# Patient Record
Sex: Female | Born: 1935 | Race: White | Hispanic: No | Smoking: Never smoker
Health system: Southern US, Community
[De-identification: ages and names within clinical notes are randomized; demographics above are authoritative.]

## PROBLEM LIST (undated history)

## (undated) DIAGNOSIS — I4891 Unspecified atrial fibrillation: Secondary | ICD-10-CM

## (undated) DIAGNOSIS — M1990 Unspecified osteoarthritis, unspecified site: Secondary | ICD-10-CM

## (undated) DIAGNOSIS — I050 Rheumatic mitral stenosis: Secondary | ICD-10-CM

## (undated) DIAGNOSIS — F32A Depression, unspecified: Secondary | ICD-10-CM

## (undated) DIAGNOSIS — K8020 Calculus of gallbladder without cholecystitis without obstruction: Secondary | ICD-10-CM

## (undated) DIAGNOSIS — I350 Nonrheumatic aortic (valve) stenosis: Secondary | ICD-10-CM

## (undated) DIAGNOSIS — I959 Hypotension, unspecified: Secondary | ICD-10-CM

## (undated) DIAGNOSIS — D649 Anemia, unspecified: Secondary | ICD-10-CM

## (undated) HISTORY — DX: Calculus of gallbladder without cholecystitis without obstruction: K80.20

## (undated) HISTORY — DX: Anemia, unspecified: D64.9

## (undated) HISTORY — DX: Unspecified atrial fibrillation: I48.91

## (undated) HISTORY — DX: Rheumatic mitral stenosis: I05.0

## (undated) HISTORY — PX: COLONOSCOPY: SHX174

## (undated) HISTORY — DX: Hypotension, unspecified: I95.9

## (undated) HISTORY — DX: Unspecified osteoarthritis, unspecified site: M19.90

## (undated) HISTORY — DX: Nonrheumatic aortic (valve) stenosis: I35.0

## (undated) HISTORY — DX: Depression, unspecified: F32.A

---

## 2003-08-14 HISTORY — PX: VAGINAL HYSTERECTOMY: SUR661

## 2006-08-13 HISTORY — PX: OTHER SURGICAL HISTORY: SHX169

## 2006-08-13 HISTORY — PX: TOTAL HIP ARTHROPLASTY: SHX124

## 2007-08-14 HISTORY — PX: HIP SURGERY: SHX245

## 2011-08-14 HISTORY — PX: MELANOMA EXCISION: SHX5266

## 2014-08-13 HISTORY — PX: ROTATOR CUFF REPAIR: SHX139

## 2014-08-13 HISTORY — PX: FRACTURE SURGERY: SHX138

## 2019-09-14 HISTORY — PX: ERCP: SHX60

## 2020-10-21 ENCOUNTER — Other Ambulatory Visit: Payer: Self-pay

## 2020-10-21 ENCOUNTER — Ambulatory Visit (INDEPENDENT_AMBULATORY_CARE_PROVIDER_SITE_OTHER): Payer: Medicare Other | Admitting: Orthopedic Surgery

## 2020-10-21 VITALS — BP 118/76 | Temp 97.5°F | Resp 20 | Ht 62.0 in | Wt 77.2 lb

## 2020-10-21 DIAGNOSIS — I480 Paroxysmal atrial fibrillation: Secondary | ICD-10-CM

## 2020-10-21 DIAGNOSIS — R2681 Unsteadiness on feet: Secondary | ICD-10-CM | POA: Diagnosis not present

## 2020-10-21 DIAGNOSIS — R636 Underweight: Secondary | ICD-10-CM

## 2020-10-21 DIAGNOSIS — I342 Nonrheumatic mitral (valve) stenosis: Secondary | ICD-10-CM

## 2020-10-21 DIAGNOSIS — R296 Repeated falls: Secondary | ICD-10-CM

## 2020-10-21 DIAGNOSIS — R630 Anorexia: Secondary | ICD-10-CM

## 2020-10-21 DIAGNOSIS — K5901 Slow transit constipation: Secondary | ICD-10-CM

## 2020-10-21 DIAGNOSIS — R5383 Other fatigue: Secondary | ICD-10-CM

## 2020-10-21 DIAGNOSIS — M545 Low back pain, unspecified: Secondary | ICD-10-CM

## 2020-10-21 DIAGNOSIS — I350 Nonrheumatic aortic (valve) stenosis: Secondary | ICD-10-CM | POA: Diagnosis not present

## 2020-10-21 DIAGNOSIS — I5042 Chronic combined systolic (congestive) and diastolic (congestive) heart failure: Secondary | ICD-10-CM

## 2020-10-21 DIAGNOSIS — G8929 Other chronic pain: Secondary | ICD-10-CM

## 2020-10-21 DIAGNOSIS — I959 Hypotension, unspecified: Secondary | ICD-10-CM

## 2020-10-21 DIAGNOSIS — D508 Other iron deficiency anemias: Secondary | ICD-10-CM

## 2020-10-21 DIAGNOSIS — R1084 Generalized abdominal pain: Secondary | ICD-10-CM

## 2020-10-21 MED ORDER — PANTOPRAZOLE SODIUM 40 MG PO TBEC: 40.0000 mg | DELAYED_RELEASE_TABLET | Freq: Two times a day (BID) | ORAL | 3 refills | Status: DC

## 2020-10-21 NOTE — Patient Instructions (Signed)
Increase Protonix to twice daily for reflux Outpatient PT ordered Please take blood pressure twice daily for 1 week I have medpass shakes if you would like Add ice cream to boost shakes  Food Choices for Gastroesophageal Reflux Disease, Adult When you have gastroesophageal reflux disease (GERD), the foods you eat and your eating habits are very important. Choosing the right foods can help ease your discomfort. Think about working with a food expert (dietitian) to help you make good choices. What are tips for following this plan? Reading food labels  Look for foods that are low in saturated fat. Foods that may help with your symptoms include: ? Foods that have less than 5% of daily value (DV) of fat. ? Foods that have 0 grams of trans fat. Cooking  Do not fry your food.  Cook your food by baking, steaming, grilling, or broiling. These are all methods that do not need a lot of fat for cooking.  To add flavor, try to use herbs that are low in spice and acidity. Meal planning  Choose healthy foods that are low in fat, such as: ? Fruits and vegetables. ? Whole grains. ? Low-fat dairy products. ? Lean meats, fish, and poultry.  Eat small meals often instead of eating 3 large meals each day. Eat your meals slowly in a place where you are relaxed. Avoid bending over or lying down until 2-3 hours after eating.  Limit high-fat foods such as fatty meats or fried foods.  Limit your intake of fatty foods, such as oils, butter, and shortening.  Avoid the following as told by your doctor: ? Foods that cause symptoms. These may be different for different people. Keep a food diary to keep track of foods that cause symptoms. ? Alcohol. ? Drinking a lot of liquid with meals. ? Eating meals during the 2-3 hours before bed.   Lifestyle  Stay at a healthy weight. Ask your doctor what weight is healthy for you. If you need to lose weight, work with your doctor to do so safely.  Exercise for at  least 30 minutes on 5 or more days each week, or as told by your doctor.  Wear loose-fitting clothes.  Do not smoke or use any products that contain nicotine or tobacco. If you need help quitting, ask your doctor.  Sleep with the head of your bed higher than your feet. Use a wedge under the mattress or blocks under the bed frame to raise the head of the bed.  Chew sugar-free gum after meals. What foods should eat? Eat a healthy, well-balanced diet of fruits, vegetables, whole grains, low-fat dairy products, lean meats, fish, and poultry. Each person is different. Foods that may cause symptoms in one person may not cause any symptoms in another person. Work with your doctor to find foods that are safe for you. The items listed above may not be a complete list of what you can eat and drink. Contact a food expert for more options.   What foods should I avoid? Limiting some of these foods may help in managing the symptoms of GERD. Everyone is different. Talk with a food expert or your doctor to help you find the exact foods to avoid, if any. Fruits Any fruits prepared with added fat. Any fruits that cause symptoms. For some people, this may include citrus fruits, such as oranges, grapefruit, pineapple, and lemons. Vegetables Deep-fried vegetables. [place] fries. Any vegetables prepared with added fat. Any vegetables that cause symptoms. For some people,  this may include tomatoes and tomato products, chili peppers, onions and garlic, and horseradish. Grains Pastries or quick breads with added fat. Meats and other proteins High-fat meats, such as fatty beef or pork, hot dogs, ribs, ham, sausage, salami, and bacon. Fried meat or protein, including fried fish and fried chicken. Nuts and nut butters, in large amounts. Dairy Whole milk and chocolate milk. Sour cream. Cream. Ice cream. Cream cheese. Milkshakes. Fats and oils Butter. Margarine. Shortening. Ghee. Beverages Coffee and tea, with or  without caffeine. Carbonated beverages. Sodas. Energy drinks. Fruit juice made with acidic fruits, such as orange or grapefruit. Tomato juice. Alcoholic drinks. Sweets and desserts Chocolate and cocoa. Donuts. Seasonings and condiments Pepper. Peppermint and spearmint. Added salt. Any condiments, herbs, or seasonings that cause symptoms. For some people, this may include curry, hot sauce, or vinegar-based salad dressings. The items listed above may not be a complete list of what you should not eat and drink. Contact a food expert for more options. Questions to ask your doctor Diet and lifestyle changes are often the first steps that are taken to manage symptoms of GERD. If diet and lifestyle changes do not help, talk with your doctor about taking medicines. Where to find more information  International Foundation for Gastrointestinal Disorders: aboutgerd.org Summary  When you have GERD, food and lifestyle choices are very important in easing your symptoms.  Eat small meals often instead of 3 large meals a day. Eat your meals slowly and in a place where you are relaxed.  Avoid bending over or lying down until 2-3 hours after eating.  Limit high-fat foods such as fatty meats or fried foods. This information is not intended to replace advice given to you by your health care provider. Make sure you discuss any questions you have with your health care provider. Document Revised: 02/08/2020 Document Reviewed: 02/08/2020 Elsevier Patient Education  2021 [employer].
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
## 2020-10-22 LAB — CBC WITH DIFFERENTIAL/PLATELET
Absolute Monocytes: 429 cells/uL (ref 200–950)
Basophils Absolute: 33 cells/uL (ref 0–200)
Basophils Relative: 0.5 %
Eosinophils Absolute: 33 cells/uL (ref 15–500)
Eosinophils Relative: 0.5 %
HCT: 32.2 % — ABNORMAL LOW (ref 35.0–45.0)
Hemoglobin: 10.9 g/dL — ABNORMAL LOW (ref 11.7–15.5)
Lymphs Abs: 1001 cells/uL (ref 850–3900)
MCH: 31.3 pg (ref 27.0–33.0)
MCHC: 33.9 g/dL (ref 32.0–36.0)
MCV: 92.5 fL (ref 80.0–100.0)
MPV: 11.4 fL (ref 7.5–12.5)
Monocytes Relative: 6.6 %
Neutro Abs: 5005 cells/uL (ref 1500–7800)
Neutrophils Relative %: 77 %
Platelets: 179 10*3/uL (ref 140–400)
RBC: 3.48 10*6/uL — ABNORMAL LOW (ref 3.80–5.10)
RDW: 13.9 % (ref 11.0–15.0)
Total Lymphocyte: 15.4 %
WBC: 6.5 10*3/uL (ref 3.8–10.8)

## 2020-10-22 LAB — BASIC METABOLIC PANEL
BUN/Creatinine Ratio: 25 (calc) — ABNORMAL HIGH (ref 6–22)
BUN: 25 mg/dL (ref 7–25)
CO2: 26 mmol/L (ref 20–32)
Calcium: 8.6 mg/dL (ref 8.6–10.4)
Chloride: 97 mmol/L — ABNORMAL LOW (ref 98–110)
Creat: 1.02 mg/dL — ABNORMAL HIGH (ref 0.60–0.88)
Glucose, Bld: 164 mg/dL — ABNORMAL HIGH (ref 65–139)
Potassium: 4 mmol/L (ref 3.5–5.3)
Sodium: 136 mmol/L (ref 135–146)

## 2020-10-25 ENCOUNTER — Telehealth: Payer: Self-pay | Admitting: *Deleted

## 2020-10-25 ENCOUNTER — Encounter: Payer: Self-pay | Admitting: Orthopedic Surgery

## 2020-10-25 DIAGNOSIS — R630 Anorexia: Secondary | ICD-10-CM | POA: Insufficient documentation

## 2020-10-25 DIAGNOSIS — I342 Nonrheumatic mitral (valve) stenosis: Secondary | ICD-10-CM | POA: Insufficient documentation

## 2020-10-25 DIAGNOSIS — D508 Other iron deficiency anemias: Secondary | ICD-10-CM | POA: Insufficient documentation

## 2020-10-25 DIAGNOSIS — R5383 Other fatigue: Secondary | ICD-10-CM | POA: Insufficient documentation

## 2020-10-25 DIAGNOSIS — R1084 Generalized abdominal pain: Secondary | ICD-10-CM | POA: Insufficient documentation

## 2020-10-25 DIAGNOSIS — I959 Hypotension, unspecified: Secondary | ICD-10-CM | POA: Insufficient documentation

## 2020-10-25 DIAGNOSIS — K5901 Slow transit constipation: Secondary | ICD-10-CM | POA: Insufficient documentation

## 2020-10-25 DIAGNOSIS — I5042 Chronic combined systolic (congestive) and diastolic (congestive) heart failure: Secondary | ICD-10-CM | POA: Insufficient documentation

## 2020-10-25 DIAGNOSIS — R636 Underweight: Secondary | ICD-10-CM | POA: Insufficient documentation

## 2020-10-25 DIAGNOSIS — G8929 Other chronic pain: Secondary | ICD-10-CM | POA: Insufficient documentation

## 2020-10-25 DIAGNOSIS — R296 Repeated falls: Secondary | ICD-10-CM | POA: Insufficient documentation

## 2020-10-25 DIAGNOSIS — I480 Paroxysmal atrial fibrillation: Secondary | ICD-10-CM | POA: Insufficient documentation

## 2020-10-25 DIAGNOSIS — R2681 Unsteadiness on feet: Secondary | ICD-10-CM | POA: Insufficient documentation

## 2020-10-25 DIAGNOSIS — I350 Nonrheumatic aortic (valve) stenosis: Secondary | ICD-10-CM | POA: Insufficient documentation

## 2020-10-25 NOTE — Telephone Encounter (Signed)
Judeth Cornfield, daughter called and stated that she was returning your call regarding some questions you had with the Hospital Bed and 3n1 toilet.  Daughter stated that you can call her at #[phone]

## 2020-10-26 ENCOUNTER — Telehealth: Payer: Self-pay

## 2020-10-26 NOTE — Telephone Encounter (Signed)
Drinda Butts from [employer] [facility] called to ask for the after visit summary which the patient was seen for and the need for a hospital bed was discussed and the order was placed I printed the Visit Summary and faxed it to [facility]  Fax# [phone] which was left on the voice message I listen too

## 2020-10-27 ENCOUNTER — Telehealth: Payer: Self-pay | Admitting: *Deleted

## 2020-10-27 NOTE — Telephone Encounter (Signed)
Dusty with [facility] [phone] called in regards to the Hospital Bed and stated that your OV note needs to include a Narrative that states:  "Patient requires frequent repositioning of the body in ways that Cannot be achieved with a standard bed or bed wedge."   Needs it faxed to [facility] Fax: [phone]

## 2020-10-27 NOTE — Telephone Encounter (Signed)
Updated notes faxed to [facility] Fax: [phone]

## 2020-10-27 NOTE — Telephone Encounter (Signed)
Received fax letter from daughter Aralyn Nowak #[phone] requesting a Health Care Provider Certification to be filled out for her job to help care for patient (form attached).  Needs to be filled out to allow her to take days off work intermittently to help take care of, assist, hospital visits and attend doctor's appointments for a period of one year for patient (mother). Wants filled out and emailed back to her at [email or web address]  Placed paperwork in Amy's Folder to review, fill out and sign.   [facility] Fax: [phone] #:[phone] Case ID: [id number] TC

## 2020-10-27 NOTE — Telephone Encounter (Signed)
Note amended to include phrase provided- you may re fax to them- thank you for your help!

## 2020-10-31 ENCOUNTER — Telehealth: Payer: Self-pay | Admitting: *Deleted

## 2020-10-31 NOTE — Telephone Encounter (Signed)
Talked with daughter Judeth Cornfield about restarting Megace and trying high calorie foods. Treatment options also discussed. Advised to notify PCP if interventions are unsuccessful.

## 2020-10-31 NOTE — Telephone Encounter (Signed)
Patient called and stated that patient has been having trouble eating over that last 4 days. Stated that doubling the Prilosec doesn't seem to be working. Stated that she can't keep anything down except Boost and Applesauce. Daughter stated that patient looks forward to eating and wants to eat but can't.  Daughter is wondering if this is stemming from her Gallbladder.  Wants to know if you have anymore recommendations.  Please Advise.

## 2020-11-02 DIAGNOSIS — Z029 Encounter for administrative examinations, unspecified: Secondary | ICD-10-CM

## 2020-11-03 NOTE — Telephone Encounter (Signed)
Daughter, Selena Batten, called and left message on Clinical intake and stated that she was calling to check the status of her paperwork.  Stated that it was due today and was wondering if you could fax it back to her work today.

## 2020-11-03 NOTE — Telephone Encounter (Signed)
I faxed paperwork this AM to [facility].

## 2020-11-03 NOTE — Telephone Encounter (Signed)
Received paperwork. Called and spoke with daughter Selena Batten and she requested paperwork to be faxed to Fax: [phone]. Faxed.  Call transferred to [facility] to collect the $25 form charge.   Copy sent to scanning.

## 2020-11-04 ENCOUNTER — Ambulatory Visit (INDEPENDENT_AMBULATORY_CARE_PROVIDER_SITE_OTHER): Payer: Medicare Other | Admitting: Internal Medicine

## 2020-11-04 ENCOUNTER — Encounter: Payer: Self-pay | Admitting: Internal Medicine

## 2020-11-04 ENCOUNTER — Other Ambulatory Visit: Payer: Self-pay

## 2020-11-04 VITALS — BP 116/60 | HR 69 | Ht 62.0 in | Wt 75.2 lb

## 2020-11-04 DIAGNOSIS — I509 Heart failure, unspecified: Secondary | ICD-10-CM | POA: Diagnosis not present

## 2020-11-04 NOTE — Progress Notes (Addendum)
Cardiology Office Note:    Date:  11/04/2020   ID:  Jaclyn Berry, DOB 1927-01-29, MRN [id number]  PCP:  [name], NP   [facility]  Cardiologist:  [name], MD  Advanced Practice Provider:  No care team member to display Electrophysiologist:  None       CC: Establishing care Consulted for the evaluation of PAF at the behest of [place], [place] E, NP  History of Present Illness:    Jaclyn Berry is a 87 y.o. 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  Patient notes that she is feeling week.  Has had no chest pain, chest pressure, chest tightness, chest stinging. Doesn't remember prior ischemic work up but only not AF and valve issues. The discomfort patient feels is dysphagia that occurs with even small amounts of food.  Patient exertion is minimal in the setting of significant deconditioning.  No shortness of breath, DOE on her home bumex.  No PND or orthopnea. No syncope or near syncope. Notes  no palpitations or funny heart beats her her atrial fibrillation  Patient reports prior cardiac testing including many prios echos, negative stress test in the past, denies poast heart catheterizations,  cardioversion, or ablations.   Past Medical History:  Diagnosis Date  . A-fib (HCC)   . Anemia   . Aortic valve stenosis   . Gall stones   . Low BP   . Mitral valve stenosis     Past Surgical History:  Procedure Laterality Date  . FRACTURE SURGERY  2016   Broken Arm  . HIP SURGERY  2009   Replacement repaired    . MELANOMA EXCISION  2013   right leg  . mirtral valve repair  2008  .  ROTATOR CUFF REPAIR  2016  . TOTAL HIP ARTHROPLASTY  2008  . VAGINAL HYSTERECTOMY  2005    Current Medications: Current Meds  Medication Sig  . acetaminophen (TYLENOL) 650 MG CR tablet Take 650 mg by mouth every 12 (twelve) hours as needed for pain.  Marland Kitchen amitriptyline (ELAVIL) 50 MG tablet Take 25 mg by mouth at bedtime.  Marland Kitchen aspirin EC 81 MG tablet Take 81 mg by mouth daily. Swallow whole.  . bumetanide (BUMEX) 1 MG tablet Take 1 mg by mouth daily.  . digoxin (LANOXIN) 0.125 MG tablet Take 0.125 mg by mouth daily.  . ferrous sulfate 325 (65 FE) MG tablet Take 325 mg by mouth daily with breakfast.  . HYDROcodone-acetaminophen (NORCO) 7.5-325 MG tablet Take 1 tablet by mouth as needed.  . megestrol (MEGACE) 40 MG/ML suspension Take by mouth daily. Take 10 ml by mouth daily  . mirtazapine (REMERON) 30 MG tablet Take 30 mg by mouth at bedtime.  . Multiple Vitamins-Minerals (PRESERVISION AREDS 2+MULTI VIT PO) Take 1 tablet by mouth at bedtime.  . ondansetron (ZOFRAN) 4 MG tablet Take 4 mg by mouth 3 (three) times daily as needed for nausea or vomiting.  . pantoprazole (PROTONIX) 40 MG tablet Take 1 tablet (40 mg total) by mouth 2 (two) times daily.  . potassium chloride (KLOR-CON) 20 MEQ packet Take 20 mEq by mouth  daily.  . senna-docusate (SENOKOT-S) 8.6-50 MG tablet Take 2 tablets by mouth daily.  . sildenafil (REVATIO) 20 MG tablet Take 20 mg by mouth 2 (two) times daily.  . traMADol (ULTRAM) 50 MG tablet Take 50 mg by mouth daily.  . vitamin B-12 (CYANOCOBALAMIN) 1000 MCG tablet Take 1,000 mcg by mouth daily.     Allergies:   Morphine and related and Sulfa antibiotics   Social History   Socioeconomic History  . Marital status: Widowed    Spouse name: Not on file  . Number of children: Not on file  . Years of education: Not on file  . Highest education level: Not on file  Occupational History  . Not on file  Tobacco Use  . Smoking status: Never Smoker  . Smokeless tobacco: Never  Used  Vaping Use  . Vaping Use: Never used  Substance and Sexual Activity  . Alcohol use: Never  . Drug use: Never  . Sexual activity: Not on file  Other Topics Concern  . Not on file  Social History Narrative   Diet: Regular      Do you drink/ eat things with caffeine? No always drank coffee but not now      Marital status:  Widowed                              What year were you married ? 1954      Do you live in a house, apartment,assistred living, condo, trailer, etc.)? House      Is it one or more stories? 2      How many persons live in your home ? 4      Do you have any pets in your home ?(please list) Dog, Cat      Highest Level of education completed: High School      Current or past profession: Book Publishing rights manager       Do you exercise?  No                            Type & how often ------------      ADVANCED DIRECTIVES (Please bring copies)      Do you have a living will? Yes      Do you have a DNR form? Yes                      If not, do you want to discuss one?       Do you have signed POA?HPOA forms?   Yes              If so, please bring to your appointment      FUNCTIONAL STATUS- To be completed by Spouse / child / Staff       Do you have difficulty bathing or dressing yourself ? Yes      Do you have difficulty preparing food or eating ? Yes      Do you have difficulty managing your mediation ? Yes      Do you have difficulty managing your finances ? Yes      Do you have difficulty affording your medication ? No      Social Determinants of Health   Financial Resource Strain: Not on file  Food Insecurity: Not on file  Transportation Needs: Not on file  Physical Activity: Not  on file  Stress: Not on file  Social Connections: Not on file     Family History: The patient's family history includes COPD in her mother; Emphysema in her mother; Stroke in her father.  ROS:   Please see the history of present illness.     All  other systems reviewed and are negative.  EKGs/Labs/Other Studies Reviewed:    The following studies were reviewed today:  EKG:  EKG is  ordered today.  The ekg ordered today demonstrates  11/04/20"  AF with PVCs rate controlled at ~ 70  Recent Labs: 10/21/2020: BUN 25; Creat 1.02; Hemoglobin 10.9; Platelets 179; Potassium 4.0; Sodium 136  Recent Lipid Panel No results found for: CHOL, TRIG, HDL, CHOLHDL, VLDL, LDLCALC, LDLDIRECT  Addendum OSH: Outside Hospital  or Outside Clinic Studies (OSH):  Date: 10/02/19 Cholesterol 130 HDL 72 LDL 40 Tgs 90 Dig level 1.0 (10/07/19) Confirmed 10/02/19 admission was for rhabdo   Risk Assessment/Calculations:    CHA2DS2-VASc Score = 4  This indicates a 4.8% annual risk of stroke. The patient's score is based upon: CHF History: Yes HTN History: No Diabetes History: No Stroke History: No Vascular Disease History: No Age Score: 2 Gender Score: 1      Physical Exam:    VS:  BP 116/60   Pulse 69   Ht 5\' 2"  (1.575 m)   Wt 75 lb 3.2 oz (34.1 kg)   BMI 13.75 kg/m     Wt Readings from Last 3 Encounters:  11/04/20 75 lb 3.2 oz (34.1 kg)  10/21/20 77 lb 3.2 oz (35 kg)     ZOX:WRUEA, elderly woman in no acute distress HEENT: Normal NECK: No JVD; No carotid bruits LYMPHATICS: No lymphadenopathy CARDIAC: Irregularly Irregular, no murmurs, rubs, gallops RESPIRATORY:  Clear to auscultation without rales, wheezing or rhonchi  ABDOMEN: Soft, non-tender, non-distended MUSCULOSKELETAL:  No edema; No deformity  SKIN: Warm and dry NEUROLOGIC:  Alert and oriented x 3 PSYCHIATRIC:  Normal affect   ASSESSMENT:    1. Heart failure, type unknown (HCC)    PLAN:    In order of problems listed above:  Heart Failure with Reduced Ejection Fraction Mitral Valve Regurgitation s/p Repair Permanent Atrial fibrillation Aortic Stenosis NOS Dysphagia, Frailty, and Falls - will get echocardiogram to establish baseline - will get BMP given  diuretic, K supplementation, and Bumex; no evidence of Dig Toxicity but has a low threshold to get level as well (patient has been on her current dig for 14 years) - continue ASA for now; given CHADSVASC of 4 at a minimum;  Would be appropriate for eliquis 2.5 mg PO BID; discussed this with family; they will consider this and we will re-engage with patient and family at next visit  Will get old records from:  Dr. Erroll [place]  Summer follow up  Medication Adjustments/Labs and Tests Ordered: Current medicines are reviewed at length with the patient today.  Concerns regarding medicines are outlined above.  Orders Placed This Encounter  Procedures  . Basic metabolic panel  . Pro b natriuretic peptide (BNP)  . EKG 12-Lead  . ECHOCARDIOGRAM COMPLETE   No orders of the defined types were placed in this encounter.   Patient Instructions  Medication Instructions:  Your physician recommends that you continue on your current medications as directed. Please refer to the Current Medication list given to you today.  *If you need a refill on your cardiac medications before your next appointment, please  call your pharmacy*   Lab Work: TODAY: BNP and BMP If you have labs (blood work) drawn today and your tests are completely normal, you will receive your results only by: Marland Kitchen MyChart Message (if you have MyChart) OR . A paper copy in the mail If you have any lab test that is abnormal or we need to change your treatment, we will call you to review the results.   Testing/Procedures: Your physician has requested that you have an echocardiogram. Echocardiography is a painless test that uses sound waves to create images of your heart. It provides your doctor with information about the size and shape of your heart and how well your heart's chambers and valves are working. This procedure takes approximately one hour. There are no restrictions for this  procedure.     Follow-Up: At [facility], you and your health needs are our priority.  As part of our continuing mission to provide you with exceptional heart care, we have created designated Provider Care Teams.  These Care Teams include your primary Cardiologist (physician) and Advanced Practice Providers (APPs -  Physician Assistants and Nurse Practitioners) who all work together to provide you with the care you need, when you need it.  We recommend signing up for the patient portal called "MyChart".  Sign up information is provided on this After Visit Summary.  MyChart is used to connect with patients for Virtual Visits (Telemedicine).  Patients are able to view lab/test results, encounter notes, upcoming appointments, etc.  Non-urgent messages can be sent to your provider as well.   To learn more about what you can do with MyChart, go to [email or web address].    Your next appointment:   5-7 month(s)  The format for your next appointment:   In Person  Provider:   You may see Riley Lam, MD or one of the following Advanced Practice Providers on your designated Care Team:    Ronie Spies, PA-C  Jacolyn Reedy, PA-C          Signed, [name], MD  11/04/2020 1:10 PM    [facility]

## 2020-11-04 NOTE — Patient Instructions (Signed)
Medication Instructions:  Your physician recommends that you continue on your current medications as directed. Please refer to the Current Medication list given to you today.  *If you need a refill on your cardiac medications before your next appointment, please call your pharmacy*   Lab Work: TODAY: BNP and BMP If you have labs (blood work) drawn today and your tests are completely normal, you will receive your results only by: Marland Kitchen MyChart Message (if you have MyChart) OR . A paper copy in the mail If you have any lab test that is abnormal or we need to change your treatment, we will call you to review the results.   Testing/Procedures: Your physician has requested that you have an echocardiogram. Echocardiography is a painless test that uses sound waves to create images of your heart. It provides your doctor with information about the size and shape of your heart and how well your heart's chambers and valves are working. This procedure takes approximately one hour. There are no restrictions for this procedure.     Follow-Up: At [facility], you and your health needs are our priority.  As part of our continuing mission to provide you with exceptional heart care, we have created designated Provider Care Teams.  These Care Teams include your primary Cardiologist (physician) and Advanced Practice Providers (APPs -  Physician Assistants and Nurse Practitioners) who all work together to provide you with the care you need, when you need it.  We recommend signing up for the patient portal called "MyChart".  Sign up information is provided on this After Visit Summary.  MyChart is used to connect with patients for Virtual Visits (Telemedicine).  Patients are able to view lab/test results, encounter notes, upcoming appointments, etc.  Non-urgent messages can be sent to your provider as well.   To learn more about what you can do with MyChart, go to [email or web address].    Your next appointment:    5-7 month(s)  The format for your next appointment:   In Person  Provider:   You may see Riley Lam, MD or one of the following Advanced Practice Providers on your designated Care Team:    Ronie Spies, PA-C  Jacolyn Reedy, PA-C

## 2020-11-05 LAB — BASIC METABOLIC PANEL
BUN/Creatinine Ratio: 27 (ref 12–28)
BUN: 29 mg/dL (ref 10–36)
CO2: 24 mmol/L (ref 20–29)
Calcium: 8.9 mg/dL (ref 8.7–10.3)
Chloride: 93 mmol/L — ABNORMAL LOW (ref 96–106)
Creatinine, Ser: 1.07 mg/dL — ABNORMAL HIGH (ref 0.57–1.00)
Glucose: 120 mg/dL — ABNORMAL HIGH (ref 65–99)
Potassium: 4.4 mmol/L (ref 3.5–5.2)
Sodium: 137 mmol/L (ref 134–144)
eGFR: 49 mL/min/{1.73_m2} — ABNORMAL LOW (ref >59)

## 2020-11-05 LAB — PRO B NATRIURETIC PEPTIDE: NT-Pro BNP: 6807 pg/mL — ABNORMAL HIGH (ref 0–738)

## 2020-11-07 ENCOUNTER — Ambulatory Visit: Payer: Medicare Other | Attending: Orthopedic Surgery | Admitting: Physical Therapy

## 2020-11-07 ENCOUNTER — Other Ambulatory Visit: Payer: Self-pay

## 2020-11-07 ENCOUNTER — Encounter: Payer: Self-pay | Admitting: Physical Therapy

## 2020-11-07 DIAGNOSIS — R2689 Other abnormalities of gait and mobility: Secondary | ICD-10-CM | POA: Diagnosis present

## 2020-11-07 DIAGNOSIS — R293 Abnormal posture: Secondary | ICD-10-CM | POA: Insufficient documentation

## 2020-11-07 DIAGNOSIS — M6281 Muscle weakness (generalized): Secondary | ICD-10-CM | POA: Diagnosis present

## 2020-11-07 NOTE — Patient Instructions (Signed)
Access Code: [email or web address] URL: [email or web address] Date: 11/07/2020 Prepared by: Loistine Simas Yehonatan Grandison  Exercises Sit to Stand with Counter Support - 3 x daily - 7 x weekly - 1 sets - 5 reps Side to Side Weight Shift with Counter Support - 3 x daily - 7 x weekly - 1 sets - 10 reps Staggered Stance Forward Backward Weight Shift with Counter Support - 3 x daily - 7 x weekly - 1 sets - 10 reps Seated Long Arc Quad - 3 x daily - 7 x weekly - 1 sets - 10 reps Seated Gluteal Sets - 3 x daily - 7 x weekly - 1 sets - 10 reps - 3 hold Seated Heel Toe Raises - 3 x daily - 7 x weekly - 2 sets - 10 reps Seated Scapular Retraction - 3 x daily - 7 x weekly - 1 sets - 10 reps

## 2020-11-07 NOTE — Therapy (Signed)
[facility] [address] [place], [place], [address] Phone: [phone]   Fax:  [phone]  Physical Therapy Evaluation  Patient Details  Name: Jaclyn Berry MRN: [id number] Date of Birth: 1927/06/11 Referring Provider (PT): [name], NP   Encounter Date: 11/07/2020   PT End of Session - 11/07/20 1238    Visit Number 1    Date for PT Re-Evaluation 01/02/21    Authorization Type Medicare Part A and B, KX at 15 visits    Progress Note Due on Visit 10    PT Start Time 1105    PT Stop Time 1150    PT Time Calculation (min) 45 min    Equipment Utilized During Treatment Gait belt    Activity Tolerance Patient limited by fatigue;Patient limited by lethargy;Patient limited by pain    Behavior During Therapy [facility] for tasks assessed/performed           Past Medical History:  Diagnosis Date  . A-fib (HCC)   . Anemia   . Aortic valve stenosis   . Gall stones   . Low BP   . Mitral valve stenosis     Past Surgical History:  Procedure Laterality Date  . FRACTURE SURGERY  2016   Broken Arm  . HIP SURGERY  2009   Replacement repaired    . MELANOMA EXCISION  2013   right leg  . mirtral valve repair  2008  . ROTATOR CUFF REPAIR  2016  . TOTAL HIP ARTHROPLASTY  2008  . VAGINAL HYSTERECTOMY  2005    There were no vitals filed for this visit.    Subjective Assessment - 11/07/20 1105    Subjective Pt is referred to OPPT with history of falls and unstable gait.  She is accompanied by her daughter with whom she lives.  Pt fell 1 year ago and was on floor x 23 hours, went into hospital x 20+ days.  Has been living with sister but now lives with daughter.  No history of recent falls.  Has been having trouble eating so weakness due to weight loss has set in.    Pertinent History PMH: h/o fall, Rt hip replacement with revision after a fall (2008/2009) with ongoing pain since femur fraction/revision, a-fib, reduced  ejection fraction    Patient Stated Goals be more independent with bed mobility, work on gait with RW, transfers, strength              [facility] PT Assessment - 11/07/20 0001      Assessment   Medical Diagnosis R26.81 (ICD-10-CM) - Unstable gait    Referring Provider (PT) Coletta Memos, Amy E, NP    Onset Date/Surgical Date --   last year since fall   Hand Dominance Right    Next MD Visit 1 month    Prior Therapy no      Precautions   Precautions Fall    Precaution Comments reduced body weight due to low caloric intake      Restrictions   Weight Bearing Restrictions No      Balance Screen   Has the patient fallen in the past 6 months No    Has the patient had a decrease in activity level because of a fear of falling?  Yes    Is the patient reluctant to leave their home because of a fear of falling?  Yes      Home Environment   Living Environment Private residence    Living  Arrangements Children;Other relatives   grandkids   Type of Home House    Home Access Stairs to enter    Entrance Stairs-Number of Steps 3    Entrance Stairs-Rails None    Home Layout Able to live on main level with bedroom/bathroom    Home Equipment Hospital bed;Walker - 4 wheels;Wheelchair - manual;Grab bars - toilet;Toilet riser;Shower seat      Prior Function   Level of Independence Requires assistive device for independence;Needs assistance with ADLs;Needs assistance with gait;Needs assistance with transfers    Vocation Retired      Cognition   Overall Cognitive Status Within Functional Limits for tasks assessed      Observation/Other Assessments   Observations underweight, rounded posture in W/C      Posture/Postural Control   Posture/Postural Control Postural limitations    Postural Limitations Rounded Shoulders;Forward head;Increased thoracic kyphosis;Flexed trunk;Posterior pelvic tilt      ROM / Strength   AROM / PROM / Strength AROM;Strength      AROM   Overall AROM Comments signif trunk  flexion in standing, not formally assessed secondary to global weakness, WFL for sit to stand, bed mobility and gait with RW      Strength   Overall Strength Comments unable to flex Rt hip in sitting secondary to past fracture/THR revision, otherwise LE strength 3+/5 bil      Bed Mobility   Bed Mobility Rolling Right;Rolling Left;Supine to Sit;Sit to Supine    Rolling Right Minimal Assistance - Patient > 75%    Rolling Left Minimal Assistance - Patient > 75%    Supine to Sit Moderate Assistance - Patient 50-74%    Sit to Supine Moderate Assistance - Patient 50-74%      Transfers   Transfers Sit to Stand;Stand to Sit    Sit to Stand 5: Supervision;4: Min guard    Stand to Sit 6: Modified independent (Device/Increase time);5: Supervision      Ambulation/Gait   Ambulation/Gait Yes    Ambulation/Gait Assistance 6: Modified independent (Device/Increase time);5: Supervision    Ambulation Distance (Feet) 10 Feet    Assistive device Rolling walker    Gait Pattern Decreased weight shift to right;Decreased hip/knee flexion - right;Decreased step length - left;Decreased stance time - right;Decreased step length - right;Right flexed knee in stance;Trunk flexed                      Objective measurements completed on examination: See above findings.       [facility] Adult PT Treatment/Exercise - 11/07/20 0001      Self-Care   Self-Care Other Self-Care Comments    Other Self-Care Comments  HEP issued, discussed goal of 3x/day effort to rise from recliner or hospital bed to [facility] pelvic pressure areas and work on seated and standing HEP as able, allow Pt to guide reps/breaks as needed for SOB or fatigue                  PT Education - 11/07/20 1149    Education Details Access Code: [email or web address]    Person(s) Educated Patient;Child(ren)   Pt's daughter   Methods Explanation;Handout;Demonstration    Comprehension Verbalized understanding;Returned demonstration             PT Short Term Goals - 11/07/20 1252      PT SHORT TERM GOAL #1   Title Pt will be able to perform intial HEP with supervision for safety    Time 3  Period Weeks    Status New    Target Date 11/28/20      PT SHORT TERM GOAL #2   Title Pt will be able to demo bed rolling Lt/Rt with supervision using proper form and muscle groups    Baseline min assist    Time 3    Period Weeks    Status New    Target Date 11/28/20      PT SHORT TERM GOAL #3   Title Pt will report at least 30% greater ease and confidence with bed mobility and transfers with supervision/min assist as needed    Time 4    Period Weeks    Status New    Target Date 12/05/20      PT SHORT TERM GOAL #4   Title Pt will be able to ambulate at least 20' using RW with min/mod SOB and recovery within 1' of sitting    Time 4    Period Weeks    Status New    Target Date 12/05/20             PT Long Term Goals - 11/07/20 1255      PT LONG TERM GOAL #1   Title Pt will improve functional strength to allow for ind/supervision only with bed mobility and chair transfers    Time 8    Period Weeks    Status New    Target Date 01/02/21      PT LONG TERM GOAL #2   Title Pt will be able to demo coordination of step ups and downs with caregiver or PT min assist to simulate entry stairs access (4 stairs no railing) for improved safety.    Time 8    Period Weeks    Status New    Target Date 01/02/21      PT LONG TERM GOAL #3   Title Pt will demo improved awareness of more erect posture with less need for cueing during ther ex for spine posture.    Time 8    Period Weeks    Status New    Target Date 01/02/21      PT LONG TERM GOAL #4   Title Pt will be able to ambulate at least 40' with RW and brief standing or sitting break as needed for SOB secondary to heart condition to demo improved gait endurance for household amb (with supervision)    Time 8    Period Weeks    Status New    Target Date 01/02/21                   Plan - 11/07/20 1239    Clinical Impression Statement Pt is a pleasant 87yo female who lives with her adult daughter's family who provide assistance and care for her.  Pt has a history of a fall 1 year ago after which she was in inpatient rehab x 20+ days.  In Dec she lost her appetite and has lost weight and strength since then, currently weighing approx 75lb.  She recently moved in with her daughter's family as she needs assistance with all ADLs, transfers, gait.  She lives on main level in hospital bed or recliner and has rollator for supervised/assisted gait.  They have a manual W/C for community use.  She presents with flexed trunk, forward head and rounded shoulders.  She needs close supervision for sit to stand to walker, and min/mod assist with all bed mobility today.  Strength is 3+/5  in bil LEs.  UE and trunk strength is 2+/5, contributing to difficulty with bed mobility.  She has afib and reduced ejection fraction so became easily fatigued and SOB during assessment.  Functional assessmnet only today vs formal ROM and strength testing secondary to Pt condition.  Pt was able to demo gait with RW x 10 feet with trunk flexed, Rt knee flexed, decreased Rt step length, decreased weight shift into Rt LE (history of Rt THR w/ femur fracture revision history that has left Rt hip painful and weak with changed mechanics).  Pt's daugther participated in session for history assistance.  PT practiced bed mobility and transfers with Pt today using min-mod assist x 1.  PT issued initial HEP to try short bouts of exercise during the day when supervision and assistance allows.  Pt will benefit from skilled PT to improve functional strength and maximize function and safety with daily tasks and self-care.    Personal Factors and Comorbidities Age;Time since onset of injury/illness/exacerbation;Comorbidity 1;Comorbidity 2    Comorbidities a-fib, Rt THR with femur fracture revision     Examination-Activity Limitations Bathing;Transfers;Locomotion Level;Bed Mobility;Bend;Sit;Stairs;Stand;Toileting;Hygiene/Grooming;Dressing    Examination-Participation Restrictions Community Activity;Other   needs assist with all activities in and out of home   Stability/Clinical Decision Making Stable/Uncomplicated    Clinical Decision Making Low    Rehab Potential Good    PT Frequency 2x / week    PT Duration 8 weeks    PT Treatment/Interventions ADLs/Self Care Home Management;Moist Heat;Gait training;Stair training;Functional mobility training;Therapeutic activities;Therapeutic exercise;Neuromuscular re-education;Manual techniques;Patient/family education;Passive range of motion;Balance training    PT Next Visit Plan f/u on HEP tolerance and review, practice bed mobility, chair transfers to walker, short distance ambulation w/ RW, add seated row yellow to HEP    PT Home Exercise Plan Access Code: [email or web address]    Consulted and Agree with Plan of Care Patient;Family member/caregiver    Family Member Consulted Pt's daughter           Patient will benefit from skilled therapeutic intervention in order to improve the following deficits and impairments:  Abnormal gait,Decreased range of motion,Difficulty walking,Cardiopulmonary status limiting activity,Postural dysfunction,Decreased strength,Decreased mobility,Decreased balance,Improper body mechanics,Pain,Decreased activity tolerance  Visit Diagnosis: Muscle weakness (generalized) - Plan: PT plan of care cert/re-cert  Other abnormalities of gait and mobility - Plan: PT plan of care cert/re-cert  Abnormal posture - Plan: PT plan of care cert/re-cert     Problem List Patient Active Problem List   Diagnosis Date Noted  . Unstable gait 10/25/2020  . Paroxysmal atrial fibrillation (HCC) 10/25/2020  . Nonrheumatic mitral valve stenosis 10/25/2020  . Nonrheumatic aortic valve stenosis 10/25/2020  . Fatigue 10/25/2020  . Generalized abdominal  pain 10/25/2020  . Iron deficiency anemia secondary to inadequate dietary iron intake 10/25/2020  . Hypotension 10/25/2020  . Chronic combined systolic and diastolic heart failure (HCC) 10/25/2020  . Chronic bilateral low back pain without sciatica 10/25/2020  . Multiple falls 10/25/2020  . Slow transit constipation 10/25/2020  . Poor appetite 10/25/2020  . Underweight due to inadequate caloric intake 10/25/2020    Novalie Leamy, PT 11/07/20 1:01 PM   [facility] [address] [place], [place], [address] Phone: [phone]   Fax:  [phone]  Name: Jaclyn Berry MRN: [id number] Date of Birth: 1926/12/28

## 2020-11-11 ENCOUNTER — Other Ambulatory Visit: Payer: Self-pay

## 2020-11-11 MED ORDER — SILDENAFIL CITRATE 20 MG PO TABS: 20.0000 mg | ORAL_TABLET | Freq: Two times a day (BID) | ORAL | 1 refills | Status: AC

## 2020-11-11 MED ORDER — TRAMADOL HCL 50 MG PO TABS: 50.0000 mg | ORAL_TABLET | Freq: Every day | ORAL | 0 refills | Status: DC

## 2020-11-11 NOTE — Telephone Encounter (Signed)
New patient needing refills on   1.) Tramadol- [facility], controlled substance sent to [name], NP to review and approve  2.) Sildenafil to Karin Golden at [facility], request complete

## 2020-11-16 ENCOUNTER — Ambulatory Visit: Payer: Medicare Other | Attending: Orthopedic Surgery

## 2020-11-16 ENCOUNTER — Other Ambulatory Visit: Payer: Self-pay

## 2020-11-16 DIAGNOSIS — R2689 Other abnormalities of gait and mobility: Secondary | ICD-10-CM | POA: Insufficient documentation

## 2020-11-16 DIAGNOSIS — R293 Abnormal posture: Secondary | ICD-10-CM | POA: Insufficient documentation

## 2020-11-16 DIAGNOSIS — M6281 Muscle weakness (generalized): Secondary | ICD-10-CM | POA: Diagnosis present

## 2020-11-16 NOTE — Therapy (Signed)
[facility] [address] [place], [place], [address] Phone: [phone]   Fax:  [phone]  Physical Therapy Treatment  Patient Details  Name: Jaclyn Berry MRN: [id number] Date of Birth: [birth year] Referring Provider (PT): [name], NP   Encounter Date: 11/16/2020   PT End of Session - 11/16/20 1313    Visit Number 2    Date for PT Re-Evaluation 01/02/21    Authorization Type Medicare Part A and B, KX at 15 visits    PT Start Time 1235    PT Stop Time 1308    PT Time Calculation (min) 33 min    Activity Tolerance Patient limited by fatigue    Behavior During Therapy [facility] for tasks assessed/performed           Past Medical History:  Diagnosis Date  . A-fib (HCC)   . Anemia   . Aortic valve stenosis   . Gall stones   . Low BP   . Mitral valve stenosis     Past Surgical History:  Procedure Laterality Date  . FRACTURE SURGERY  2016   Broken Arm  . HIP SURGERY  2009   Replacement repaired    . MELANOMA EXCISION  2013   right leg  . mirtral valve repair  2008  . ROTATOR CUFF REPAIR  2016  . TOTAL HIP ARTHROPLASTY  2008  . VAGINAL HYSTERECTOMY  2005    There were no vitals filed for this visit.   Subjective Assessment - 11/16/20 1240    Subjective I've been doing a little bit of exercise.    Currently in Pain? No/denies                             [facility] Adult PT Treatment/Exercise - 11/16/20 0001      Exercises   Exercises Knee/Hip;Ankle;Shoulder      Knee/Hip Exercises: Standing   Other Standing Knee Exercises weight shift at counter x 1 minute- fatigue      Knee/Hip Exercises: Seated   Long Arc Quad Both;2 sets;5 sets    [employer] x20    Clamshell with TheraBand Green   loop 2x10   Marching Strengthening;Both;2 sets;10 reps    Hamstring Curl Strengthening;Both;10 reps    Hamstring Limitations green loop      Shoulder Exercises: Seated   Retraction  Strengthening;10 reps    Flexion Strengthening;20 reps;Both                    PT Short Term Goals - 11/16/20 1243      PT SHORT TERM GOAL #1   Title Pt will be able to perform intial HEP with supervision for safety    Status On-going             PT Long Term Goals - 11/07/20 1255      PT LONG TERM GOAL #1   Title Pt will improve functional strength to allow for ind/supervision only with bed mobility and chair transfers    Time 8    Period Weeks    Status New    Target Date 01/02/21      PT LONG TERM GOAL #2   Title Pt will be able to demo coordination of step ups and downs with caregiver or PT min assist to simulate entry stairs access (4 stairs no railing) for improved safety.    Time 8    Period  Weeks    Status New    Target Date 01/02/21      PT LONG TERM GOAL #3   Title Pt will demo improved awareness of more erect posture with less need for cueing during ther ex for spine posture.    Time 8    Period Weeks    Status New    Target Date 01/02/21      PT LONG TERM GOAL #4   Title Pt will be able to ambulate at least 40' with RW and brief standing or sitting break as needed for SOB secondary to heart condition to demo improved gait endurance for household amb (with supervision)    Time 8    Period Weeks    Status New    Target Date 01/02/21                 Plan - 11/16/20 1313    Clinical Impression Statement First time follow-up after evaluation.  PT reviewed HEP with pt and worked on low level strength exercises per pt tolerance. Pt fatigued easily and required rest breaks between exercises.  PT provided verbal cueing for technique with exercise.  Shortened session due to pt fatigue.    PT Frequency 2x / week    PT Duration 8 weeks    PT Treatment/Interventions ADLs/Self Care Home Management;Moist Heat;Gait training;Stair training;Functional mobility training;Therapeutic activities;Therapeutic exercise;Neuromuscular re-education;Manual  techniques;Patient/family education;Passive range of motion;Balance training    PT Next Visit Plan short distance ambulation, standing, gentle strength as pt tolerates.    PT Home Exercise Plan Access Code: [email or web address]    Consulted and Agree with Plan of Care Patient;Family member/caregiver    Family Member Consulted Pt's daughter           Patient will benefit from skilled therapeutic intervention in order to improve the following deficits and impairments:  Abnormal gait,Decreased range of motion,Difficulty walking,Cardiopulmonary status limiting activity,Postural dysfunction,Decreased strength,Decreased mobility,Decreased balance,Improper body mechanics,Pain,Decreased activity tolerance  Visit Diagnosis: Muscle weakness (generalized)  Other abnormalities of gait and mobility     Problem List Patient Active Problem List   Diagnosis Date Noted  . Unstable gait 10/25/2020  . Paroxysmal atrial fibrillation (HCC) 10/25/2020  . Nonrheumatic mitral valve stenosis 10/25/2020  . Nonrheumatic aortic valve stenosis 10/25/2020  . Fatigue 10/25/2020  . Generalized abdominal pain 10/25/2020  . Iron deficiency anemia secondary to inadequate dietary iron intake 10/25/2020  . Hypotension 10/25/2020  . Chronic combined systolic and diastolic heart failure (HCC) 10/25/2020  . Chronic bilateral low back pain without sciatica 10/25/2020  . Multiple falls 10/25/2020  . Slow transit constipation 10/25/2020  . Poor appetite 10/25/2020  . Underweight due to inadequate caloric intake 10/25/2020    Lorrene Reid, PT 11/16/20 1:14 PM  [facility] [address] [place], [place], [address] Phone: [phone]   Fax:  [phone]  Name: Jaclyn Berry MRN: [id number] Date of Birth: 1926-12-28

## 2020-11-21 ENCOUNTER — Other Ambulatory Visit: Payer: Self-pay

## 2020-11-21 ENCOUNTER — Ambulatory Visit: Payer: Medicare Other

## 2020-11-21 DIAGNOSIS — R2689 Other abnormalities of gait and mobility: Secondary | ICD-10-CM

## 2020-11-21 DIAGNOSIS — R293 Abnormal posture: Secondary | ICD-10-CM

## 2020-11-21 DIAGNOSIS — M6281 Muscle weakness (generalized): Secondary | ICD-10-CM | POA: Diagnosis not present

## 2020-11-21 NOTE — Therapy (Signed)
[facility] [address], [address] [place], [place], [address] Phone: [phone]   Fax:  [phone]  Physical Therapy Treatment  Patient Details  Name: Jaclyn Berry MRN: [id number] Date of Birth: June 16, 1927 Referring Provider (PT): [name], NP   Encounter Date: 11/21/2020   PT End of Session - 11/21/20 1311    Visit Number 3    Date for PT Re-Evaluation 01/02/21    Authorization Type Medicare Part A and B, KX at 15 visits    Progress Note Due on Visit 10    PT Start Time 1233    PT Stop Time 1303    PT Time Calculation (min) 30 min    Activity Tolerance Patient limited by fatigue    Behavior During Therapy [facility] for tasks assessed/performed           Past Medical History:  Diagnosis Date  . A-fib (HCC)   . Anemia   . Aortic valve stenosis   . Gall stones   . Low BP   . Mitral valve stenosis     Past Surgical History:  Procedure Laterality Date  . FRACTURE SURGERY  2016   Broken Arm  . HIP SURGERY  2009   Replacement repaired    . MELANOMA EXCISION  2013   right leg  . mirtral valve repair  2008  . ROTATOR CUFF REPAIR  2016  . TOTAL HIP ARTHROPLASTY  2008  . VAGINAL HYSTERECTOMY  2005    There were no vitals filed for this visit.   Subjective Assessment - 11/21/20 1235    Subjective I was really tired after last session.  Difficulty getting up steps.    Currently in Pain? No/denies                             [facility] Adult PT Treatment/Exercise - 11/21/20 0001      Knee/Hip Exercises: Standing   Other Standing Knee Exercises 2 reps at countertop- 30 seconds to 1 minute      Knee/Hip Exercises: Seated   Ball Squeeze x20    Clamshell with TheraBand Green   loop 2x10   Hamstring Curl Strengthening;Both;10 reps    Hamstring Limitations green loop      Shoulder Exercises: Seated   Row Strengthening;20 reps;Theraband    Theraband Level (Shoulder Row) Level 1 (Yellow)     Flexion Strengthening;20 reps;Both                    PT Short Term Goals - 11/16/20 1243      PT SHORT TERM GOAL #1   Title Pt will be able to perform intial HEP with supervision for safety    Status On-going             PT Long Term Goals - 11/07/20 1255      PT LONG TERM GOAL #1   Title Pt will improve functional strength to allow for ind/supervision only with bed mobility and chair transfers    Time 8    Period Weeks    Status New    Target Date 01/02/21      PT LONG TERM GOAL #2   Title Pt will be able to demo coordination of step ups and downs with caregiver or PT min assist to simulate entry stairs access (4 stairs no railing) for improved safety.    Time 8    Period Weeks  Status New    Target Date 01/02/21      PT LONG TERM GOAL #3   Title Pt will demo improved awareness of more erect posture with less need for cueing during ther ex for spine posture.    Time 8    Period Weeks    Status New    Target Date 01/02/21      PT LONG TERM GOAL #4   Title Pt will be able to ambulate at least 40' with RW and brief standing or sitting break as needed for SOB secondary to heart condition to demo improved gait endurance for household amb (with supervision)    Time 8    Period Weeks    Status New    Target Date 01/02/21                 Plan - 11/21/20 1310    Clinical Impression Statement Pt was fatigued after last session and was challenged with getting into the house due to fatigue.  Pt session alternated between upper and lower body exercise to allow for rest for each muscle group.  Pt is not able to tolerate more than 30 minutes of exercise.  PT encouraged pt to do short bouts of exercise throughout the day as tolerated.  Pt requires close supervision with static standing and min assistance for sit to stand transition.  Pt will continue to benefit from skilled PT to address strength, endurance and functional mobility.    Examination-Activity  Limitations Bathing;Transfers;Locomotion Level;Bed Mobility;Bend;Sit;Stairs;Stand;Toileting;Hygiene/Grooming;Dressing    PT Frequency 2x / week    PT Duration 8 weeks    PT Treatment/Interventions ADLs/Self Care Home Management;Moist Heat;Gait training;Stair training;Functional mobility training;Therapeutic activities;Therapeutic exercise;Neuromuscular re-education;Manual techniques;Patient/family education;Passive range of motion;Balance training    PT Next Visit Plan short distance ambulation, standing, gentle strength as pt tolerates.    PT Home Exercise Plan Access Code: [email or web address]    Consulted and Agree with Plan of Care Patient;Family member/caregiver    Family Member Consulted Pt's daughter           Patient will benefit from skilled therapeutic intervention in order to improve the following deficits and impairments:  Abnormal gait,Decreased range of motion,Difficulty walking,Cardiopulmonary status limiting activity,Postural dysfunction,Decreased strength,Decreased mobility,Decreased balance,Improper body mechanics,Pain,Decreased activity tolerance  Visit Diagnosis: Muscle weakness (generalized)  Other abnormalities of gait and mobility  Abnormal posture     Problem List Patient Active Problem List   Diagnosis Date Noted  . Unstable gait 10/25/2020  . Paroxysmal atrial fibrillation (HCC) 10/25/2020  . Nonrheumatic mitral valve stenosis 10/25/2020  . Nonrheumatic aortic valve stenosis 10/25/2020  . Fatigue 10/25/2020  . Generalized abdominal pain 10/25/2020  . Iron deficiency anemia secondary to inadequate dietary iron intake 10/25/2020  . Hypotension 10/25/2020  . Chronic combined systolic and diastolic heart failure (HCC) 10/25/2020  . Chronic bilateral low back pain without sciatica 10/25/2020  . Multiple falls 10/25/2020  . Slow transit constipation 10/25/2020  . Poor appetite 10/25/2020  . Underweight due to inadequate caloric intake 10/25/2020     Lorrene Reid,  PT 11/21/20 1:12 PM  [facility] [address] [place], [place], [address] Phone: [phone]   Fax:  [phone]  Name: Cinnamon Morency MRN: [id number] Date of Birth: 1927/07/21

## 2020-11-22 ENCOUNTER — Telehealth: Payer: Self-pay

## 2020-11-22 NOTE — Telephone Encounter (Signed)
I am not in the office this week. Can you please fax to [facility] for me to look at.

## 2020-11-22 NOTE — Telephone Encounter (Signed)
Fax resent to requested number. Please let me know if you received it.

## 2020-11-22 NOTE — Telephone Encounter (Signed)
Patient papers faxed to two [facility] number [phone] &  [phone]. Please let me know if you received them.

## 2020-11-22 NOTE — Telephone Encounter (Signed)
Patient daughter "Charo Philipp" states that she had paper work filled out on 10/27/2020. She states that she needs last sheet to be completed again because it only allows for her to assist mother for a couple of hours. She states that she lives further away in [place] and has to come help assist here in [place] which means she'll need weeks at a time off. Patient states that she's fine paying the $25 again for paper to be corrected. Patient states she will fax papers back to office. Message routed to PCP [name], NP .

## 2020-11-23 ENCOUNTER — Other Ambulatory Visit: Payer: Self-pay

## 2020-11-23 ENCOUNTER — Telehealth: Payer: Self-pay

## 2020-11-23 ENCOUNTER — Ambulatory Visit: Payer: Medicare Other

## 2020-11-23 DIAGNOSIS — M6281 Muscle weakness (generalized): Secondary | ICD-10-CM

## 2020-11-23 DIAGNOSIS — R2689 Other abnormalities of gait and mobility: Secondary | ICD-10-CM

## 2020-11-23 DIAGNOSIS — R293 Abnormal posture: Secondary | ICD-10-CM

## 2020-11-23 NOTE — Telephone Encounter (Signed)
Incoming call received from patients daughter Judeth Cornfield requesting to pursue Jaclyn Berry's recommendations at last visit for a GI referral and swallowing test. Patient not able to eat and with falling concerns. Stephanie questions hiatal hernia.  It was recommended at last visit to consider medpass in the future and Judeth Cornfield asked if that will increase her calorie intake.  Please advise

## 2020-11-23 NOTE — Telephone Encounter (Signed)
Tera Partridge front desk administrations faxed papers to patient desired location as noted in last telephone encounter.

## 2020-11-23 NOTE — Telephone Encounter (Signed)
Paperwork complete. I have re-faxed to office

## 2020-11-23 NOTE — Therapy (Signed)
[facility] [address], [address] [place], [place], [address] Phone: [phone]   Fax:  [phone]  Physical Therapy Treatment  Patient Details  Name: Jaclyn Berry MRN: [id number] Date of Birth: 03/24/1927 Referring Provider (PT): [name], NP   Encounter Date: 11/23/2020   PT End of Session - 11/23/20 1311    Visit Number 4    Date for PT Re-Evaluation 01/02/21    Authorization Type Medicare Part A and B, KX at 15 visits    Progress Note Due on Visit 10    PT Start Time 1232    PT Stop Time 1304    PT Time Calculation (min) 32 min    Activity Tolerance Patient limited by fatigue    Behavior During Therapy [facility] for tasks assessed/performed           Past Medical History:  Diagnosis Date  . A-fib (HCC)   . Anemia   . Aortic valve stenosis   . Gall stones   . Low BP   . Mitral valve stenosis     Past Surgical History:  Procedure Laterality Date  . FRACTURE SURGERY  2016   Broken Arm  . HIP SURGERY  2009   Replacement repaired    . MELANOMA EXCISION  2013   right leg  . mirtral valve repair  2008  . ROTATOR CUFF REPAIR  2016  . TOTAL HIP ARTHROPLASTY  2008  . VAGINAL HYSTERECTOMY  2005    There were no vitals filed for this visit.   Subjective Assessment - 11/23/20 1234    Subjective I was tired last time but it felt like the right amount of exercise.    Patient Stated Goals be more independent with bed mobility, work on gait with RW, transfers, strength    Currently in Pain? No/denies                             [facility] Adult PT Treatment/Exercise - 11/23/20 0001      Transfers   Comments sit to stand x 2 with assist by PT      Knee/Hip Exercises: Standing   Other Standing Knee Exercises 2 reps at countertop- 90 seconds each- improved      Knee/Hip Exercises: Seated   Ball Squeeze x20    Clamshell with TheraBand Green   loop 2x10   Hamstring Curl Strengthening;Both;10  reps    Hamstring Limitations green loop      Shoulder Exercises: Seated   Row Strengthening;20 reps;Theraband    Theraband Level (Shoulder Row) Level 1 (Yellow)    Flexion Strengthening;20 reps;Both    Other Seated Exercises biceps curls: 2x10 2#                    PT Short Term Goals - 11/23/20 1248      PT SHORT TERM GOAL #1   Title Pt will be able to perform intial HEP with supervision for safety    Status Achieved      PT SHORT TERM GOAL #2   Title Pt will be able to demo bed rolling Lt/Rt with supervision using proper form and muscle groups    Status On-going      PT SHORT TERM GOAL #3   Title Pt will report at least 30% greater ease and confidence with bed mobility and transfers with supervision/min assist as needed    Status On-going  PT Long Term Goals - 11/07/20 1255      PT LONG TERM GOAL #1   Title Pt will improve functional strength to allow for ind/supervision only with bed mobility and chair transfers    Time 8    Period Weeks    Status New    Target Date 01/02/21      PT LONG TERM GOAL #2   Title Pt will be able to demo coordination of step ups and downs with caregiver or PT min assist to simulate entry stairs access (4 stairs no railing) for improved safety.    Time 8    Period Weeks    Status New    Target Date 01/02/21      PT LONG TERM GOAL #3   Title Pt will demo improved awareness of more erect posture with less need for cueing during ther ex for spine posture.    Time 8    Period Weeks    Status New    Target Date 01/02/21      PT LONG TERM GOAL #4   Title Pt will be able to ambulate at least 40' with RW and brief standing or sitting break as needed for SOB secondary to heart condition to demo improved gait endurance for household amb (with supervision)    Time 8    Period Weeks    Status New    Target Date 01/02/21                 Plan - 11/23/20 1315    Clinical Impression Statement Pt and her daughter  report that the exercise was appropriate last session.  Pt session alternated between upper and lower body exercise to allow for rest for each muscle group.  Pt is not able to tolerate more than 30 minutes of exercise. Pt was able to stand for 2 bouts of 90 seconds today. Pt requires close supervision with static standing and min assistance for sit to stand transition.  Pt was able to perform sit to stand x 2 with CGA to min A.  Pt will continue to benefit from skilled PT to address strength, endurance and functional mobility.    PT Treatment/Interventions ADLs/Self Care Home Management;Moist Heat;Gait training;Stair training;Functional mobility training;Therapeutic activities;Therapeutic exercise;Neuromuscular re-education;Manual techniques;Patient/family education;Passive range of motion;Balance training    PT Next Visit Plan short distance ambulation, standing, gentle strength as pt tolerates.    PT Home Exercise Plan Access Code: [email or web address]    Consulted and Agree with Plan of Care Patient;Family member/caregiver           Patient will benefit from skilled therapeutic intervention in order to improve the following deficits and impairments:  Abnormal gait,Decreased range of motion,Difficulty walking,Cardiopulmonary status limiting activity,Postural dysfunction,Decreased strength,Decreased mobility,Decreased balance,Improper body mechanics,Pain,Decreased activity tolerance  Visit Diagnosis: Muscle weakness (generalized)  Other abnormalities of gait and mobility  Abnormal posture     Problem List Patient Active Problem List   Diagnosis Date Noted  . Unstable gait 10/25/2020  . Paroxysmal atrial fibrillation (HCC) 10/25/2020  . Nonrheumatic mitral valve stenosis 10/25/2020  . Nonrheumatic aortic valve stenosis 10/25/2020  . Fatigue 10/25/2020  . Generalized abdominal pain 10/25/2020  . Iron deficiency anemia secondary to inadequate dietary iron intake 10/25/2020  . Hypotension 10/25/2020   . Chronic combined systolic and diastolic heart failure (HCC) 10/25/2020  . Chronic bilateral low back pain without sciatica 10/25/2020  . Multiple falls 10/25/2020  . Slow transit constipation 10/25/2020  . Poor appetite 10/25/2020  .  Underweight due to inadequate caloric intake 10/25/2020     Lorrene Reid, PT 11/23/20 1:16 PM  [facility] [address] [place], [place], [address] Phone: [phone]   Fax:  [phone]  Name: Jaclyn Berry MRN: [id number] Date of Birth: Jul 28, 1927

## 2020-11-24 ENCOUNTER — Other Ambulatory Visit: Payer: Self-pay | Admitting: Orthopedic Surgery

## 2020-11-24 DIAGNOSIS — K449 Diaphragmatic hernia without obstruction or gangrene: Secondary | ICD-10-CM

## 2020-11-24 DIAGNOSIS — R1310 Dysphagia, unspecified: Secondary | ICD-10-CM

## 2020-11-24 DIAGNOSIS — K8020 Calculus of gallbladder without cholecystitis without obstruction: Secondary | ICD-10-CM

## 2020-11-24 DIAGNOSIS — R12 Heartburn: Secondary | ICD-10-CM

## 2020-11-24 NOTE — Telephone Encounter (Signed)
Below is Amy's reply sent via routing comment versus in documentation:  Referral to GI made. Referral for barium swallow study to evaluate swallowing made. She may purchase Medpass or High calorie BOOST/Ensure over the counter.   Spoke with Judeth Cornfield and relayed Amy's response. Judeth Cornfield verbalized understanding

## 2020-11-25 ENCOUNTER — Telehealth: Payer: Self-pay

## 2020-11-25 NOTE — Telephone Encounter (Signed)
Notes on file.  

## 2020-11-29 ENCOUNTER — Encounter: Payer: Self-pay | Admitting: Physician Assistant

## 2020-11-30 ENCOUNTER — Ambulatory Visit: Payer: Medicare Other | Admitting: Physical Therapy

## 2020-11-30 ENCOUNTER — Other Ambulatory Visit: Payer: Self-pay

## 2020-11-30 ENCOUNTER — Encounter: Payer: Self-pay | Admitting: Physical Therapy

## 2020-11-30 ENCOUNTER — Telehealth: Payer: Self-pay | Admitting: Internal Medicine

## 2020-11-30 DIAGNOSIS — R293 Abnormal posture: Secondary | ICD-10-CM

## 2020-11-30 DIAGNOSIS — M6281 Muscle weakness (generalized): Secondary | ICD-10-CM | POA: Diagnosis not present

## 2020-11-30 DIAGNOSIS — R2689 Other abnormalities of gait and mobility: Secondary | ICD-10-CM

## 2020-11-30 NOTE — Telephone Encounter (Signed)
Echo from 10/20/18 OSH Echo  LVEF 20-25% Severe MR Mild AI Severe AS Severe TR Severe LAE Mod to Severe RA Dilated IVC

## 2020-11-30 NOTE — Telephone Encounter (Signed)
Called Saldierna family and left voicemail:  "Received records from Dr. Carlyle Lipa and want to check in"  Per Dr. Alphia Moh does not want any invasive procedures for her mitral and aortic valve and would like to use medications.  Given this- we are happy to cancel the echocardiogram is family would prefer based on her GOC.  Let's reach out to patient and make sure that is what she would want.  [name], MD

## 2020-11-30 NOTE — Therapy (Signed)
[facility] [address] [place], [place], [address] Phone: [phone]   Fax:  [phone]  Physical Therapy Treatment  Patient Details  Name: Jaclyn Berry MRN: [id number] Date of Birth: 1927-02-16 Referring Provider (PT): [name], NP   Encounter Date: 11/30/2020   PT End of Session - 11/30/20 1728    Visit Number 5    Date for PT Re-Evaluation 01/02/21    Authorization Type Medicare Part A and B, KX at 15 visits    Progress Note Due on Visit 10    PT Start Time 1618    PT Stop Time 1657    PT Time Calculation (min) 39 min    Equipment Utilized During Treatment Gait belt    Activity Tolerance Patient limited by fatigue    Behavior During Therapy W[facility] for tasks assessed/performed           Past Medical History:  Diagnosis Date  . A-fib (HCC)   . Anemia   . Aortic valve stenosis   . Gall stones   . Low BP   . Mitral valve stenosis     Past Surgical History:  Procedure Laterality Date  . FRACTURE SURGERY  2016   Broken Arm  . HIP SURGERY  2009   Replacement repaired    . MELANOMA EXCISION  2013   right leg  . mirtral valve repair  2008  . ROTATOR CUFF REPAIR  2016  . TOTAL HIP ARTHROPLASTY  2008  . VAGINAL HYSTERECTOMY  2005    There were no vitals filed for this visit.   Subjective Assessment - 11/30/20 1715    Subjective Patient reports some increased fatigue possibly due to appointment being later in the day. Was exhaused after last session. Per daughter, family continues to have increased difficulty ascending stairs to get to home.    Patient is accompained by: Family member    Pertinent History PMH: h/o fall, Rt hip replacement with revision after a fall (2008/2009) with ongoing pain since femur fraction/revision, a-fib, reduced ejection fraction    Patient Stated Goals be more independent with bed mobility, work on gait with RW, transfers, strength    Currently in Pain? No/denies                              [facility] Adult PT Treatment/Exercise - 11/30/20 0001      Transfers   Comments x3 sit to stand with CGA from PT      Ambulation/Gait   Stairs Yes    Stairs Assistance 4: Min assist    Stair Management Technique Two rails;Step to pattern   attempting leading with Rt LE and then with Lt LE   Number of Stairs 3    Height of Stairs 6      Knee/Hip Exercises: Standing   Other Standing Knee Exercises 6 inch stair tap using hand hold x2; x6 reps    Other Standing Knee Exercises static standing x 1 reps at RW x 90s      Knee/Hip Exercises: Seated   Clamshell with TheraBand Green   2 x 10 reps green loop; x10 yellow tband loop                 PT Education - 11/30/20 1716    Education Details added seated clam yellow tband    Person(s) Educated Patient;Child(ren)    Methods Explanation;Demonstration;Verbal cues;Handout    Comprehension  Verbalized understanding;Returned demonstration;Verbal cues required            PT Short Term Goals - 11/30/20 1728      PT SHORT TERM GOAL #3   Title Pt will report at least 30% greater ease and confidence with bed mobility and transfers with supervision/min assist as needed    Time 4    Period Weeks    Status On-going      PT SHORT TERM GOAL #4   Title Pt will be able to ambulate at least 20' using RW with min/mod SOB and recovery within 1' of sitting    Time 4    Period Weeks    Status On-going    Target Date 12/05/20             PT Long Term Goals - 11/07/20 1255      PT LONG TERM GOAL #1   Title Pt will improve functional strength to allow for ind/supervision only with bed mobility and chair transfers    Time 8    Period Weeks    Status New    Target Date 01/02/21      PT LONG TERM GOAL #2   Title Pt will be able to demo coordination of step ups and downs with caregiver or PT min assist to simulate entry stairs access (4 stairs no railing) for improved safety.    Time 8     Period Weeks    Status New    Target Date 01/02/21      PT LONG TERM GOAL #3   Title Pt will demo improved awareness of more erect posture with less need for cueing during ther ex for spine posture.    Time 8    Period Weeks    Status New    Target Date 01/02/21      PT LONG TERM GOAL #4   Title Pt will be able to ambulate at least 40' with RW and brief standing or sitting break as needed for SOB secondary to heart condition to demo improved gait endurance for household amb (with supervision)    Time 8    Period Weeks    Status New    Target Date 01/02/21                 Plan - 11/30/20 1717    Clinical Impression Statement Patient completing x3 sit to stands during session. Performing x2 from wheelchair to RW with CGA. Patient then performing x1 from wheel chair to standing without AD with CGA. Requiring steadying assist upon immediately standing. Reporting maximal fatigue following 90s standing trial. Continues to have difficulty navigating stairs at home. Noting Rt knee buckling when attempting to ascend using Lt LE. Patient requiring mod A to ascend using Rt LE with therapist providing manual facilitation of Rt quad. Despite mod A, patient unable to ascend using Rt LE. Daughter requesting suggestion for comfortable sleeping positions. Therapist providing education on positioning for semi-sidelying rather than direct sidelying. Daughter verbalizing understanding. Would benefit from continued skilled intervention for improved functional and bed mobility with increased independence.    Personal Factors and Comorbidities Age;Time since onset of injury/illness/exacerbation;Comorbidity 1;Comorbidity 2    Comorbidities a-fib, Rt THR with femur fracture revision    Examination-Activity Limitations Bathing;Transfers;Locomotion Level;Bed Mobility;Bend;Sit;Stairs;Stand;Toileting;Hygiene/Grooming;Dressing    Examination-Participation Restrictions Community Activity;Other    Rehab Potential  Good    PT Frequency 2x / week    PT Duration 8 weeks    PT Treatment/Interventions ADLs/Self  Care Home Management;Moist Heat;Gait training;Stair training;Functional mobility training;Therapeutic activities;Therapeutic exercise;Neuromuscular re-education;Manual techniques;Patient/family education;Passive range of motion;Balance training    PT Next Visit Plan attempt short distance ambulation, continue standing and gentle LE strengthening    PT Home Exercise Plan Access Code: [email or web address]    Consulted and Agree with Plan of Care Patient;Family member/caregiver    Family Member Consulted Pt's daughter           Patient will benefit from skilled therapeutic intervention in order to improve the following deficits and impairments:  Abnormal gait,Decreased range of motion,Difficulty walking,Cardiopulmonary status limiting activity,Postural dysfunction,Decreased strength,Decreased mobility,Decreased balance,Improper body mechanics,Pain,Decreased activity tolerance  Visit Diagnosis: Muscle weakness (generalized)  Other abnormalities of gait and mobility  Abnormal posture     Problem List Patient Active Problem List   Diagnosis Date Noted  . Unstable gait 10/25/2020  . Paroxysmal atrial fibrillation (HCC) 10/25/2020  . Nonrheumatic mitral valve stenosis 10/25/2020  . Nonrheumatic aortic valve stenosis 10/25/2020  . Fatigue 10/25/2020  . Generalized abdominal pain 10/25/2020  . Iron deficiency anemia secondary to inadequate dietary iron intake 10/25/2020  . Hypotension 10/25/2020  . Chronic combined systolic and diastolic heart failure (HCC) 10/25/2020  . Chronic bilateral low back pain without sciatica 10/25/2020  . Multiple falls 10/25/2020  . Slow transit constipation 10/25/2020  . Poor appetite 10/25/2020  . Underweight due to inadequate caloric intake 10/25/2020   Anabel Halon PT, DPT  11/30/20 5:30 PM    [facility] [address], [address] [place], [place], [address] Phone: [phone]   Fax:  [phone]  Name: Jaclyn Berry MRN: [id number] Date of Birth: [birth year]

## 2020-11-30 NOTE — Telephone Encounter (Signed)
Left a message for daughter to call office in regards to 12/05/20 ECHO appointment. (Ok per [employer])

## 2020-11-30 NOTE — Patient Instructions (Addendum)
Seated Hip Abduction with Resistance - 1 x daily - 7 x weekly - 2 sets - 10 reps

## 2020-12-02 ENCOUNTER — Other Ambulatory Visit: Payer: Self-pay

## 2020-12-02 ENCOUNTER — Other Ambulatory Visit: Payer: Self-pay | Admitting: Orthopedic Surgery

## 2020-12-02 ENCOUNTER — Ambulatory Visit: Payer: Medicare Other | Admitting: Orthopedic Surgery

## 2020-12-02 ENCOUNTER — Ambulatory Visit (HOSPITAL_COMMUNITY)
Admission: RE | Admit: 2020-12-02 | Discharge: 2020-12-02 | Disposition: A | Discharge status: Home / Self Care | Payer: Medicare Other | Source: Ambulatory Visit | Attending: Orthopedic Surgery | Admitting: Orthopedic Surgery

## 2020-12-02 DIAGNOSIS — R1310 Dysphagia, unspecified: Secondary | ICD-10-CM | POA: Diagnosis present

## 2020-12-02 DIAGNOSIS — K224 Dyskinesia of esophagus: Secondary | ICD-10-CM

## 2020-12-02 MED ORDER — METOCLOPRAMIDE HCL 5 MG PO TABS: 5.0000 mg | ORAL_TABLET | Freq: Two times a day (BID) | ORAL | 1 refills | Status: DC

## 2020-12-02 NOTE — Progress Notes (Signed)
Barium swallow results discussed with patient and daughter. Prescription for Reglan sent in. GI consult scheduled for 05/03.

## 2020-12-05 ENCOUNTER — Ambulatory Visit (HOSPITAL_COMMUNITY): Payer: Medicare Other | Attending: Cardiology

## 2020-12-05 ENCOUNTER — Other Ambulatory Visit: Payer: Self-pay

## 2020-12-05 DIAGNOSIS — I509 Heart failure, unspecified: Secondary | ICD-10-CM | POA: Diagnosis present

## 2020-12-05 LAB — ECHOCARDIOGRAM COMPLETE
AR max vel: 0.68 cm2
AV Area VTI: 0.6 cm2
AV Area mean vel: 0.58 cm2
AV Mean grad: 51 mmHg
AV Peak grad: 89.4 mmHg
Ao pk vel: 4.73 m/s
Area-P 1/2: 3.31 cm2
P 1/2 time: 562 msec
S' Lateral: 4 cm

## 2020-12-07 ENCOUNTER — Ambulatory Visit: Payer: Medicare Other

## 2020-12-07 ENCOUNTER — Other Ambulatory Visit: Payer: Self-pay

## 2020-12-07 DIAGNOSIS — M6281 Muscle weakness (generalized): Secondary | ICD-10-CM

## 2020-12-07 DIAGNOSIS — R293 Abnormal posture: Secondary | ICD-10-CM

## 2020-12-07 DIAGNOSIS — R2689 Other abnormalities of gait and mobility: Secondary | ICD-10-CM

## 2020-12-07 NOTE — Therapy (Signed)
[facility] [address], [address] [place], [place], [address] Phone: [phone]   Fax:  [phone]  Physical Therapy Treatment  Patient Details  Name: Jaclyn Berry MRN: [id number] Date of Birth: Aug 10, 1927 Referring Provider (PT): [name], NP   Encounter Date: 12/07/2020   PT End of Session - 12/07/20 1308    Visit Number 6    Date for PT Re-Evaluation 01/02/21    Authorization Type Medicare Part A and B, KX at 15 visits    Progress Note Due on Visit 10    PT Start Time 1235    PT Stop Time 1308    PT Time Calculation (min) 33 min    Activity Tolerance Patient limited by fatigue    Behavior During Therapy [facility] for tasks assessed/performed           Past Medical History:  Diagnosis Date  . A-fib (HCC)   . Anemia   . Aortic valve stenosis   . Gall stones   . Low BP   . Mitral valve stenosis     Past Surgical History:  Procedure Laterality Date  . FRACTURE SURGERY  2016   Broken Arm  . HIP SURGERY  2009   Replacement repaired    . MELANOMA EXCISION  2013   right leg  . mirtral valve repair  2008  . ROTATOR CUFF REPAIR  2016  . TOTAL HIP ARTHROPLASTY  2008  . VAGINAL HYSTERECTOMY  2005    There were no vitals filed for this visit.   Subjective Assessment - 12/07/20 1242    Subjective I am doing exercises with my legs.  Pt's daughter reports that pt was able to lift her leg up to put on her shoes independently today.    Patient Stated Goals be more independent with bed mobility, work on gait with RW, transfers, strength    Currently in Pain? No/denies                             [facility] Adult PT Treatment/Exercise - 12/07/20 0001      Knee/Hip Exercises: Aerobic   Nustep Level 1x 8 minutes (rest breaks taken to perform UE exercise      Knee/Hip Exercises: Seated   Ball Squeeze x20    Clamshell with TheraBand Green   2 x 10 reps green loop; x10 yellow tband loop   Hamstring  Curl Strengthening;Both;10 reps    Hamstring Limitations green loop      Shoulder Exercises: Seated   Row Strengthening;20 reps;Theraband    Theraband Level (Shoulder Row) Level 1 (Yellow)    Flexion Strengthening;20 reps;Both    Abduction Strengthening;15 reps    Other Seated Exercises biceps curls: 2x10 1#                    PT Short Term Goals - 12/07/20 1244      PT SHORT TERM GOAL #1   Title Pt will be able to perform intial HEP with supervision for safety    Status Achieved      PT SHORT TERM GOAL #2   Title Pt will be able to demo bed rolling Lt/Rt with supervision using proper form and muscle groups    Baseline min assist    Status On-going      PT SHORT TERM GOAL #4   Title Pt will be able to ambulate at least 20' using RW with  min/mod SOB and recovery within 1' of sitting    Status On-going             PT Long Term Goals - 11/07/20 1255      PT LONG TERM GOAL #1   Title Pt will improve functional strength to allow for ind/supervision only with bed mobility and chair transfers    Time 8    Period Weeks    Status New    Target Date 01/02/21      PT LONG TERM GOAL #2   Title Pt will be able to demo coordination of step ups and downs with caregiver or PT min assist to simulate entry stairs access (4 stairs no railing) for improved safety.    Time 8    Period Weeks    Status New    Target Date 01/02/21      PT LONG TERM GOAL #3   Title Pt will demo improved awareness of more erect posture with less need for cueing during ther ex for spine posture.    Time 8    Period Weeks    Status New    Target Date 01/02/21      PT LONG TERM GOAL #4   Title Pt will be able to ambulate at least 40' with RW and brief standing or sitting break as needed for SOB secondary to heart condition to demo improved gait endurance for household amb (with supervision)    Time 8    Period Weeks    Status New    Target Date 01/02/21                 Plan -  12/07/20 1252    Clinical Impression Statement Pt continues to work on endurance, strength and mobility.  Pt saw a MD and is now taking medication that is allowing her to eat more and increase her caloric intake for weight gain.  Pt's daughter reports that she was able to independently lift her leg onto a coffee table to put on shoes today.  Pt tolerates 8  minutes on the NuStep arms/legs with intermittent breaks taken to perform UE exercise to rest legs during this bout.  Pt transferred independently from wheelchair to NuStep today.  Pt is making slow progress due to period of immobility and muscle mass loss recently.  Pt will continue to benefit from skilled PT to address strength, endurance and mobility.    PT Frequency 2x / week    PT Duration 8 weeks    PT Treatment/Interventions ADLs/Self Care Home Management;Moist Heat;Gait training;Stair training;Functional mobility training;Therapeutic activities;Therapeutic exercise;Neuromuscular re-education;Manual techniques;Patient/family education;Passive range of motion;Balance training    PT Next Visit Plan Continue NuStep, attempt short distance ambulation, continue standing and gentle LE strengthening    PT Home Exercise Plan Access Code: [email or web address]    Consulted and Agree with Plan of Care Patient;Family member/caregiver    Family Member Consulted Pt's daughter           Patient will benefit from skilled therapeutic intervention in order to improve the following deficits and impairments:  Abnormal gait,Decreased range of motion,Difficulty walking,Cardiopulmonary status limiting activity,Postural dysfunction,Decreased strength,Decreased mobility,Decreased balance,Improper body mechanics,Pain,Decreased activity tolerance  Visit Diagnosis: Muscle weakness (generalized)  Other abnormalities of gait and mobility  Abnormal posture     Problem List Patient Active Problem List   Diagnosis Date Noted  . Unstable gait 10/25/2020  . Paroxysmal  atrial fibrillation (HCC) 10/25/2020  . Nonrheumatic mitral valve stenosis 10/25/2020  .  Nonrheumatic aortic valve stenosis 10/25/2020  . Fatigue 10/25/2020  . Generalized abdominal pain 10/25/2020  . Iron deficiency anemia secondary to inadequate dietary iron intake 10/25/2020  . Hypotension 10/25/2020  . Chronic combined systolic and diastolic heart failure (HCC) 10/25/2020  . Chronic bilateral low back pain without sciatica 10/25/2020  . Multiple falls 10/25/2020  . Slow transit constipation 10/25/2020  . Poor appetite 10/25/2020  . Underweight due to inadequate caloric intake 10/25/2020    Lorrene Reid, PT 12/07/20 1:11 PM  [facility] [address] [place], [place], [address] Phone: [phone]   Fax:  [phone]  Name: Jaclyn Berry MRN: [id number] Date of Birth: [birth year]

## 2020-12-08 ENCOUNTER — Ambulatory Visit (INDEPENDENT_AMBULATORY_CARE_PROVIDER_SITE_OTHER): Payer: Medicare Other | Admitting: Orthopedic Surgery

## 2020-12-08 ENCOUNTER — Encounter: Payer: Self-pay | Admitting: Orthopedic Surgery

## 2020-12-08 VITALS — BP 98/60 | HR 71 | Temp 97.9°F | Resp 20 | Ht 62.0 in | Wt 76.6 lb

## 2020-12-08 DIAGNOSIS — K5901 Slow transit constipation: Secondary | ICD-10-CM

## 2020-12-08 DIAGNOSIS — R636 Underweight: Secondary | ICD-10-CM

## 2020-12-08 DIAGNOSIS — R1310 Dysphagia, unspecified: Secondary | ICD-10-CM | POA: Diagnosis not present

## 2020-12-08 DIAGNOSIS — K224 Dyskinesia of esophagus: Secondary | ICD-10-CM

## 2020-12-08 DIAGNOSIS — I480 Paroxysmal atrial fibrillation: Secondary | ICD-10-CM

## 2020-12-08 DIAGNOSIS — M545 Low back pain, unspecified: Secondary | ICD-10-CM

## 2020-12-08 DIAGNOSIS — R54 Age-related physical debility: Secondary | ICD-10-CM

## 2020-12-08 DIAGNOSIS — K8020 Calculus of gallbladder without cholecystitis without obstruction: Secondary | ICD-10-CM

## 2020-12-08 DIAGNOSIS — D692 Other nonthrombocytopenic purpura: Secondary | ICD-10-CM

## 2020-12-08 DIAGNOSIS — G8929 Other chronic pain: Secondary | ICD-10-CM

## 2020-12-08 MED ORDER — TRAMADOL HCL 50 MG PO TABS: 50.0000 mg | ORAL_TABLET | Freq: Two times a day (BID) | ORAL | 1 refills | Status: AC

## 2020-12-08 MED ORDER — DOCUSATE SODIUM 100 MG PO CAPS: 100.0000 mg | ORAL_CAPSULE | Freq: Two times a day (BID) | ORAL | 3 refills | Status: DC

## 2020-12-08 NOTE — Patient Instructions (Signed)
Continue follow up with GI  Dysphagia Eating Plan, Bite Size Food This eating plan is for people with moderate swallowing problems who have transitioned from pureed and minced foods. Bite size foods are soft and cut into small chunks so that they can be swallowed safely. On this eating plan, you may be instructed to drink liquids that are thickened. Work with your health care provider and your diet and nutrition specialist (dietitian) to make sure that you are following the eating plan safely and getting all the nutrients you need. What are tips for following this plan? General information  You may eat foods that are tender, soft, and moist.  Always test food texture before taking a bite. Poke food with a fork or spoon to make sure it is tender. The test sample should squash, break apart, or change shape, and it should not return to its original shape when the fork or spoon is removed.  Food should be easy to cut and chew. Avoid large pieces of food that require a lot of chewing.  Take small bites. Each bite should be smaller than your thumb nail (about 15 mm by 15 mm).  If you were on a pureed or minced food eating plan, you may eat any of the foods included in those diets.  Avoid foods that are very dry, hard, sticky, chewy, coarse, or crunchy.  If instructed by your health care provider, thicken liquids. Follow your health care provider's instructions for what products to use, how to do this, and to what thickness. ? Your health care provider may recommend using a commercial thickener, rice cereal, or potato flakes. Ask your health care provider to recommend thickeners. ? Thickened liquids are usually a "pudding-like" consistency, or they may be as thick as honey or thick enough to eat with a spoon.   Cooking  To moisten foods, you may add liquids while you are blending, mashing, or grinding your foods to the right consistency. These liquids include gravies, sauces, vegetable or fruit  juice, milk, half and half, or water.  Strain extra liquid from foods before eating.  Reheat foods slowly to prevent a tough crust from forming.  Prepare foods in advance. Meal planning  Eat a variety of foods to get all the nutrients you need.  Some foods may be tolerated better than others. Work with your dietitian to identify which foods are safest for you to eat.  Follow your meal plan as told by your dietitian. What foods can I eat? Grains Moist breads without nuts or seeds. Biscuits, muffins, pancakes, and waffles that are well-moistened with syrup, jelly, margarine, or butter. Cooked cereals. Moist bread stuffing. Moist rice. Well-moistened cold cereal with small chunks. Well-cooked pasta, noodles, rice, and bread dressing in small pieces and thick sauce. Soft dumplings or spaetzle in small pieces and butter or gravy. Fruits Canned or cooked fruits that are soft or moist and do not have skin or seeds. Fresh, soft bananas. Thickened fruit juices. Vegetables Soft, well-cooked vegetables in small pieces. Soft-cooked, mashed potatoes. Thickened vegetable juice. Meats and other proteins Tender, moist meats or poultry in small pieces. Moist meatballs or meatloaf. Fish without bones. Eggs or egg substitutes in small pieces. Tofu. [place] and meat alternatives in small pieces. Well-cooked, tender beans, peas, baked beans, and other legumes. Dairy Thickened milk. Cream cheese. Yogurt. Cottage cheese. Sour cream. Small pieces of soft cheese. Fats and oils Butter. Oils. Margarine. Mayonnaise. Gravy. Spreads. Sweets and desserts Soft, smooth, moist desserts. Pudding. Custard. Moist  cakes. Jam. Jelly. Honey. Preserves. Ask your health care provider whether you can have frozen desserts. Seasonings and other foods All seasonings and sweeteners. All sauces with small chunks. Prepared tuna, egg, or chicken salad without raw fruits or vegetables. Moist casseroles with small, tender pieces of meat.  Soups with tender meat. The items listed above may not be a complete list of foods and beverages you can eat. Contact a dietitian for more information. What foods must I avoid? Grains Coarse or dry cereals. Dry breads. Toast. Crackers. Tough, crusty breads, such as [place] bread and baguettes. Dry pancakes, waffles, and muffins. Sticky rice. Dry bread stuffing. Granola. Popcorn. Chips. Fruits Hard, crunchy, stringy, high-pulp, and juicy raw fruits such as apples, pineapple, papaya, and watermelon. Small, round fruits, such as grapes. Dried fruit and fruit leather. Vegetables All raw vegetables. Cooked corn. Rubbery or stiff cooked vegetables. Stringy vegetables, such as celery. Tough, crisp fried potatoes. Potato skins. Meats and other proteins Large pieces of meat. Dry, tough meats, such as bacon, sausage, and hot dogs. Chicken, [place], or fish with skin and bones. Crunchy peanut butter. Nuts. Seeds. Nut and seed butters. Dairy Yogurt with nuts, seeds, or large chunks. Large chunks of cheese. Sweets and desserts Dry cakes. Chewy or dry cookies. Any desserts with nuts, seeds, dry fruits, coconut, pineapple, or anything dry, sticky, or hard. Chewy caramel. Licorice. Taffy-type candies. Ask your health care provider whether you can have frozen desserts. Seasonings and other foods Soups with tough or large chunks of meats, poultry, or vegetables. Corn or clam chowder. Smoothies with large chunks of fruit. The items listed above may not be a complete list of foods and beverages you should avoid. Contact a dietitian for more information. Summary  Bite size foods can be helpful for people with moderate swallowing problems.  On this dysphagia eating plan, you may eat foods that are soft, moist, and cut into pieces smaller than your thumb nail (about 15 mm by 15 mm).  You may be instructed to thicken liquids. Follow your health care provider's instructions about how to do this and to what  consistency. This information is not intended to replace advice given to you by your health care provider. Make sure you discuss any questions you have with your health care provider. Document Revised: 04/18/2020 Document Reviewed: 04/18/2020 Elsevier Patient Education  2021 [employer].

## 2020-12-08 NOTE — Progress Notes (Signed)
Careteam: Patient Care Team: [name], NP as PCP - General (Adult Health Nurse Practitioner) [name], MD as PCP - Cardiology (Cardiology)  Seen by: Hazle Nordmann, AGNP-C  PLACE OF SERVICE:  [facility]  Advanced Directive information Does Patient Have a Medical Advance Directive?: Yes, Type of Advance Directive: Healthcare Power of Attorney, Does patient want to make changes to medical advance directive?: No - Patient declined  Allergies  Allergen Reactions  . Morphine And Related     Vomit   . Sulfa Antibiotics     Rash     Chief Complaint  Patient presents with  . Medical Management of Chronic Issues    6 Weeks Follow Up  . Quality Metric Gaps    Due Dexa Scan  . Immunizations    Due T-Dap, PCV-13     HPI: Patient is a 87 y.o. female seen today for medical management of chronic conditions.   Barium swallow study completed 04/22. Esophageal motility disorder suggested. Reglan 5 mg twice daily prescribed. Medication started 04/23. Daughter claims she is not gagging on big pills or foods as much. Still plans to see GI next week.   Gained one pound since last visit. Drinking 2 Boosts a day. Trying to promote small meals throughout the day.   She is doing PT, two times a week. Did elliptical for 8 minutes yesterday. Tired after sessions, but describes it as a "good tired." Denies joint pain after sessions. Daughter notices she is transferring better and overall mobility improved. No recent falls or injuries.   Denies abdominal pain. Still having constipation at times. Reports large bowel movement within past week. Wanting to try colace instead of senokot.   Lower back pain ongoing. Having trouble sleeping due to pain.  Wanting to try salonopas back patches. Takes naps during day.   Recently went to eye doctor. Given eye glasses to improve macular degeneration.   Review of Systems:  Review of Systems  Constitutional: Positive for malaise/fatigue and  weight loss. Negative for chills and fever.  HENT: Negative for hearing loss.        Trouble swallowing  Eyes: Negative for blurred vision and double vision.       Glasses  Respiratory: Positive for shortness of breath. Negative for cough and wheezing.        With exertion  Cardiovascular: Negative for chest pain and leg swelling.  Gastrointestinal: Positive for constipation. Negative for abdominal pain, blood in stool, heartburn and nausea.  Genitourinary: Positive for frequency. Negative for dysuria.  Musculoskeletal: Positive for joint pain and myalgias. Negative for falls.  Skin:       Fragile skin  Neurological: Positive for weakness. Negative for dizziness and headaches.  Psychiatric/Behavioral: Positive for depression. The patient has insomnia. The patient is not nervous/anxious.     Past Medical History:  Diagnosis Date  . A-fib (HCC)   . Anemia   . Aortic valve stenosis   . Gall stones   . Low BP   . Mitral valve stenosis    Past Surgical History:  Procedure Laterality Date  . FRACTURE SURGERY  2016   Broken Arm  . HIP SURGERY  2009   Replacement repaired    . MELANOMA EXCISION  2013   right leg  . mirtral valve repair  2008  . ROTATOR CUFF REPAIR  2016  . TOTAL HIP ARTHROPLASTY  2008  . VAGINAL HYSTERECTOMY  2005   Social History:   reports that she  has never smoked. She has never used smokeless tobacco. She reports that she does not drink alcohol and does not use drugs.  Family History  Problem Relation Age of Onset  . COPD Mother   . Emphysema Mother   . Stroke Father        With complications    Medications: Patient's Medications  New Prescriptions   No medications on file  Previous Medications   ACETAMINOPHEN (TYLENOL) 650 MG CR TABLET    Take 650 mg by mouth every 12 (twelve) hours as needed for pain.   AMITRIPTYLINE (ELAVIL) 50 MG TABLET    Take 25 mg by mouth at bedtime.   ASPIRIN EC 81 MG TABLET    Take 81 mg by mouth daily. Swallow whole.    BUMETANIDE (BUMEX) 1 MG TABLET    Take 1 mg by mouth daily.   DIGOXIN (LANOXIN) 0.125 MG TABLET    Take 0.125 mg by mouth daily.   FERROUS SULFATE 325 (65 FE) MG TABLET    Take 325 mg by mouth daily with breakfast.   HYDROCODONE-ACETAMINOPHEN (NORCO) 7.5-325 MG TABLET    Take 1 tablet by mouth as needed.   MEGESTROL (MEGACE) 40 MG/ML SUSPENSION    Take by mouth daily. Take 10 ml by mouth daily   METOCLOPRAMIDE (REGLAN) 5 MG TABLET    Take 1 tablet (5 mg total) by mouth 2 (two) times daily.   MIRTAZAPINE (REMERON) 30 MG TABLET    Take 30 mg by mouth at bedtime.   MULTIPLE VITAMINS-MINERALS (PRESERVISION AREDS 2+MULTI VIT PO)    Take 1 tablet by mouth at bedtime.   ONDANSETRON (ZOFRAN) 4 MG TABLET    Take 4 mg by mouth 3 (three) times daily as needed for nausea or vomiting.   PANTOPRAZOLE (PROTONIX) 40 MG TABLET    Take 1 tablet (40 mg total) by mouth 2 (two) times daily.   POTASSIUM CHLORIDE (KLOR-CON) 20 MEQ PACKET    Take 20 mEq by mouth daily.   SENNA-DOCUSATE (SENOKOT-S) 8.6-50 MG TABLET    Take 2 tablets by mouth daily as needed.   SILDENAFIL (REVATIO) 20 MG TABLET    Take 1 tablet (20 mg total) by mouth 2 (two) times daily.   TRAMADOL (ULTRAM) 50 MG TABLET    Take 1 tablet (50 mg total) by mouth daily.   VITAMIN B-12 (CYANOCOBALAMIN) 1000 MCG TABLET    Take 1,000 mcg by mouth daily.  Modified Medications   No medications on file  Discontinued Medications   No medications on file    Physical Exam:  Vitals:   12/08/20 1344  BP: 98/60  Pulse: 71  Resp: 20  Temp: 97.9 F (36.6 C)  TempSrc: Temporal  SpO2: 93%  Weight: 76 lb 9.6 oz (34.7 kg)  Height: 5\' 2"  (1.575 m)   Body mass index is 14.01 kg/m. Wt Readings from Last 3 Encounters:  12/08/20 76 lb 9.6 oz (34.7 kg)  11/04/20 75 lb 3.2 oz (34.1 kg)  10/21/20 77 lb 3.2 oz (35 kg)    Physical Exam Vitals reviewed.  Constitutional:      General: She is not in acute distress.    Appearance: She is ill-appearing.      Comments: Frail  HENT:     Head: Normocephalic.  Eyes:     General:        Right eye: No discharge.        Left eye: No discharge.  Cardiovascular:     Rate  and Rhythm: Normal rate. Rhythm irregular.     Pulses: Normal pulses.     Heart sounds: Normal heart sounds. No murmur heard.   Pulmonary:     Effort: Pulmonary effort is normal. No respiratory distress.     Breath sounds: Normal breath sounds. No wheezing.  Abdominal:     General: Abdomen is flat. Bowel sounds are normal. There is no distension.     Palpations: Abdomen is soft.     Tenderness: There is no abdominal tenderness.  Musculoskeletal:     Cervical back: Normal range of motion.     Right lower leg: No edema.     Left lower leg: No edema.  Lymphadenopathy:     Cervical: No cervical adenopathy.  Skin:    General: Skin is warm and dry.     Capillary Refill: Capillary refill takes less than 2 seconds.     Comments: Senile purpura scattered throughout body  Neurological:     General: No focal deficit present.     Mental Status: She is alert and oriented to person, place, and time.     Motor: Weakness present.     Gait: Gait abnormal.     Comments: Wheelchair/walker  Psychiatric:        Mood and Affect: Mood normal.        Behavior: Behavior normal.     Labs reviewed: Basic Metabolic Panel: Recent Labs    10/21/20 0000 11/04/20 1247  NA 136 137  K 4.0 4.4  CL 97* 93*  CO2 26 24  GLUCOSE 164* 120*  BUN 25 29  CREATININE 1.02* 1.07*  CALCIUM 8.6 8.9   Liver Function Tests: No results for input(s): AST, ALT, ALKPHOS, BILITOT, PROT, ALBUMIN in the last 8760 hours. No results for input(s): LIPASE, AMYLASE in the last 8760 hours. No results for input(s): AMMONIA in the last 8760 hours. CBC: Recent Labs    10/21/20 0000  WBC 6.5  NEUTROABS 5,005  HGB 10.9*  HCT 32.2*  MCV 92.5  PLT 179   Lipid Panel: No results for input(s): CHOL, HDL, LDLCALC, TRIG, CHOLHDL, LDLDIRECT in the last 8760  hours. TSH: No results for input(s): TSH in the last 8760 hours. A1C: No results found for: HGBA1C   Assessment/Plan: 1. Slow transit constipation - reports some diarrhea, abdomen soft - discontinue senna - docusate sodium (COLACE) 100 MG capsule; Take 1 capsule (100 mg total) by mouth 2 (two) times daily.  Dispense: 60 capsule; Refill: 3  2. Chronic bilateral low back pain without sciatica - increased pain with sitting, she has trouble sleeping due to back pain - traMADol (ULTRAM) 50 MG tablet; Take 1 tablet (50 mg total) by mouth 2 (two) times daily for 5 days.  Dispense: 60 tablet; Refill: 1  3. Dysphagia, unspecified type - barium swallow study 04/22- suggests esophageal motility disorder - reglan 5mg  bid started- daughter reports less gagging - scheduled to follow up with GI  4. Esophageal motility disorder - see above  5. Gallstones - stable no abdominal pain  6. Paroxysmal atrial fibrillation (HCC) - rate controlled with digoxin - cont baby asa daily - not on DOAC due to frequent falls and fraility  7. Underweight due to inadequate caloric intake - weight unchanged - suspect due to swallowing issues  - continues to eat small meals throughout day - cont Boost high calorie shakes  8. Senile purpura (HCC) - stable, no sign of skin breakdown   9. Frail elderly - weight 76  lbs, remains high rick for fall with fracture - cont PT - frustrated with swallowing issues and back pain - palliative care for symptoms management discussed- patient and daughter would like to proceed with GI consult first - cbc/diff- future - cmp- future  Total time: 45 minutes. Greater than 50% of total time spent doing patient counseling and coordination of care regarding chronic back pain, falls safety plan, palliative care.   Next appt: 03/09/2021  Hazle Nordmann, Juel Burrow  [facility] [phone]

## 2020-12-09 ENCOUNTER — Ambulatory Visit: Payer: Medicare Other | Admitting: Orthopedic Surgery

## 2020-12-09 ENCOUNTER — Ambulatory Visit: Payer: Medicare Other | Admitting: Physical Therapy

## 2020-12-09 DIAGNOSIS — D692 Other nonthrombocytopenic purpura: Secondary | ICD-10-CM | POA: Insufficient documentation

## 2020-12-09 DIAGNOSIS — R54 Age-related physical debility: Secondary | ICD-10-CM | POA: Insufficient documentation

## 2020-12-12 ENCOUNTER — Other Ambulatory Visit: Payer: Self-pay

## 2020-12-12 ENCOUNTER — Ambulatory Visit: Payer: Medicare Other | Attending: Orthopedic Surgery

## 2020-12-12 DIAGNOSIS — R2689 Other abnormalities of gait and mobility: Secondary | ICD-10-CM | POA: Insufficient documentation

## 2020-12-12 DIAGNOSIS — M6281 Muscle weakness (generalized): Secondary | ICD-10-CM | POA: Diagnosis not present

## 2020-12-12 DIAGNOSIS — R293 Abnormal posture: Secondary | ICD-10-CM | POA: Insufficient documentation

## 2020-12-12 NOTE — Therapy (Signed)
[facility] [address], [address] [place], [place], [address] Phone: [phone]   Fax:  [phone]  Physical Therapy Treatment  Patient Details  Name: Jaclyn Berry MRN: [id number] Date of Birth: 25-Sep-1926 Referring Provider (PT): [name], NP   Encounter Date: 12/12/2020   PT End of Session - 12/12/20 1222    Visit Number 7    Date for PT Re-Evaluation 01/02/21    Authorization Type Medicare Part A and B, KX at 15 visits    Progress Note Due on Visit 10    PT Start Time 1144    PT Stop Time 1221   rest breaks taken   PT Time Calculation (min) 37 min    Activity Tolerance Patient limited by fatigue    Behavior During Therapy [facility] for tasks assessed/performed           Past Medical History:  Diagnosis Date  . A-fib (HCC)   . Anemia   . Aortic valve stenosis   . Gall stones   . Low BP   . Mitral valve stenosis     Past Surgical History:  Procedure Laterality Date  . FRACTURE SURGERY  2016   Broken Arm  . HIP SURGERY  2009   Replacement repaired    . MELANOMA EXCISION  2013   right leg  . mirtral valve repair  2008  . ROTATOR CUFF REPAIR  2016  . TOTAL HIP ARTHROPLASTY  2008  . VAGINAL HYSTERECTOMY  2005    There were no vitals filed for this visit.   Subjective Assessment - 12/12/20 1145    Subjective I loved the NuStep last time.    Patient Stated Goals be more independent with bed mobility, work on gait with RW, transfers, strength    Currently in Pain? No/denies                             [facility] Adult PT Treatment/Exercise - 12/12/20 0001      Knee/Hip Exercises: Aerobic   Nustep Level 1x 8 minutes.  Rest break and then additional 2 minutes.      Knee/Hip Exercises: Seated   Ball Squeeze x20    Clamshell with TheraBand Green   2 x 10 reps green loop; x10 yellow tband loop   Hamstring Curl Strengthening;Both;10 reps    Hamstring Limitations green loop    Sit to Sand  Other (comment)   x 3 reps with min assistance     Shoulder Exercises: Seated   Row Strengthening;20 reps;Theraband    Theraband Level (Shoulder Row) Level 1 (Yellow)    Flexion Strengthening;20 reps;Both    Abduction Strengthening;15 reps    Other Seated Exercises biceps curls: 2x10 1#                    PT Short Term Goals - 12/07/20 1244      PT SHORT TERM GOAL #1   Title Pt will be able to perform intial HEP with supervision for safety    Status Achieved      PT SHORT TERM GOAL #2   Title Pt will be able to demo bed rolling Lt/Rt with supervision using proper form and muscle groups    Baseline min assist    Status On-going      PT SHORT TERM GOAL #4   Title Pt will be able to ambulate at least 20' using RW with min/mod  SOB and recovery within 1' of sitting    Status On-going             PT Long Term Goals - 11/07/20 1255      PT LONG TERM GOAL #1   Title Pt will improve functional strength to allow for ind/supervision only with bed mobility and chair transfers    Time 8    Period Weeks    Status New    Target Date 01/02/21      PT LONG TERM GOAL #2   Title Pt will be able to demo coordination of step ups and downs with caregiver or PT min assist to simulate entry stairs access (4 stairs no railing) for improved safety.    Time 8    Period Weeks    Status New    Target Date 01/02/21      PT LONG TERM GOAL #3   Title Pt will demo improved awareness of more erect posture with less need for cueing during ther ex for spine posture.    Time 8    Period Weeks    Status New    Target Date 01/02/21      PT LONG TERM GOAL #4   Title Pt will be able to ambulate at least 40' with RW and brief standing or sitting break as needed for SOB secondary to heart condition to demo improved gait endurance for household amb (with supervision)    Time 8    Period Weeks    Status New    Target Date 01/02/21                 Plan - 12/12/20 1223    Clinical  Impression Statement Pt continues to work on endurance, strength and mobility.   Pt's daughter reports that she was able to independently lift her leg onto a coffee table to put on shoes today.  Pt tolerated 8 minutes on the NuStep arms/legs without any breaks.  Pt took a break and then resumed  for 2 minutes.  Pt transferred independently from wheelchair to NuStep today.  Pt is making slow progress due to period of immobility and muscle mass loss recently.  Pt will continue to benefit from skilled PT to address strength, endurance and mobility.    PT Frequency 2x / week    PT Duration 8 weeks    PT Treatment/Interventions ADLs/Self Care Home Management;Moist Heat;Gait training;Stair training;Functional mobility training;Therapeutic activities;Therapeutic exercise;Neuromuscular re-education;Manual techniques;Patient/family education;Passive range of motion;Balance training    PT Next Visit Plan Continue NuStep, attempt short distance ambulation, continue standing and gentle LE strengthening    PT Home Exercise Plan Access Code: [email or web address]    Consulted and Agree with Plan of Care Patient;Family member/caregiver    Family Member Consulted Pt's daughter           Patient will benefit from skilled therapeutic intervention in order to improve the following deficits and impairments:  Abnormal gait,Decreased range of motion,Difficulty walking,Cardiopulmonary status limiting activity,Postural dysfunction,Decreased strength,Decreased mobility,Decreased balance,Improper body mechanics,Pain,Decreased activity tolerance  Visit Diagnosis: Muscle weakness (generalized)  Other abnormalities of gait and mobility  Abnormal posture     Problem List Patient Active Problem List   Diagnosis Date Noted  . Senile purpura (HCC) 12/09/2020  . Frail elderly 12/09/2020  . Unstable gait 10/25/2020  . Paroxysmal atrial fibrillation (HCC) 10/25/2020  . Nonrheumatic mitral valve stenosis 10/25/2020  . Nonrheumatic  aortic valve stenosis 10/25/2020  . Fatigue 10/25/2020  . Generalized abdominal pain  10/25/2020  . Iron deficiency anemia secondary to inadequate dietary iron intake 10/25/2020  . Hypotension 10/25/2020  . Chronic combined systolic and diastolic heart failure (HCC) 10/25/2020  . Chronic bilateral low back pain without sciatica 10/25/2020  . Multiple falls 10/25/2020  . Slow transit constipation 10/25/2020  . Poor appetite 10/25/2020  . Underweight due to inadequate caloric intake 10/25/2020     Lorrene Reid, PT 12/12/20 12:24 PM  [facility] [address], [address] [place], [place], [address] Phone: [phone]   Fax:  [phone]  Name: Jaclyn Berry MRN: [id number] Date of Birth: [birth year]

## 2020-12-14 ENCOUNTER — Other Ambulatory Visit: Payer: Self-pay

## 2020-12-14 ENCOUNTER — Ambulatory Visit: Payer: Medicare Other

## 2020-12-14 DIAGNOSIS — R293 Abnormal posture: Secondary | ICD-10-CM

## 2020-12-14 DIAGNOSIS — M6281 Muscle weakness (generalized): Secondary | ICD-10-CM | POA: Diagnosis not present

## 2020-12-14 DIAGNOSIS — R2689 Other abnormalities of gait and mobility: Secondary | ICD-10-CM

## 2020-12-14 NOTE — Therapy (Signed)
[facility] [address], [address] [place], [place], [address] Phone: [phone]   Fax:  [phone]  Physical Therapy Treatment  Patient Details  Name: Jaclyn Berry MRN: [id number] Date of Birth: 03-15-1927 Referring Provider (PT): [name], NP   Encounter Date: 12/14/2020   PT End of Session - 12/14/20 1140    Visit Number 8    Date for PT Re-Evaluation 01/02/21    Authorization Type Medicare Part A and B, KX at 15 visits    Progress Note Due on Visit 10    PT Start Time 1105    PT Stop Time 1133    PT Time Calculation (min) 28 min    Equipment Utilized During Treatment Gait belt    Activity Tolerance Patient limited by fatigue    Behavior During Therapy WFL for tasks assessed/performed           Past Medical History:  Diagnosis Date  . A-fib (HCC)   . Anemia   . Aortic valve stenosis   . Gall stones   . Low BP   . Mitral valve stenosis     Past Surgical History:  Procedure Laterality Date  . FRACTURE SURGERY  2016   Broken Arm  . HIP SURGERY  2009   Replacement repaired    . MELANOMA EXCISION  2013   right leg  . mirtral valve repair  2008  . ROTATOR CUFF REPAIR  2016  . TOTAL HIP ARTHROPLASTY  2008  . VAGINAL HYSTERECTOMY  2005    There were no vitals filed for this visit.   Subjective Assessment - 12/14/20 1142    Subjective Pt was able to get out of the house with help from 1 person vs 2 today.    Currently in Pain? Yes    Pain Score --   not rated, mentioned it today   Pain Location Buttocks    Pain Orientation Right    Pain Descriptors / Indicators Sharp;Sore    Pain Onset More than a month ago    Pain Frequency Constant    Aggravating Factors  being in bed, sitting,    Pain Relieving Factors change of position                             [facility] Adult PT Treatment/Exercise - 12/14/20 0001      Transfers   Comments 2x3 sit to stand with CGA from PT       Ambulation/Gait   Ambulation/Gait Yes    Ambulation Distance (Feet) 20 Feet    Assistive device Rolling walker    Gait Pattern Decreased step length - right;Decreased step length - left;Right flexed knee in stance;Left flexed knee in stance    Gait Comments fatigue after walking- vitals stable and recovered  quickly      Knee/Hip Exercises: Stretches   Active Hamstring Stretch 2 reps;60 seconds    Active Hamstring Stretch Limitations using foot stool under heel while pt seated.  Pt performed active quad set t oimprove stretch      Knee/Hip Exercises: Seated   Ball Squeeze x20    Marching Limitations rockerboard seated x 3 minutes      Shoulder Exercises: Seated   Flexion Strengthening;20 reps;Both    Other Seated Exercises biceps curls: 2x10 2#                    PT Short Term Goals -  12/07/20 1244      PT SHORT TERM GOAL #1   Title Pt will be able to perform intial HEP with supervision for safety    Status Achieved      PT SHORT TERM GOAL #2   Title Pt will be able to demo bed rolling Lt/Rt with supervision using proper form and muscle groups    Baseline min assist    Status On-going      PT SHORT TERM GOAL #4   Title Pt will be able to ambulate at least 20' using RW with min/mod SOB and recovery within 1' of sitting    Status On-going             PT Long Term Goals - 11/07/20 1255      PT LONG TERM GOAL #1   Title Pt will improve functional strength to allow for ind/supervision only with bed mobility and chair transfers    Time 8    Period Weeks    Status New    Target Date 01/02/21      PT LONG TERM GOAL #2   Title Pt will be able to demo coordination of step ups and downs with caregiver or PT min assist to simulate entry stairs access (4 stairs no railing) for improved safety.    Time 8    Period Weeks    Status New    Target Date 01/02/21      PT LONG TERM GOAL #3   Title Pt will demo improved awareness of more erect posture with less need for  cueing during ther ex for spine posture.    Time 8    Period Weeks    Status New    Target Date 01/02/21      PT LONG TERM GOAL #4   Title Pt will be able to ambulate at least 40' with RW and brief standing or sitting break as needed for SOB secondary to heart condition to demo improved gait endurance for household amb (with supervision)    Time 8    Period Weeks    Status New    Target Date 01/02/21                 Plan - 12/14/20 1123    Clinical Impression Statement Pt continues to work on endurance, strength and mobility.   Pt walked with the walker x 20 feet today and demonstrated significant fatigue with need to rest after.  PT monitored O2 and HR and pt recovered quickly with numbers in normal range throughout.  PT discussed need for knee extension and hamstring stretch to improve posture with standing and walking.   Pt transferred independently from wheelchair to NuStep today and was able to perform 2x 3 reps of sit to stand.  PT present to monitor for fatigue and provide min assist to CGA for safety with standing mobility. Pt is making steady progress although slow due to period of immobility and muscle mass loss recently.  Pt will continue to benefit from skilled PT to address strength, endurance and mobility.    PT Frequency 2x / week    PT Duration 8 weeks    PT Treatment/Interventions ADLs/Self Care Home Management;Moist Heat;Gait training;Stair training;Functional mobility training;Therapeutic activities;Therapeutic exercise;Neuromuscular re-education;Manual techniques;Patient/family education;Passive range of motion;Balance training    PT Next Visit Plan Continue NuStep, attempt short distance ambulation, continue standing and gentle LE strengthening    PT Home Exercise Plan Access Code: [email or web address]    Consulted and  Agree with Plan of Care Patient;Family member/caregiver    Family Member Consulted Pt's daughter           Patient will benefit from skilled therapeutic  intervention in order to improve the following deficits and impairments:  Abnormal gait,Decreased range of motion,Difficulty walking,Cardiopulmonary status limiting activity,Postural dysfunction,Decreased strength,Decreased mobility,Decreased balance,Improper body mechanics,Pain,Decreased activity tolerance  Visit Diagnosis: Other abnormalities of gait and mobility  Muscle weakness (generalized)  Abnormal posture     Problem List Patient Active Problem List   Diagnosis Date Noted  . Senile purpura (HCC) 12/09/2020  . Frail elderly 12/09/2020  . Unstable gait 10/25/2020  . Paroxysmal atrial fibrillation (HCC) 10/25/2020  . Nonrheumatic mitral valve stenosis 10/25/2020  . Nonrheumatic aortic valve stenosis 10/25/2020  . Fatigue 10/25/2020  . Generalized abdominal pain 10/25/2020  . Iron deficiency anemia secondary to inadequate dietary iron intake 10/25/2020  . Hypotension 10/25/2020  . Chronic combined systolic and diastolic heart failure (HCC) 10/25/2020  . Chronic bilateral low back pain without sciatica 10/25/2020  . Multiple falls 10/25/2020  . Slow transit constipation 10/25/2020  . Poor appetite 10/25/2020  . Underweight due to inadequate caloric intake 10/25/2020    Lorrene Reid, PT 12/14/20 11:46 AM  [facility] [address], [address] [place], [place], [address] Phone: [phone]   Fax:  [phone]  Name: Jaclyn Berry MRN: [id number] Date of Birth: 10-13-1926

## 2020-12-15 ENCOUNTER — Ambulatory Visit (INDEPENDENT_AMBULATORY_CARE_PROVIDER_SITE_OTHER): Payer: Medicare Other | Admitting: Physician Assistant

## 2020-12-15 ENCOUNTER — Encounter: Payer: Self-pay | Admitting: Physician Assistant

## 2020-12-15 VITALS — BP 100/60 | Ht 63.0 in | Wt 74.5 lb

## 2020-12-15 DIAGNOSIS — R1310 Dysphagia, unspecified: Secondary | ICD-10-CM | POA: Diagnosis not present

## 2020-12-15 NOTE — Patient Instructions (Addendum)
If you are age 85 or older, your body mass index should be between 23-30. Your Body mass index is 13.2 kg/m. If this is out of the aforementioned range listed, please consider follow up with your Primary Care Provider.  Eat a very soft to pureed diet  Sit upright after meal, and sleep upright at a 45 degree angle.  Drink Boost 3 tmes daily  Continue Pantoprazole 40 mg twice daily.  Continue Reglan 5 mg twice daily.  Follow up as needed.  Thank you for entrusting me with your care and choosing [facility].  Amy Esterwood, PA-C

## 2020-12-16 ENCOUNTER — Other Ambulatory Visit: Payer: Self-pay

## 2020-12-16 MED ORDER — POTASSIUM CHLORIDE CRYS ER 20 MEQ PO TBCR: 20.0000 meq | EXTENDED_RELEASE_TABLET | Freq: Every day | ORAL | 5 refills | Status: AC

## 2020-12-18 ENCOUNTER — Encounter: Payer: Self-pay | Admitting: Physician Assistant

## 2020-12-18 NOTE — Progress Notes (Signed)
Subjective:    Patient ID: Jaclyn Berry, female    DOB: 1926-12-10, 87 y.o.   MRN: [id number]  HPI Jaclyn Berry is a pleasant 87 year old white female, new to GI today referred by Hazle Nordmann, NP/[facility] for further evaluation of dysphagia Patient had recently relocated from [place] to be closer to family.  She has history of congestive heart failure, mitral valve stenosis, aortic valve stenosis, atrial fibrillation, prior history of iron and B12 deficiency, and cholelithiasis.  Notes report that she had ERCP done in February 2021 with stone extraction but still has gallbladder in situ.  She is status post remote hysterectomy 2005, mitral valve repair 2008 right hip replacement 2008.  EF 20 to 25% Patient and daughter both relate that she has had progressive issues with dysphagia since October 2021.  She says she also has completely lost her sense of taste and smell since December 2021.  She has not had COVID to her knowledge and never was symptomatic.  She says that when she tries to eat, she will swallow and food will immediately come back up in her mouth.  This is frequently associated with a lot of phlegm which she has to spit out.  It sounds as if she has some issues manipulating the bolus into the pharynx as well.  Most of the time she is able to handle liquids though takes very small amounts at a time and if she eats very slowly can consume very small amounts of very soft food.  She is extremely thin but her weight has been stable over the past 3 to 4 months.  She is trying to drink to boost supplements per day which she says is an effort and her daughter has been feeding her very soft foods. She did have barium swallow done April 2022 which shows extensive tertiary contractions, no evidence of mass or stricture or ring.  There was no hiatal hernia.  There is intermittent failure to propagate primary peristaltic waves. She has not had prior EGD. She has recently been started on  metoclopramide 5 mg AC, is on Protonix 40 mg twice daily and has been on Megace and Remeron though her daughter says she has not recently been giving her the Megace as this is liquid and she does not like the taste.  Review of Systems Pertinent positive and negative review of systems were noted in the above HPI section.  All other review of systems was otherwise negative.  Outpatient Encounter Medications as of 12/15/2020  Medication Sig  . acetaminophen (TYLENOL) 650 MG CR tablet Take 650 mg by mouth every 12 (twelve) hours as needed for pain.  Marland Kitchen amitriptyline (ELAVIL) 50 MG tablet Take 25 mg by mouth at bedtime.  Marland Kitchen aspirin EC 81 MG tablet Take 81 mg by mouth daily. Swallow whole.  . bumetanide (BUMEX) 1 MG tablet Take 1 mg by mouth daily.  . digoxin (LANOXIN) 0.125 MG tablet Take 0.125 mg by mouth daily.  Marland Kitchen docusate sodium (COLACE) 100 MG capsule Take 1 capsule (100 mg total) by mouth 2 (two) times daily.  . ferrous sulfate 325 (65 FE) MG tablet Take 325 mg by mouth daily with breakfast.  . metoCLOPramide (REGLAN) 5 MG tablet Take 1 tablet (5 mg total) by mouth 2 (two) times daily.  . mirtazapine (REMERON) 30 MG tablet Take 30 mg by mouth at bedtime.  . Multiple Vitamins-Minerals (PRESERVISION AREDS 2+MULTI VIT PO) Take 1 tablet by mouth at bedtime.  . ondansetron (ZOFRAN) 4 MG  tablet Take 4 mg by mouth 3 (three) times daily as needed for nausea or vomiting.  . pantoprazole (PROTONIX) 40 MG tablet Take 1 tablet (40 mg total) by mouth 2 (two) times daily.  . sildenafil (REVATIO) 20 MG tablet Take 1 tablet (20 mg total) by mouth 2 (two) times daily.  . traMADol (ULTRAM) 50 MG tablet Take 50-100 mg by mouth daily.  . vitamin B-12 (CYANOCOBALAMIN) 1000 MCG tablet Take 1,000 mcg by mouth daily.  . [DISCONTINUED] HYDROcodone-acetaminophen (NORCO) 7.5-325 MG tablet Take 1 tablet by mouth as needed.  . [DISCONTINUED] megestrol (MEGACE) 40 MG/ML suspension Take by mouth daily. Take 10 ml by mouth daily   . [DISCONTINUED] potassium chloride (KLOR-CON) 20 MEQ packet Take 20 mEq by mouth daily.   No facility-administered encounter medications on file as of 12/15/2020.   Allergies  Allergen Reactions  . Morphine And Related     Vomit   . Sulfa Antibiotics     Rash    Patient Active Problem List   Diagnosis Date Noted  . Senile purpura (HCC) 12/09/2020  . Frail elderly 12/09/2020  . Unstable gait 10/25/2020  . Paroxysmal atrial fibrillation (HCC) 10/25/2020  . Nonrheumatic mitral valve stenosis 10/25/2020  . Nonrheumatic aortic valve stenosis 10/25/2020  . Fatigue 10/25/2020  . Generalized abdominal pain 10/25/2020  . Iron deficiency anemia secondary to inadequate dietary iron intake 10/25/2020  . Hypotension 10/25/2020  . Chronic combined systolic and diastolic heart failure (HCC) 10/25/2020  . Chronic bilateral low back pain without sciatica 10/25/2020  . Multiple falls 10/25/2020  . Slow transit constipation 10/25/2020  . Poor appetite 10/25/2020  . Underweight due to inadequate caloric intake 10/25/2020   Social History   Socioeconomic History  . Marital status: Widowed    Spouse name: Not on file  . Number of children: Not on file  . Years of education: Not on file  . Highest education level: Not on file  Occupational History  . Not on file  Tobacco Use  . Smoking status: Never Smoker  . Smokeless tobacco: Never Used  Vaping Use  . Vaping Use: Never used  Substance and Sexual Activity  . Alcohol use: Never  . Drug use: Never  . Sexual activity: Not Currently  Other Topics Concern  . Not on file  Social History Narrative   Diet: Regular      Do you drink/ eat things with caffeine? No always drank coffee but not now      Marital status:  Widowed                              What year were you married ? 1954      Do you live in a house, apartment,assistred living, condo, trailer, etc.)? House      Is it one or more stories? 2      How many persons live in  your home ? 4      Do you have any pets in your home ?(please list) Dog, Cat      Highest Level of education completed: High School      Current or past profession: Book Publishing rights manager       Do you exercise?  No                            Type & how often ------------  ADVANCED DIRECTIVES (Please bring copies)      Do you have a living will? Yes      Do you have a DNR form? Yes                      If not, do you want to discuss one?       Do you have signed POA?HPOA forms?   Yes              If so, please bring to your appointment      FUNCTIONAL STATUS- To be completed by Spouse / child / Staff       Do you have difficulty bathing or dressing yourself ? Yes      Do you have difficulty preparing food or eating ? Yes      Do you have difficulty managing your mediation ? Yes      Do you have difficulty managing your finances ? Yes      Do you have difficulty affording your medication ? No      Social Determinants of Corporate investment banker Strain: Not on file  Food Insecurity: Not on file  Transportation Needs: Not on file  Physical Activity: Not on file  Stress: Not on file  Social Connections: Not on file  Intimate Partner Violence: Not on file    Ms. Duford's family history includes COPD in her mother; Emphysema in her mother; Stroke in her father.      Objective:    Vitals:   12/15/20 1132  BP: 100/60    Physical Exam Well-developed well-nourished very elderly white female in no acute distress.  Patient is in a wheelchair, accompanied by her daughter height, Weight, 74 pounds BMI 13.2  HEENT; nontraumatic normocephalic, EOMI, PE R LA, sclera anicteric. Oropharynx; no oral thrush evident Neck; supple, no JVD Cardiovascular; regular rate and rhythm with S1-S2, no murmur rub or gallop Pulmonary; Clear bilaterally Abdomen; soft, scaphoid, nontender, nondistended, no palpable mass or hepatosplenomegaly, bowel sounds are  active Rectal; not done today Skin; benign exam, scattered echymosis Extremities; no clubbing cyanosis or edema skin warm and dry Neuro/Psych; alert and oriented x4, grossly nonfocal mood and affect appropriate       Assessment & Plan:   #15 87 year old white female, chronically ill with congestive heart failure, EF 20 to 25%, history of mitral valve and aortic valve stenosis and atrial fibrillation.  #2  8 to 29-month history of dysphagia with frequent regurgitation of solid foods associated with significant phlegm.  She has had associated weight loss however stable over the past 3 months. Very recent barium swallow shows extensive esophageal dysmotility, no stricture or mass and no hiatal hernia.  #3 malnutrition-multifactoral #4 chronic GERD-continue  daily Protonix #5 history of choledocholithiasis status post ERCP with stone extraction [place] in situ  Plan; long discussion today with patient and daughter regarding esophageal dysmotility. Advise she stay on a full liquid to pured diet, eat very slowly, sip fluids between bites, and avoid most solid foods. Upright positioning for at least 1 hour after meals and keep head of bed elevated 45 degrees Increased boost to 3/day, aim towards calorie dense diet and consider adding protein powder Goal will be for maintenance of weight Continue Protonix 40 mg daily Continue metoclopramide 5 mg 1 hour AC Continue Remeron Advised going back on Megace Happy to see her back, as needed. Patient will be established with Dr. Orvan Falconer  [place] Passage Oswald Hillock PA-C 12/18/2020   Cc: [name], NP

## 2020-12-19 ENCOUNTER — Ambulatory Visit: Payer: Medicare Other | Admitting: Physical Therapy

## 2020-12-19 ENCOUNTER — Other Ambulatory Visit: Payer: Self-pay

## 2020-12-19 ENCOUNTER — Encounter: Payer: Self-pay | Admitting: Physical Therapy

## 2020-12-19 DIAGNOSIS — M6281 Muscle weakness (generalized): Secondary | ICD-10-CM | POA: Diagnosis not present

## 2020-12-19 DIAGNOSIS — R2689 Other abnormalities of gait and mobility: Secondary | ICD-10-CM

## 2020-12-19 DIAGNOSIS — R293 Abnormal posture: Secondary | ICD-10-CM

## 2020-12-19 NOTE — Therapy (Signed)
[facility] [address] [place], [place], [address] Phone: [phone]   Fax:  [phone]  Physical Therapy Treatment  Patient Details  Name: Jaclyn Berry MRN: [id number] Date of Birth: 02/16/1927 Referring Provider (PT): [name], NP   Encounter Date: 12/19/2020   PT End of Session - 12/19/20 1239    Visit Number 9    Date for PT Re-Evaluation 01/02/21    Authorization Type Medicare Part A and B, KX at 15 visits    Progress Note Due on Visit 10    PT Start Time 1152    PT Stop Time 1230    PT Time Calculation (min) 38 min    Equipment Utilized During Treatment Gait belt    Activity Tolerance Patient limited by fatigue    Behavior During Therapy WFL for tasks assessed/performed           Past Medical History:  Diagnosis Date  . A-fib (HCC)   . Anemia   . Aortic valve stenosis   . Arthritis   . Depression   . Gall stones   . Gallstone   . Low BP   . Mitral valve stenosis     Past Surgical History:  Procedure Laterality Date  . [place]ONOSCOPY     [place]. Normal  . [facility]CP  09/[facility]  . FRACTURE SURGERY  2016   Broken Arm  . HIP SURGERY  2009   Replacement repaired    . MELANOMA EXCISION  2013   right leg  . mirtral valve repair  2008  . ROTATOR CUFF REPAIR  2016  . TOTAL HIP ARTHROPLASTY  2008  . VAGINAL HYSTERECTOMY  2005    There were no vitals filed for this visit.   Subjective Assessment - 12/19/20 1153    Subjective Pt's daughter reports Pt is doing better with walking to use the bathroom at home.    Pertinent History PMH: h/o fall, Rt hip replacement with revision after a fall (2008/2009) with ongoing pain since femur fraction/revision, a-fib, reduced ejection fraction    Patient Stated Goals be more independent with bed mobility, work on gait with RW, transfers, strength    Currently in Pain? No/denies                              [facility] Adult PT Treatment/Exercise - 12/19/20 0001      Transfers   Transfers Sit to Stand;Stand to Sit    Sit to Stand 5: Supervision;6: Modified independent (Device/Increase time)    Stand to Sit 5: Supervision;6: Modified independent (Device/Increase time);With upper extremity assist      Ambulation/Gait   Ambulation/Gait Yes    Ambulation Distance (Feet) 20 Feet    Assistive device Rolling walker    Gait Pattern Decreased step length - right;Decreased step length - left;Right flexed knee in stance;Left flexed knee in stance      Self-Care   Self-Care Other Self-Care Comments    Other Self-Care Comments  PT encouraged Pt and her daughter to increase gait distance within home with bathroom trips as energy allows      Exercises   Exercises Shoulder;Elbow;Lumbar;Knee/Hip      Lumbar Exercises: Aerobic   Nustep L1 x 6' PT closely monitored for fatigue, Pt needed brief rest break at 3'      Knee/Hip Exercises: Stretches   Active Hamstring Stretch Left;Right;Both;3 reps;10 seconds;2 reps;60 seconds  Active Hamstring Stretch Limitations using foot stool under heel while pt seated.  Pt performed active quad set t oimprove stretch      Knee/Hip Exercises: Standing   Other Standing Knee Exercises standing at walker with empphasis on quad activation for straight knees 3x10 sec      Knee/Hip Exercises: Seated   Long Arc Quad AROM;Left;Right;10 reps    [employer] x20    Other Seated Knee/Hip Exercises toe raises x 10    Other Seated Knee/Hip Exercises heel raises x 10    Marching Limitations rockerboard seated x 3 minutes    Sit to Sand 3 sets;with UE support                    PT Short Term Goals - 12/19/20 1245      PT SHORT TERM GOAL #1   Title Pt will be able to perform intial HEP with supervision for safety    Status Achieved      PT SHORT TERM GOAL #2   Title Pt will be able to demo bed rolling Lt/Rt with supervision  using proper form and muscle groups    Baseline verbal report of this being done at home    Status Achieved      PT SHORT TERM GOAL #3   Title Pt will report at least 30% greater ease and confidence with bed mobility and transfers with supervision/min assist as needed    Status On-going      PT SHORT TERM GOAL #4   Title Pt will be able to ambulate at least 20' using RW with min/mod SOB and recovery within 1' of sitting    Status Achieved             PT Long Term Goals - 12/19/20 1246      PT LONG TERM GOAL #1   Title Pt will improve functional strength to allow for ind/supervision only with bed mobility and chair transfers    Status On-going      PT LONG TERM GOAL #2   Title Pt will be able to demo coordination of step ups and downs with caregiver or PT min assist to simulate entry stairs access (4 stairs no railing) for improved safety.    Status New      PT LONG TERM GOAL #3   Title Pt will demo improved awareness of more erect posture with less need for cueing during ther ex for spine posture.    Status On-going      PT LONG TERM GOAL #4   Title Pt will be able to ambulate at least 40' with RW and brief standing or sitting break as needed for SOB secondary to heart condition to demo improved gait endurance for household amb (with supervision)    Baseline 20'    Status On-going                 Plan - 12/19/20 1239    Clinical Impression Statement Pt with improving ability to participate in PT for endurance, transfers, strength and functional mobility.  Pt's daughter reported Pt is walking more from hospital bed to bathroom at home using walker vs wheelchair.  PT encouraged Pt and Pt's daughter to add some gait distance at home during bathroom trips as Pt's energy allows.  She was able to ambulate x 20' with rolling walker end of session today after already fatigued from NuStep and seated ther ex.  She needed brief break on NuStep  between 2x3' efforts.  Standing knee  extension ROM is improving with focused seated hamstring stretches.  She returns to flexed knees with gait secondary to LE weakness.  Pt demos more ind with sit to stand and stand to sit with PT providing close supervision and light CGA for safety.  Pt making slow progress secondary to recent period of immobility and loss of muscle mass.  She will continue to benefit from skilled PT to address endurance, functional mobility and strength to improve ind, safety and QOL.    Comorbidities a-fib, Rt THR with femur fracture revision    PT Frequency 2x / week    PT Duration 8 weeks    PT Treatment/Interventions ADLs/Self Care Home Management;Moist Heat;Gait training;Stair training;Functional mobility training;Therapeutic activities;Therapeutic exercise;Neuromuscular re-education;Manual techniques;Patient/family education;Passive range of motion;Balance training    PT Next Visit Plan 10th visit PN next visit, did Pt try more indoor ambulation with bathroom trips, continue gait endurance, gentle UE/LE strength, and increase standing time as able    PT Home Exercise Plan Access Code: [email or web address]    Consulted and Agree with Plan of Care Patient;Family member/caregiver    Family Member Consulted Pt's daughter           Patient will benefit from skilled therapeutic intervention in order to improve the following deficits and impairments:     Visit Diagnosis: Other abnormalities of gait and mobility  Muscle weakness (generalized)  Abnormal posture     Problem List Patient Active Problem List   Diagnosis Date Noted  . Senile purpura (HCC) 12/09/2020  . Frail elderly 12/09/2020  . Unstable gait 10/25/2020  . Paroxysmal atrial fibrillation (HCC) 10/25/2020  . Nonrheumatic mitral valve stenosis 10/25/2020  . Nonrheumatic aortic valve stenosis 10/25/2020  . Fatigue 10/25/2020  . Generalized abdominal pain 10/25/2020  . Iron deficiency anemia secondary to inadequate dietary iron intake 10/25/2020  .  Hypotension 10/25/2020  . Chronic combined systolic and diastolic heart failure (HCC) 10/25/2020  . Chronic bilateral low back pain without sciatica 10/25/2020  . Multiple falls 10/25/2020  . Slow transit constipation 10/25/2020  . Poor appetite 10/25/2020  . Underweight due to inadequate caloric intake 10/25/2020    Sade Mehlhoff E Mckaela Howley 12/19/2020, 12:48 PM  [facility] [address] [place], [place], [address] Phone: [phone]   Fax:  [phone]  Name: Jaclyn Berry MRN: [id number] Date of Birth: 07-Aug-1927

## 2020-12-19 NOTE — Progress Notes (Signed)
Reviewed and agree with management plans. ? ?Domanique Luckett L. Sufian Ravi, MD, MPH  ?

## 2020-12-21 ENCOUNTER — Other Ambulatory Visit: Payer: Self-pay

## 2020-12-21 ENCOUNTER — Ambulatory Visit: Payer: Medicare Other

## 2020-12-21 DIAGNOSIS — R2689 Other abnormalities of gait and mobility: Secondary | ICD-10-CM

## 2020-12-21 DIAGNOSIS — R293 Abnormal posture: Secondary | ICD-10-CM

## 2020-12-21 DIAGNOSIS — M6281 Muscle weakness (generalized): Secondary | ICD-10-CM

## 2020-12-21 NOTE — Therapy (Signed)
[facility] [address] [place], [place], [address] Phone: [phone]   Fax:  [phone]  Physical Therapy Treatment  Patient Details  Name: Jaclyn Berry MRN: [id number] Date of Birth: 04-Oct-1926 Referring Provider (PT): [name], NP   Encounter Date: 12/21/2020 Progress Note Reporting Period 11/07/20 to 12/21/20  See note below for Objective Data and Assessment of Progress/Goals.       PT End of Session - 12/21/20 1307    Visit Number 10    Date for PT Re-Evaluation 01/02/21    Authorization Type Medicare Part A and B, [place] at 15 visits    Progress Note Due on Visit 10    PT Start Time 1233   rest breaks taken   PT Stop Time 1310    PT Time Calculation (min) 37 min    Equipment Utilized During Treatment Gait belt    Activity Tolerance Patient limited by fatigue    Behavior During Therapy WFL for tasks assessed/performed           Past Medical History:  Diagnosis Date  . A-fib (HCC)   . Anemia   . Aortic valve stenosis   . Arthritis   . Depression   . Gall stones   . Gallstone   . Low BP   . Mitral valve stenosis     Past Surgical History:  Procedure Laterality Date  . [place]OSCOPY     [place]. Normal  . ERCP  09/2019   [facility]  . FRACTURE SURGERY  2016   Broken Arm  . HIP SURGERY  2009   Replacement repaired    . MELANOMA EXCISION  2013   right leg  . mirtral valve repair  2008  . ROTATOR CUFF REPAIR  2016  . TOTAL HIP ARTHROPLASTY  2008  . VAGINAL HYSTERECTOMY  2005    There were no vitals filed for this visit.   Subjective Assessment - 12/21/20 1235    Subjective I've been walking off and on at home.  I was tired after last session but not too tired.    Currently in Pain? No/denies                             [facility] Adult PT Treatment/Exercise - 12/21/20 0001      Transfers   Transfers Sit to Stand;Stand to Sit    Sit to Stand  5: Supervision;6: Modified independent (Device/Increase time)    Stand to Sit 5: Supervision;6: Modified independent (Device/Increase time);With upper extremity assist      Ambulation/Gait   Ambulation/Gait Yes    Ambulation Distance (Feet) 50 Feet   2x40 then 10   Assistive device Rolling walker    Gait Pattern Decreased step length - right;Decreased step length - left;Right flexed knee in stance;Left flexed knee in stance    Gait Comments fatigue after walking- vitals stable and recovered  quickly      Lumbar Exercises: Aerobic   Nustep L1 x 6' PT closely monitored for fatigue, Pt needed brief rest break at 3'      Knee/Hip Exercises: Stretches   Active Hamstring Stretch Left;Right;Both;3 reps;10 seconds;2 reps;60 seconds    Active Hamstring Stretch Limitations using foot stool under heel while pt seated.  Pt performed active quad set t oimprove stretch      Knee/Hip Exercises: Standing   Other Standing Knee Exercises standing at edge of treadmill.  Alternating  taps on edge of treadmill to improve ability to lift leg independently with negotiating steps      Knee/Hip Exercises: Seated   Long Arc Quad AROM;Left;Right;10 reps    [employer] Limitations rockerboard seated x 3 minutes    Sit to [employer] 1 set;5 reps;with UE support                    PT Short Term Goals - 12/19/20 1245      PT SHORT TERM GOAL #1   Title Pt will be able to perform intial HEP with supervision for safety    Status Achieved      PT SHORT TERM GOAL #2   Title Pt will be able to demo bed rolling Lt/Rt with supervision using proper form and muscle groups    Baseline verbal report of this being done at home    Status Achieved      PT SHORT TERM GOAL #3   Title Pt will report at least 30% greater ease and confidence with bed mobility and transfers with supervision/min assist as needed    Status On-going      PT SHORT TERM GOAL #4   Title Pt will be able to ambulate at least 20'  using RW with min/mod SOB and recovery within 1' of sitting    Status Achieved             PT Long Term Goals - 12/21/20 1247      PT LONG TERM GOAL #1   Title Pt will improve functional strength to allow for ind/supervision only with bed mobility and chair transfers    Baseline SBA to min A dependeing on the day    Status On-going      PT LONG TERM GOAL #2   Title Pt will be able to demo coordination of step ups and downs with caregiver or PT min assist to simulate entry stairs access (4 stairs no railing) for improved safety.    Baseline is doing at home with assit x 1 person.    Status On-going      PT LONG TERM GOAL #3   Title Pt will demo improved awareness of more erect posture with less need for cueing during ther ex for spine posture.    Baseline shortened muscle length, knees flexed and trunk flexed    Status On-going      PT LONG TERM GOAL #4   Title Pt will be able to ambulate at least 40' with RW and brief standing or sitting break as needed for SOB secondary to heart condition to demo improved gait endurance for household amb (with supervision)    Baseline 20'    Status On-going                 Plan - 12/21/20 1305    Clinical Impression Statement Pt with improving ability to participate in PT for endurance, transfers, strength and functional mobility.  Pt's daughter reported Pt is walking more from hospital bed to bathroom at home using walker vs wheelchair.  Pt was able to walk 2x20 feet with a short rest break between.  Pt transfers with max UE support with CGA by PT on gait belt.  Standing knee extension ROM is improving with focused seated hamstring stretches.  She returns to flexed knees with gait secondary to LE weakness particularly Rt LE weakness.   Pt making slow progress secondary to recent period of immobility and  loss of muscle mass.  She will continue to benefit from skilled PT to address endurance, functional mobility and strength to improve ind,  safety and QOL.    PT Frequency 2x / week    PT Duration 8 weeks    PT Treatment/Interventions ADLs/Self Care Home Management;Moist Heat;Gait training;Stair training;Functional mobility training;Therapeutic activities;Therapeutic exercise;Neuromuscular re-education;Manual techniques;Patient/family education;Passive range of motion;Balance training    PT Next Visit Plan gait, UE/LE strength, improve standing time    PT Home Exercise Plan Access Code: [email or web address]    Consulted and Agree with Plan of Care Patient;Family member/caregiver           Patient will benefit from skilled therapeutic intervention in order to improve the following deficits and impairments:  Abnormal gait,Decreased range of motion,Difficulty walking,Cardiopulmonary status limiting activity,Postural dysfunction,Decreased strength,Decreased mobility,Decreased balance,Improper body mechanics,Pain,Decreased activity tolerance  Visit Diagnosis: Other abnormalities of gait and mobility  Abnormal posture  Muscle weakness (generalized)     Problem List Patient Active Problem List   Diagnosis Date Noted  . Senile purpura (HCC) 12/09/2020  . Frail elderly 12/09/2020  . Unstable gait 10/25/2020  . Paroxysmal atrial fibrillation (HCC) 10/25/2020  . Nonrheumatic mitral valve stenosis 10/25/2020  . Nonrheumatic aortic valve stenosis 10/25/2020  . Fatigue 10/25/2020  . Generalized abdominal pain 10/25/2020  . Iron deficiency anemia secondary to inadequate dietary iron intake 10/25/2020  . Hypotension 10/25/2020  . Chronic combined systolic and diastolic heart failure (HCC) 10/25/2020  . Chronic bilateral low back pain without sciatica 10/25/2020  . Multiple falls 10/25/2020  . Slow transit constipation 10/25/2020  . Poor appetite 10/25/2020  . Underweight due to inadequate caloric intake 10/25/2020     Lorrene Reid, PT 12/21/20 1:16 PM  [facility] [address], [address] [place], [place], [address] Phone: [phone]   Fax:  [phone]  Name: Jaclyn Berry MRN: [id number] Date of Birth: 05-11-1927

## 2020-12-26 ENCOUNTER — Other Ambulatory Visit: Payer: Self-pay

## 2020-12-26 ENCOUNTER — Ambulatory Visit: Payer: Medicare Other

## 2020-12-26 DIAGNOSIS — R293 Abnormal posture: Secondary | ICD-10-CM

## 2020-12-26 DIAGNOSIS — M6281 Muscle weakness (generalized): Secondary | ICD-10-CM

## 2020-12-26 DIAGNOSIS — R2689 Other abnormalities of gait and mobility: Secondary | ICD-10-CM

## 2020-12-26 MED ORDER — DIGOXIN 125 MCG PO TABS: 0.1250 mg | ORAL_TABLET | Freq: Every day | ORAL | 1 refills | Status: AC

## 2020-12-26 MED ORDER — MIRTAZAPINE 30 MG PO TABS: 30.0000 mg | ORAL_TABLET | Freq: Every day | ORAL | 1 refills | Status: AC

## 2020-12-26 NOTE — Therapy (Signed)
[facility] [address] [place], [place], [address] Phone: [phone]   Fax:  [phone]  Physical Therapy Treatment  Patient Details  Name: Jaclyn Berry MRN: [id number] Date of Birth: 07-31-1927 Referring Provider (PT): [name], NP   Encounter Date: 12/26/2020   PT End of Session - 12/26/20 1229    Visit Number 11    Date for PT Re-Evaluation 01/02/21    Authorization Type Medicare Part A and B, [place] at 15 visits    Progress Note Due on Visit 20    PT Start Time 1147    PT Stop Time 1223    PT Time Calculation (min) 36 min    Activity Tolerance Patient limited by fatigue    Behavior During Therapy [facility] for tasks assessed/performed           Past Medical History:  Diagnosis Date  . A-fib (HCC)   . Anemia   . Aortic valve stenosis   . Arthritis   . Depression   . Gall stones   . Gallstone   . Low BP   . Mitral valve stenosis     Past Surgical History:  Procedure Laterality Date  . [place]OSCOPY     [place]. Normal  . ERCP  09/2019   [facility]  . FRACTURE SURGERY  2016   Broken Arm  . HIP SURGERY  2009   Replacement repaired    . MELANOMA EXCISION  2013   right leg  . mirtral valve repair  2008  . ROTATOR CUFF REPAIR  2016  . TOTAL HIP ARTHROPLASTY  2008  . VAGINAL HYSTERECTOMY  2005    There were no vitals filed for this visit.   Subjective Assessment - 12/26/20 1148    Subjective I had a quiet weekend.    Patient Stated Goals be more independent with bed mobility, work on gait with RW, transfers, strength    Currently in Pain? No/denies                             [facility] Adult PT Treatment/Exercise - 12/26/20 0001      Lumbar Exercises: Aerobic   Nustep L1 x 7' PT closely monitored for fatigue      Knee/Hip Exercises: Seated   Long Arc Quad AROM;Left;Right;10 reps    [employer] Limitations rockerboard seated x 3  minutes    Sit to [employer] 1 set;5 reps;with UE support   fatige and needed to rest     Shoulder Exercises: ROM/Strengthening   Other ROM/Strengthening Exercises biceps curls 2#x 15                    PT Short Term Goals - 12/26/20 1151      PT SHORT TERM GOAL #1   Title Pt will be able to perform intial HEP with supervision for safety    Status Achieved      PT SHORT TERM GOAL #2   Title Pt will be able to demo bed rolling Lt/Rt with supervision using proper form and muscle groups    Status Achieved      PT SHORT TERM GOAL #3   Title Pt will report at least 30% greater ease and confidence with bed mobility and transfers with supervision/min assist as needed    Status Achieved      PT SHORT TERM GOAL #4  Title Pt will be able to ambulate at least 20' using RW with min/mod SOB and recovery within 1' of sitting    Status Achieved             PT Long Term Goals - 12/21/20 1247      PT LONG TERM GOAL #1   Title Pt will improve functional strength to allow for ind/supervision only with bed mobility and chair transfers    Baseline SBA to min A dependeing on the day    Status On-going      PT LONG TERM GOAL #2   Title Pt will be able to demo coordination of step ups and downs with caregiver or PT min assist to simulate entry stairs access (4 stairs no railing) for improved safety.    Baseline is doing at home with assit x 1 person.    Status On-going      PT LONG TERM GOAL #3   Title Pt will demo improved awareness of more erect posture with less need for cueing during ther ex for spine posture.    Baseline shortened muscle length, knees flexed and trunk flexed    Status On-going      PT LONG TERM GOAL #4   Title Pt will be able to ambulate at least 40' with RW and brief standing or sitting break as needed for SOB secondary to heart condition to demo improved gait endurance for household amb (with supervision)    Baseline 20'    Status On-going                  Plan - 12/26/20 1229    Clinical Impression Statement Pt with improving ability to participate in PT for endurance, transfers, strength and functional mobility.  Pt was more fatigued at the end last week and was not able to do as much at home.  Pt was not able to do any walking today and was very fatigued with sit to stand.  Session ended early due to fatigue.   Pt making slow progress secondary to recent period of immobility and loss of muscle mass.  She will continue to benefit from skilled PT to address endurance, functional mobility and strength to improve ind, safety and QOL.    PT Frequency 2x / week    PT Duration 8 weeks    PT Treatment/Interventions ADLs/Self Care Home Management;Moist Heat;Gait training;Stair training;Functional mobility training;Therapeutic activities;Therapeutic exercise;Neuromuscular re-education;Manual techniques;Patient/family education;Passive range of motion;Balance training    PT Next Visit Plan gait, UE/LE strength, improve standing time    PT Home Exercise Plan Access Code: [email or web address]    Consulted and Agree with Plan of Care Patient;Family member/caregiver    Family Member Consulted Pt's daughter           Patient will benefit from skilled therapeutic intervention in order to improve the following deficits and impairments:  Abnormal gait,Decreased range of motion,Difficulty walking,Cardiopulmonary status limiting activity,Postural dysfunction,Decreased strength,Decreased mobility,Decreased balance,Improper body mechanics,Pain,Decreased activity tolerance  Visit Diagnosis: Other abnormalities of gait and mobility  Abnormal posture  Muscle weakness (generalized)     Problem List Patient Active Problem List   Diagnosis Date Noted  . Senile purpura (HCC) 12/09/2020  . Frail elderly 12/09/2020  . Unstable gait 10/25/2020  . Paroxysmal atrial fibrillation (HCC) 10/25/2020  . Nonrheumatic mitral valve stenosis 10/25/2020  . Nonrheumatic  aortic valve stenosis 10/25/2020  . Fatigue 10/25/2020  . Generalized abdominal pain 10/25/2020  . Iron deficiency anemia secondary to inadequate dietary iron  intake 10/25/2020  . Hypotension 10/25/2020  . Chronic combined systolic and diastolic heart failure (HCC) 10/25/2020  . Chronic bilateral low back pain without sciatica 10/25/2020  . Multiple falls 10/25/2020  . Slow transit constipation 10/25/2020  . Poor appetite 10/25/2020  . Underweight due to inadequate caloric intake 10/25/2020     Lorrene Reid, PT 12/26/20 12:31 PM  [facility] [address] [place], [place], [address] Phone: [phone]   Fax:  [phone]  Name: Jaclyn Berry MRN: [id number] Date of Birth: 1926-12-08

## 2020-12-26 NOTE — Telephone Encounter (Signed)
Patient daughter 'Casper Harrison" called and states that patient needs refills on medication Digoxin, and Remeron. Patient hasn't had medications filled by PCP [name], NP yet. Medications pend and sent to PCP [name], NP for approval.

## 2020-12-28 ENCOUNTER — Encounter: Payer: Medicare Other | Admitting: Physical Therapy

## 2020-12-29 ENCOUNTER — Other Ambulatory Visit: Payer: Self-pay

## 2020-12-29 ENCOUNTER — Ambulatory Visit: Payer: Medicare Other

## 2020-12-29 DIAGNOSIS — R293 Abnormal posture: Secondary | ICD-10-CM

## 2020-12-29 DIAGNOSIS — R2689 Other abnormalities of gait and mobility: Secondary | ICD-10-CM

## 2020-12-29 DIAGNOSIS — M6281 Muscle weakness (generalized): Secondary | ICD-10-CM

## 2020-12-29 NOTE — Therapy (Signed)
[facility] [address], [address] [place], [place], [address] Phone: [phone]   Fax:  [phone]  Physical Therapy Treatment  Patient Details  Name: Jaclyn Berry MRN: [id number] Date of Birth: 1926/08/25 Referring Provider (PT): [name], NP   Encounter Date: 12/29/2020   PT End of Session - 12/29/20 1140    Visit Number 12    Date for PT Re-Evaluation 01/02/21    Authorization Type Medicare Part A and B, KX at 15 visits    Progress Note Due on Visit 20    PT Start Time 1103    PT Stop Time 1143    PT Time Calculation (min) 40 min    Equipment Utilized During Treatment Gait belt    Activity Tolerance Patient limited by fatigue    Behavior During Therapy WFL for tasks assessed/performed           Past Medical History:  Diagnosis Date  . A-fib (HCC)   . Anemia   . Aortic valve stenosis   . Arthritis   . Depression   . Gall stones   . Gallstone   . Low BP   . Mitral valve stenosis     Past Surgical History:  Procedure Laterality Date  . COLONOSCOPY     [place]. Normal  . ERCP  09/[facility]  . FRACTURE SURGERY  2016   Broken Arm  . HIP SURGERY  2009   Replacement repaired    . MELANOMA EXCISION  2013   right leg  . mirtral valve repair  2008  . ROTATOR CUFF REPAIR  2016  . TOTAL HIP ARTHROPLASTY  2008  . VAGINAL HYSTERECTOMY  2005    There were no vitals filed for this visit.   Subjective Assessment - 12/29/20 1113    Subjective Pt's daughter reports that she is having difficulty clearing her leg on the step with coming in/out of the house.  Pt was fatigued after last session.    Patient Stated Goals be more independent with bed mobility, work on gait with RW, transfers, strength    Currently in Pain? No/denies                             [facility] Adult PT Treatment/Exercise - 12/29/20 0001      Ambulation/Gait   Ambulation/Gait Yes     Ambulation Distance (Feet) 30 Feet   2x15 feet with seated rest break.  CGA on gait belt for safety   Assistive device Rolling walker    Gait Pattern Decreased step length - right;Decreased step length - left;Right flexed knee in stance;Left flexed knee in stance    Pre-Gait Activities alternating taps on 6" steps- 1 UE support and contact guard assistance by PT on gait belt.    Gait Comments fatigue after walking- vitals stable and recovered  quickly      Lumbar Exercises: Aerobic   Nustep L1 x 7' PT closely monitored for fatigue      Knee/Hip Exercises: Seated   Ball Squeeze x20    Marching Limitations rockerboard seated x 3 minutes    Hamstring Curl Strengthening;Both;20 reps    Hamstring Limitations blue loop                    PT Short Term Goals - 12/26/20 1151      PT SHORT TERM GOAL #1   Title Pt will  be able to perform intial HEP with supervision for safety    Status Achieved      PT SHORT TERM GOAL #2   Title Pt will be able to demo bed rolling Lt/Rt with supervision using proper form and muscle groups    Status Achieved      PT SHORT TERM GOAL #3   Title Pt will report at least 30% greater ease and confidence with bed mobility and transfers with supervision/min assist as needed    Status Achieved      PT SHORT TERM GOAL #4   Title Pt will be able to ambulate at least 20' using RW with min/mod SOB and recovery within 1' of sitting    Status Achieved             PT Long Term Goals - 12/21/20 1247      PT LONG TERM GOAL #1   Title Pt will improve functional strength to allow for ind/supervision only with bed mobility and chair transfers    Baseline SBA to min A dependeing on the day    Status On-going      PT LONG TERM GOAL #2   Title Pt will be able to demo coordination of step ups and downs with caregiver or PT min assist to simulate entry stairs access (4 stairs no railing) for improved safety.    Baseline is doing at home with assit x 1 person.     Status On-going      PT LONG TERM GOAL #3   Title Pt will demo improved awareness of more erect posture with less need for cueing during ther ex for spine posture.    Baseline shortened muscle length, knees flexed and trunk flexed    Status On-going      PT LONG TERM GOAL #4   Title Pt will be able to ambulate at least 40' with RW and brief standing or sitting break as needed for SOB secondary to heart condition to demo improved gait endurance for household amb (with supervision)    Baseline 20'    Status On-going                 Plan - 12/29/20 1124    Clinical Impression Statement Pt with improving ability to participate in PT for endurance, transfers, strength and functional mobility.  Pt continues to report fatigue but was able to perform more standing activity today.   Pt is challenged with lifting leg to edge of step to simulate walking up the steps at home.    Pt making slow progress secondary to recent period of immobility and loss of muscle mass.  She will continue to benefit from skilled PT to address endurance, functional mobility and strength to improve ind, safety and QOL.    PT Frequency 2x / week    PT Duration 8 weeks    PT Treatment/Interventions ADLs/Self Care Home Management;Moist Heat;Gait training;Stair training;Functional mobility training;Therapeutic activities;Therapeutic exercise;Neuromuscular re-education;Manual techniques;Patient/family education;Passive range of motion;Balance training    PT Next Visit Plan Reassessment next visit- probable reduction to 1x/wk (already scheduled), gait, UE/LE strength, improve standing time    PT Home Exercise Plan Access Code: [email or web address]    Recommended Other Services initial cert is signed    Consulted and Agree with Plan of Care Patient;Family member/caregiver           Patient will benefit from skilled therapeutic intervention in order to improve the following deficits and impairments:  Abnormal gait,Decreased range  of motion,Difficulty walking,Cardiopulmonary status limiting activity,Postural dysfunction,Decreased strength,Decreased mobility,Decreased balance,Improper body mechanics,Pain,Decreased activity tolerance  Visit Diagnosis: Abnormal posture  Other abnormalities of gait and mobility  Muscle weakness (generalized)     Problem List Patient Active Problem List   Diagnosis Date Noted  . Senile purpura (HCC) 12/09/2020  . Frail elderly 12/09/2020  . Unstable gait 10/25/2020  . Paroxysmal atrial fibrillation (HCC) 10/25/2020  . Nonrheumatic mitral valve stenosis 10/25/2020  . Nonrheumatic aortic valve stenosis 10/25/2020  . Fatigue 10/25/2020  . Generalized abdominal pain 10/25/2020  . Iron deficiency anemia secondary to inadequate dietary iron intake 10/25/2020  . Hypotension 10/25/2020  . Chronic combined systolic and diastolic heart failure (HCC) 10/25/2020  . Chronic bilateral low back pain without sciatica 10/25/2020  . Multiple falls 10/25/2020  . Slow transit constipation 10/25/2020  . Poor appetite 10/25/2020  . Underweight due to inadequate caloric intake 10/25/2020   Lorrene Reid, PT 12/29/20 11:42 AM  [facility] [address] [place], [place], [address] Phone: [phone]   Fax:  [phone]  Name: Jaclyn Berry MRN: [id number] Date of Birth: 08/27/1926

## 2021-01-02 ENCOUNTER — Encounter: Payer: Self-pay | Admitting: Physical Therapy

## 2021-01-02 ENCOUNTER — Ambulatory Visit: Payer: Medicare Other | Admitting: Physical Therapy

## 2021-01-02 ENCOUNTER — Other Ambulatory Visit: Payer: Self-pay

## 2021-01-02 DIAGNOSIS — R293 Abnormal posture: Secondary | ICD-10-CM

## 2021-01-02 DIAGNOSIS — R2689 Other abnormalities of gait and mobility: Secondary | ICD-10-CM

## 2021-01-02 DIAGNOSIS — M6281 Muscle weakness (generalized): Secondary | ICD-10-CM | POA: Diagnosis not present

## 2021-01-02 NOTE — Therapy (Signed)
[facility] [address], [place], [place], [address] Phone: [phone]   Fax:  [phone]  Physical Therapy Treatment  Patient Details  Name: Jaclyn Berry MRN: [id number] Date of Birth: 29-Apr-1927 Referring Provider (PT): Yvonna Alanis, NP   Encounter Date: 01/02/2021   PT End of Session - 01/02/21 1103    Visit Number 13    Date for PT Re-Evaluation 02/27/21    Authorization Type Medicare Part A and B, KX at 15 visits    Progress Note Due on Visit 20    PT Start Time 1105    PT Stop Time 1145    PT Time Calculation (min) 40 min    Equipment Utilized During Treatment Gait belt    Activity Tolerance Patient limited by fatigue    Behavior During Therapy WFL for tasks assessed/performed           Past Medical History:  Diagnosis Date  . A-fib ([place])   . Anemia   . Aortic valve stenosis   . Arthritis   . Depression   . Gall stones   . Gallstone   . Low BP   . Mitral valve stenosis     Past Surgical History:  Procedure Laterality Date  . COLONOSCOPY     [place]. Normal  . ERCP  09/[facility]  . FRACTURE SURGERY  2016   Broken Arm  . HIP SURGERY  2009   Replacement repaired    . MELANOMA EXCISION  2013   right leg  . mirtral valve repair  2008  . ROTATOR CUFF REPAIR  2016  . TOTAL HIP ARTHROPLASTY  2008  . VAGINAL HYSTERECTOMY  2005    There were no vitals filed for this visit.   Subjective Assessment - 01/02/21 1116    Subjective Pt reports she is getting better with PT.  She is sprinkling movement from HEP throghout her day.  She is not walking too much at home but when she does she is only able to do what has to be done and no more.  Pt's daughter reports they have her sit on rollator chair and push her when too tired and Pt is too weak to help clear her feet and feet get caught.  Still using 2 people to go up/down 4 stairs to get in/out of home.    Pertinent  History PMH: h/o fall, Rt hip replacement with revision after a fall (2008/2009) with ongoing pain since femur fraction/revision, a-fib, reduced ejection fraction    Patient Stated Goals be more independent with bed mobility, work on gait with RW, transfers, strength    Currently in Pain? No/denies              [facility] PT Assessment - 01/02/21 0001      Assessment   Medical Diagnosis R26.81 (ICD-10-CM) - Unstable gait    Referring Provider (PT) Cleophas Dunker, Amy E, NP    Onset Date/Surgical Date --   last year since fall   Hand Dominance Right    Next MD Visit 1 month    Prior Therapy no      Precautions   Precautions Fall    Precaution Comments reduced body weight due to low caloric intake      Posture/Postural Control   Posture/Postural Control Postural limitations    Postural Limitations Rounded Shoulders;Forward head;Increased thoracic kyphosis;Flexed trunk;Posterior pelvic tilt      Transfers   Transfers Sit to Stand;Stand to  Sit    Sit to Stand 5: Supervision;6: Modified independent (Device/Increase time)    Stand to Sit 5: Supervision;6: Modified independent (Device/Increase time);With upper extremity assist      Ambulation/Gait   Ambulation/Gait Yes    Assistive device Rolling walker    Gait Pattern Right flexed knee in stance;Left flexed knee in stance;Poor foot clearance - left;Poor foot clearance - right;Decreased weight shift to right;Decreased step length - left;Decreased step length - right                         [facility] Adult PT Treatment/Exercise - 01/02/21 0001      Ambulation/Gait   Ambulation Distance (Feet) 33 Feet   then 20 second round   Pre-Gait Activities stand tall, Pt able to achieve full ext of Lt knee and neutral hips bil    Gait Comments fatigue with walking, fatigue is barrier 1, Rt knee pain secondary limitation      Self-Care   Self-Care Other Self-Care Comments    Other Self-Care Comments  strategies for foot clearance when being  pushed on rollator seat (use strap for UE assist, hook Lt foot under Rt, practice LAQ and static hold LAQ for functional practice)      Exercises   Exercises Lumbar;Knee/Hip;Shoulder      Knee/Hip Exercises: Seated   Long Arc Quad Both;3 sets;5 reps    Long Arc Quad Limitations practiced static hold of LAQ for endurance for clearing feet when sitting on rollator seat and being pushed by caregiver, 3x10 sec on Rt    Other Seated Knee/Hip Exercises seated march with UE assist using strap on Rt, ind on Lt x 10 reps    Marching Limitations rockerboard seated x 3 minutes                    PT Short Term Goals - 01/02/21 1110      PT SHORT TERM GOAL #1   Title Pt will be able to perform intial HEP with supervision for safety    Status Achieved      PT SHORT TERM GOAL #2   Title Pt will be able to demo bed rolling Lt/Rt with supervision using proper form and muscle groups    Status Achieved      PT SHORT TERM GOAL #3   Title Pt will report at least 30% greater ease and confidence with bed mobility and transfers with supervision/min assist as needed    Status Achieved      PT SHORT TERM GOAL #4   Title Pt will be able to ambulate at least 20' using RW with min/mod SOB and recovery within 1' of sitting    Status Achieved             PT Long Term Goals - 01/02/21 1110      PT LONG TERM GOAL #1   Title Pt will improve functional strength to allow for ind/supervision only with bed mobility and chair transfers    Baseline SBA to min A    Status On-going      PT LONG TERM GOAL #2   Title Pt will be able to demo coordination of step ups and downs with caregiver or PT min assist to simulate entry stairs access (4 stairs no railing) for improved safety.    Baseline always use 2 people at home for the 4 steps, can do the single step before landing with 1 min assist, up  is harder    Status On-going      PT LONG TERM GOAL #3   Title Pt will demo improved awareness of more erect  posture with less need for cueing during ther ex for spine posture.    Baseline Pt able to achieve Lt knee full ext and neutral hips when standing with walker 01/02/21, does not place full weight through Rt LE due to knee pain    Status Achieved      PT LONG TERM GOAL #4   Title Pt will be able to ambulate at least 40' with RW and brief standing or sitting break as needed for SOB secondary to heart condition to demo improved gait endurance for household amb (with supervision)    Baseline 33', then 20' - needed signif seated break after 33'    Status On-going      PT LONG TERM GOAL #5   Title Pt and Pt's daughter will learn strategies and demo improved efficiency of bed mobility including turning over in bed and transferring from bed to standing without rail (hospital bed rail interferes with getting up so can't use it).    Time 8    Period Weeks    Status New    Target Date 02/27/21                 Plan - 01/02/21 1202    Clinical Impression Statement Pt is making progress in PT and has met all STGs.  She is making progress toward all LTGs and has demonstrated improved participation in PT for endurance, transfers, gait, strength and functional mobility.  She has increased her time on the NuStep to 8' and ambulated with RW and supervision today x 45' and then 20'.  She is limited by fatigue and Rt leg/knee pain with ambulation but does demo improved standing posture with knee extension and upright trunk.  She transfers from chair with supervision and UE assist.  Her daughter reports they still struggle with bed mobility for Pt ind for rolling side to side in bed and with bed to stand transfers so Pt made new LTG for this.  They are using mod assist x 2 for 4-flight stairs at home to get in/out of home.  Pt needs manual assistance for Rt LE due to hip flexor weakness.  She is unable to perform seated march through [place].  Pt's daughter reports that they sometimes push her rollator with her  seated on rollator seat but with chair being so low Pt has trouble clearing feet due to LE weakness Rt>Lt.  PT gave her instructions to start working on [place] hold throughout the day and also instructed they could use a strap to help with LE clearance if this doesn't bother her UEs.  Pt will continue to benefit from Pt/caregiver education for home care and mobility strategies, endurance and functional strength with a taper to 1x/week for 8 more weeks.    Comorbidities a-fib, Rt THR with femur fracture revision    Examination-Activity Limitations Bathing;Transfers;Locomotion Level;Bed Mobility;Bend;Sit;Stairs;Stand;Toileting;Hygiene/Grooming;Dressing    Examination-Participation Restrictions Community Activity;Other    Stability/Clinical Decision Making Stable/Uncomplicated    Clinical Decision Making Low    Rehab Potential Good    PT Frequency 1x / week    PT Duration 8 weeks    PT Treatment/Interventions ADLs/Self Care Home Management;Moist Heat;Gait training;Stair training;Functional mobility training;Therapeutic activities;Therapeutic exercise;Neuromuscular re-education;Manual techniques;Patient/family education;Passive range of motion;Balance training    PT Next Visit Plan continue PT 1x/week,  work on new goal of bed rolling side to side and caregiver assisted transfer from hospital bed (no rail) to stand, gait endurance, standing endurance, UE/LE strength    PT Home Exercise Plan Access Code: [email or web address]    Consulted and Agree with Plan of Care Patient;Family member/caregiver    Family Member Consulted Pt's daughter           Patient will benefit from skilled therapeutic intervention in order to improve the following deficits and impairments:  Abnormal gait,Decreased range of motion,Difficulty walking,Cardiopulmonary status limiting activity,Postural dysfunction,Decreased strength,Decreased mobility,Decreased balance,Improper body mechanics,Pain,Decreased activity tolerance  Visit  Diagnosis: Abnormal posture - Plan: PT plan of care cert/re-cert  Other abnormalities of gait and mobility - Plan: PT plan of care cert/re-cert  Muscle weakness (generalized) - Plan: PT plan of care cert/re-cert     Problem List Patient Active Problem List   Diagnosis Date Noted  . Senile purpura ([place]) 12/09/2020  . Frail elderly 12/09/2020  . Unstable gait 10/25/2020  . Paroxysmal atrial fibrillation ([place]) 10/25/2020  . Nonrheumatic mitral valve stenosis 10/25/2020  . Nonrheumatic aortic valve stenosis 10/25/2020  . Fatigue 10/25/2020  . Generalized abdominal pain 10/25/2020  . Iron deficiency anemia secondary to inadequate dietary iron intake 10/25/2020  . Hypotension 10/25/2020  . Chronic combined systolic and diastolic heart failure ([place]) 10/25/2020  . Chronic bilateral low back pain without sciatica 10/25/2020  . Multiple falls 10/25/2020  . Slow transit constipation 10/25/2020  . Poor appetite 10/25/2020  . Underweight due to inadequate caloric intake 10/25/2020    Galdino Hinchman, PT 01/02/21 12:14 PM   [facility] [address] [place], [place], [address] Phone: [phone]   Fax:  [phone]  Name: Jaclyn Berry MRN: [id number] Date of Birth: 1927/06/13

## 2021-01-11 ENCOUNTER — Encounter: Payer: Self-pay | Admitting: Physical Therapy

## 2021-01-11 ENCOUNTER — Ambulatory Visit: Payer: Medicare Other | Attending: Orthopedic Surgery | Admitting: Physical Therapy

## 2021-01-11 ENCOUNTER — Other Ambulatory Visit: Payer: Self-pay

## 2021-01-11 DIAGNOSIS — M6281 Muscle weakness (generalized): Secondary | ICD-10-CM | POA: Diagnosis present

## 2021-01-11 DIAGNOSIS — R2689 Other abnormalities of gait and mobility: Secondary | ICD-10-CM | POA: Insufficient documentation

## 2021-01-11 DIAGNOSIS — R293 Abnormal posture: Secondary | ICD-10-CM | POA: Diagnosis not present

## 2021-01-11 NOTE — Patient Instructions (Signed)
Strategies for bed mobility and positioning:  Try using bedding as a roll for body support that you might be able to manipuate in the bed on your own to change positions. Try smaller changes to adjust how you are weight bearing through your hips without a full change from one side to other side. With caregiver, have caregiver give you a "hug" to help you sit up vs pull on your arm. Log your fluids to see what your intake patterns are.  Most of your fluids for hydration should be during day time with a goal to taper your fluids and eliminate fluid intake 1.5-2 hours before bed.  Sip water not carbonated beverages at night to combat dry mouth.

## 2021-01-11 NOTE — Therapy (Signed)
[facility] [address], [address] [place], [place], [address] Phone: [phone]   Fax:  [phone]  Physical Therapy Treatment  Patient Details  Name: Jaclyn Berry MRN: [id number] Date of Birth: November 06, 1926 Referring Provider (PT): [name], NP   Encounter Date: 01/11/2021   PT End of Session - 01/11/21 1239    Visit Number 14    Date for PT Re-Evaluation 02/27/21    Authorization Type Medicare Part A and B, KX at 15 visits    Progress Note Due on Visit 20    PT Start Time 1147    PT Stop Time 1232    PT Time Calculation (min) 45 min    Activity Tolerance No increased pain;Patient limited by fatigue    Behavior During Therapy [facility] for tasks assessed/performed           Past Medical History:  Diagnosis Date  . A-fib (HCC)   . Anemia   . Aortic valve stenosis   . Arthritis   . Depression   . Gall stones   . Gallstone   . Low BP   . Mitral valve stenosis     Past Surgical History:  Procedure Laterality Date  . [place]OSCOPY     [place]. Normal  . ERCP  09/2019   [facility]  . FRACTURE SURGERY  2016   Broken Arm  . HIP SURGERY  2009   Replacement repaired    . MELANOMA EXCISION  2013   right leg  . mirtral valve repair  2008  . ROTATOR CUFF REPAIR  2016  . TOTAL HIP ARTHROPLASTY  2008  . VAGINAL HYSTERECTOMY  2005    There were no vitals filed for this visit.   Subjective Assessment - 01/11/21 1150    Pertinent History PMH: h/o fall, Rt hip replacement with revision after a fall (2008/2009) with ongoing pain since femur fraction/revision, a-fib, reduced ejection fraction                             [facility] Adult PT Treatment/Exercise - 01/11/21 0001      Self-Care   Self-Care Other Self-Care Comments    Other Self-Care Comments  strategies for positioning at night for improved independence with bed mobility, caregiver training for SL to sit transfer, fluid  bladder log, timing and type of fluids in evening to reduce trips to bathroom at night      Therapeutic Activites    Therapeutic Activities Other Therapeutic Activities    Other Therapeutic Activities bed mobility rolling Lt and Rt without UE assist and with UE assist to simulate bed rail use, SL to sit, sit to SL                  PT Education - 01/11/21 1235    Education Details strategies for bed mobility, positioning, reducing frequency of bathroom trips at night via fluid management timing    Person(s) Educated Patient;Child(ren)    Methods Explanation;Demonstration;Handout;Tactile cues;Verbal cues    Comprehension Verbalized understanding;Returned demonstration            PT Short Term Goals - 01/02/21 1110      PT SHORT TERM GOAL #1   Title Pt will be able to perform intial HEP with supervision for safety    Status Achieved      PT SHORT TERM GOAL #2   Title Pt will be able to demo bed rolling  Lt/Rt with supervision using proper form and muscle groups    Status Achieved      PT SHORT TERM GOAL #3   Title Pt will report at least 30% greater ease and confidence with bed mobility and transfers with supervision/min assist as needed    Status Achieved      PT SHORT TERM GOAL #4   Title Pt will be able to ambulate at least 20' using RW with min/mod SOB and recovery within 1' of sitting    Status Achieved             PT Long Term Goals - 01/11/21 1240      PT LONG TERM GOAL #5   Title Pt and Pt's daughter will learn strategies and demo improved efficiency of bed mobility including turning over in bed and transferring from bed to standing without rail (hospital bed rail interferes with getting up so can't use it).    Status On-going                 Plan - 01/11/21 1240    Clinical Impression Statement Pt and Pt's daughter wished to work on new goal of evening routine with bed mobility, transfers and positioning for comfort.  She currently needs max  assist x 1 1-3 times nightly within her hospital bed for repositioning and trips to bathroom.  PT, Pt and Pt's daughter worked together to discuss strategies and practiced rolling side to side and SL to sit/sit to SL transfers.  Pt is able to roll side to side ind but needs assistance at night due to being supported by pillows behind her back that she can't reposition on her own.  PT gave strategies of micro adjustments within sidelying to offweight hip bone, using bed rolls that Pt may be able to manipulate more ind, and regulating her type and timing of fluids to taper/eliminate 1-2 hours before bedtime and avoid carbonation at night to see if this reduces need for bathroom trips with bed repositioning.  Caregiver was pulling on Pt's arm for SL to sit transfer so PT had her practice safe body mechanics while holding Pt's trunk in a hug to help her sit up.  Both Pt and caregive demo'd this and felt more comfortable with this.  Pt will try suggestions and PT will keep strategizing with them depending on how these go.    Comorbidities a-fib, Rt THR with femur fracture revision    PT Next Visit Plan continue PT 1x/week, work on new goal of bed rolling side to side and caregiver assisted transfer from hospital bed (no rail) to stand, gait endurance, standing endurance, UE/LE strength    PT Home Exercise Plan Access Code: [email or web address]    Consulted and Agree with Plan of Care Patient;Family member/caregiver    Family Member Consulted Pt's daughter           Patient will benefit from skilled therapeutic intervention in order to improve the following deficits and impairments:     Visit Diagnosis: Abnormal posture  Other abnormalities of gait and mobility  Muscle weakness (generalized)     Problem List Patient Active Problem List   Diagnosis Date Noted  . Senile purpura (HCC) 12/09/2020  . Frail elderly 12/09/2020  . Unstable gait 10/25/2020  . Paroxysmal atrial fibrillation (HCC) 10/25/2020  .  Nonrheumatic mitral valve stenosis 10/25/2020  . Nonrheumatic aortic valve stenosis 10/25/2020  . Fatigue 10/25/2020  . Generalized abdominal pain 10/25/2020  . Iron deficiency anemia secondary to  inadequate dietary iron intake 10/25/2020  . Hypotension 10/25/2020  . Chronic combined systolic and diastolic heart failure (HCC) 10/25/2020  . Chronic bilateral low back pain without sciatica 10/25/2020  . Multiple falls 10/25/2020  . Slow transit constipation 10/25/2020  . Poor appetite 10/25/2020  . Underweight due to inadequate caloric intake 10/25/2020    Morton Peters, PT 01/11/21 12:46 PM   [facility] [address], [address] [place], [place], [address] Phone: [phone]   Fax:  [phone]  Name: Jaclyn Berry MRN: [id number] Date of Birth: January 19, 1927

## 2021-01-13 ENCOUNTER — Telehealth: Payer: Self-pay | Admitting: *Deleted

## 2021-01-13 NOTE — Telephone Encounter (Signed)
Daughter notified and agreed and scheduled an appointment for Wednesday with Dr. Hyacinth Meeker.

## 2021-01-13 NOTE — Telephone Encounter (Signed)
Jaclyn Berry, daughter called and stated that patient is on blood thinners and here lately she has been bruising easily. Stated that her arms and legs itch and when she scratches she bleeds easily. Daughter is wondering if she should be concerned. Wants to know if she needs to bring her in for an appointment.   Please Advise. (forwarded to Shanda Bumps due to Amy out of office)

## 2021-01-13 NOTE — Telephone Encounter (Signed)
If they are concerned would recommend evaluation in office. This is hard to determine without evaluation.

## 2021-01-14 ENCOUNTER — Encounter (HOSPITAL_COMMUNITY): Payer: Self-pay | Admitting: Emergency Medicine

## 2021-01-14 ENCOUNTER — Emergency Department (HOSPITAL_COMMUNITY)
Admission: EM | Admit: 2021-01-14 | Discharge: 2021-01-14 | Disposition: A | Discharge status: Home / Self Care | Payer: Medicare Other | Attending: Emergency Medicine | Admitting: Emergency Medicine

## 2021-01-14 ENCOUNTER — Other Ambulatory Visit: Payer: Self-pay

## 2021-01-14 ENCOUNTER — Telehealth: Payer: Self-pay | Admitting: Adult Health

## 2021-01-14 DIAGNOSIS — D649 Anemia, unspecified: Secondary | ICD-10-CM | POA: Diagnosis not present

## 2021-01-14 DIAGNOSIS — Z7982 Long term (current) use of aspirin: Secondary | ICD-10-CM | POA: Diagnosis not present

## 2021-01-14 DIAGNOSIS — X58XXXA Exposure to other specified factors, initial encounter: Secondary | ICD-10-CM | POA: Insufficient documentation

## 2021-01-14 DIAGNOSIS — S0083XA Contusion of other part of head, initial encounter: Secondary | ICD-10-CM | POA: Insufficient documentation

## 2021-01-14 DIAGNOSIS — Z96659 Presence of unspecified artificial knee joint: Secondary | ICD-10-CM | POA: Diagnosis not present

## 2021-01-14 DIAGNOSIS — Z79899 Other long term (current) drug therapy: Secondary | ICD-10-CM | POA: Diagnosis not present

## 2021-01-14 DIAGNOSIS — S8992XA Unspecified injury of left lower leg, initial encounter: Secondary | ICD-10-CM | POA: Diagnosis present

## 2021-01-14 DIAGNOSIS — I5042 Chronic combined systolic (congestive) and diastolic (congestive) heart failure: Secondary | ICD-10-CM | POA: Insufficient documentation

## 2021-01-14 DIAGNOSIS — S8012XA Contusion of left lower leg, initial encounter: Secondary | ICD-10-CM | POA: Diagnosis not present

## 2021-01-14 LAB — CBC WITH DIFFERENTIAL/PLATELET
Abs Immature Granulocytes: 0.06 10*3/uL (ref 0.00–0.07)
Basophils Absolute: 0 10*3/uL (ref 0.0–0.1)
Basophils Relative: 0 %
Eosinophils Absolute: 0 10*3/uL (ref 0.0–0.5)
Eosinophils Relative: 0 %
HCT: 26.4 % — ABNORMAL LOW (ref 36.0–46.0)
Hemoglobin: 8.5 g/dL — ABNORMAL LOW (ref 12.0–15.0)
Immature Granulocytes: 1 %
Lymphocytes Relative: 18 %
Lymphs Abs: 1.1 10*3/uL (ref 0.7–4.0)
MCH: 34.7 pg — ABNORMAL HIGH (ref 26.0–34.0)
MCHC: 32.2 g/dL (ref 30.0–36.0)
MCV: 107.8 fL — ABNORMAL HIGH (ref 80.0–100.0)
Monocytes Absolute: 0.4 10*3/uL (ref 0.1–1.0)
Monocytes Relative: 6 %
Neutro Abs: 4.5 10*3/uL (ref 1.7–7.7)
Neutrophils Relative %: 75 %
Platelets: 191 10*3/uL (ref 150–400)
RBC: 2.45 MIL/uL — ABNORMAL LOW (ref 3.87–5.11)
RDW: 14.2 % (ref 11.5–15.5)
WBC: 6 10*3/uL (ref 4.0–10.5)
nRBC: 0 % (ref 0.0–0.2)

## 2021-01-14 LAB — PROTIME-INR
INR: 1 (ref 0.8–1.2)
Prothrombin Time: 13.1 seconds (ref 11.4–15.2)

## 2021-01-14 LAB — HEPATIC FUNCTION PANEL
ALT: 15 U/L (ref 0–44)
AST: 35 U/L (ref 15–41)
Albumin: 3.2 g/dL — ABNORMAL LOW (ref 3.5–5.0)
Alkaline Phosphatase: 55 U/L (ref 38–126)
Bilirubin, Direct: 0.1 mg/dL (ref 0.0–0.2)
Indirect Bilirubin: 0.6 mg/dL (ref 0.3–0.9)
Total Bilirubin: 0.7 mg/dL (ref 0.3–1.2)
Total Protein: 7.2 g/dL (ref 6.5–8.1)

## 2021-01-14 LAB — BASIC METABOLIC PANEL
Anion gap: 8 (ref 5–15)
BUN: 31 mg/dL — ABNORMAL HIGH (ref 8–23)
CO2: 30 mmol/L (ref 22–32)
Calcium: 9.1 mg/dL (ref 8.9–10.3)
Chloride: 96 mmol/L — ABNORMAL LOW (ref 98–111)
Creatinine, Ser: 1.03 mg/dL — ABNORMAL HIGH (ref 0.44–1.00)
GFR, Estimated: 51 mL/min — ABNORMAL LOW (ref >60)
Glucose, Bld: 103 mg/dL — ABNORMAL HIGH (ref 70–99)
Potassium: 3.7 mmol/L (ref 3.5–5.1)
Sodium: 134 mmol/L — ABNORMAL LOW (ref 135–145)

## 2021-01-14 LAB — TYPE AND SCREEN
ABO/RH(D): A POS
Antibody Screen: NEGATIVE

## 2021-01-14 NOTE — Telephone Encounter (Signed)
Daughter called on-call provider and stated that patient has a hematoma above her ankle that is bleeding and has severe burning sensation. Patient has difficulty walking. Advised to bring to ED.

## 2021-01-14 NOTE — ED Notes (Signed)
Patient ambulated well with moderate balance assistance only

## 2021-01-14 NOTE — ED Notes (Signed)
Patient verbalizes understanding of discharge instructions. Opportunity for questioning and answers were provided. Armband removed by staff, pt discharged from ED via wheelchair with family member.  

## 2021-01-14 NOTE — ED Triage Notes (Addendum)
Pt reports L leg burning this afternoon.  Family noticed large hematoma with bleeding to L medial ankle.  No known injury.  Pt denies fall.  L foot swollen.

## 2021-01-14 NOTE — ED Provider Notes (Signed)
Emergency Medicine Provider Triage Evaluation Note  Jaclyn Berry , a 87 y.o. female  was evaluated in triage.  Pt complains of left leg pain x 1 day, unable to stand because of severe sharp pain.  He reports that while she was at work her husband called and noted that patient had complained of leg pain and had a large hematoma on left that started bleeding.  He had difficulty controlling the bleeding and she soaked through 3 washcloths. On daily aspirin and digoxin per daughter. Patient denies any recent fall or leg injury.  Daughter has noticed increased bruising all of the patient's body as well.   Review of Systems  Positive: Generalized weakness, leg pain, left foot swelling Negative: Chest pain  Physical Exam  BP 137/63 (BP Location: Right Arm)   Pulse 63   Temp (!) 97.5 F (36.4 C)   Resp 13   SpO2 99%  Gen:   Awake, no distress   Resp:  Normal effort  MSK:   Moves extremities without difficulty  Other:  Circular wound and hematoma on medial aspect of left lower extremity.  No active bleeding.  Swelling to left foot.  Bilateral feet are cool to the touch.  Medical Decision Making  Medically screening exam initiated at 6:25 PM.  Appropriate orders placed.  [name] was informed that the remainder of the evaluation will be completed by another provider, this initial triage assessment does not replace that evaluation, and the importance of remaining in the ED until their evaluation is complete.  Using pencil Doppler able to get DP pulses.  They were marked on bilateral feet.  Basic labs ordered as well as type and screen and digoxin level.   Portions of this note were generated with Scientist, clinical (histocompatibility and immunogenetics). Dictation errors may occur despite best attempts at proofreading.    [name], PA-C 01/14/21 1837    Wynetta Fines, MD 01/15/21 1946

## 2021-01-14 NOTE — ED Provider Notes (Signed)
Jaclyn Berry   CSN: [id number] Arrival date & time: 01/14/21  1709     History No chief complaint on file.   Jaclyn Berry is a 87 y.o. female.  The history is provided by the patient, a relative and medical records.   Jaclyn Berry is a 87 y.o. female who presents to the Emergency Department complaining of leg bleeding. She presents the emergency department accompanied by her daughter for evaluation of bleeding and pain to the left leg. She was at home with her son when he attempted to get her up to assist with walking and when she complained of sudden onset of severe pain to her left leg. He put her back into bed and noticed that there was a large hematoma to the area that started bleeding spontaneously. There was significant blood loss at home. Her pain did partially improved after the area started bleeding. She does have increased bleeding and bruising throughout her body with minimal trauma. She takes aspirin and digoxin for history of a fib. No reports of injuries.    Past Medical History:  Diagnosis Date  . A-fib (HCC)   . Anemia   . Aortic valve stenosis   . Arthritis   . Depression   . Gall stones   . Gallstone   . Low BP   . Mitral valve stenosis     Patient Active Problem List   Diagnosis Date Noted  . Senile purpura (HCC) 12/09/2020  . Frail elderly 12/09/2020  . Unstable gait 10/25/2020  . Paroxysmal atrial fibrillation (HCC) 10/25/2020  . Nonrheumatic mitral valve stenosis 10/25/2020  . Nonrheumatic aortic valve stenosis 10/25/2020  . Fatigue 10/25/2020  . Generalized abdominal pain 10/25/2020  . Iron deficiency anemia secondary to inadequate dietary iron intake 10/25/2020  . Hypotension 10/25/2020  . Chronic combined systolic and diastolic heart failure (HCC) 10/25/2020  . Chronic bilateral low back pain without sciatica 10/25/2020  . Multiple falls 10/25/2020  . Slow transit constipation 10/25/2020  .  Poor appetite 10/25/2020  . Underweight due to inadequate caloric intake 10/25/2020    Past Surgical History:  Procedure Laterality Date  . [place]OSCOPY     [place]. Normal  . [facility]CP  09/2019   [facility]  . FRACTURE SURGERY  2016   Broken Arm  . HIP SURGERY  2009   Replacement repaired    . MELANOMA EXCISION  2013   right leg  . mirtral valve repair  2008  . ROTATOR CUFF REPAIR  2016  . TOTAL HIP ARTHROPLASTY  2008  . VAGINAL HYSTERECTOMY  2005     OB History   No obstetric history on file.     Family History  Problem Relation Age of Onset  . COPD Mother   . Emphysema Mother   . Stroke Father        With complications  . Colon cancer Neg Hx   . Esophageal cancer Neg Hx   . Colon polyps Neg Hx     Social History   Tobacco Use  . Smoking status: Never Smoker  . Smokeless tobacco: Never Used  Vaping Use  . Vaping Use: Never used  Substance Use Topics  . Alcohol use: Never  . Drug use: Never    Home Medications Prior to Admission medications   Medication Sig Start Date End Date Taking? Authorizing Provider  acetaminophen (TYLENOL) 650 MG CR tablet Take 650 mg by mouth every 12 (twelve) hours as needed for  pain.   Yes [provider]  amitriptyline (ELAVIL) 50 MG tablet Take 25 mg by mouth at bedtime.   Yes [provider]  aspirin EC 81 MG tablet Take 81 mg by mouth daily. Swallow whole.   Yes [provider]  bumetanide (BUMEX) 1 MG tablet Take 1 mg by mouth daily.   Yes [provider]  digoxin (LANOXIN) 0.125 MG tablet Take 1 tablet (0.125 mg total) by mouth daily. 12/26/20  Yes Fargo, Amy E, NP  docusate sodium (COLACE) 100 MG capsule Take 1 capsule (100 mg total) by mouth 2 (two) times daily. Patient taking differently: Take 100 mg by mouth See admin instructions. Take 100 mg by mouth at bedtime every other night 12/08/20  Yes Fargo, Amy E, NP  ferrous sulfate 325 (65 FE) MG tablet Take 325 mg by mouth  2 (two) times daily with a meal.   Yes [provider]  metoCLOPramide (REGLAN) 5 MG tablet Take 1 tablet (5 mg total) by mouth 2 (two) times daily. 12/02/20  Yes Fargo, Amy E, NP  mirtazapine (REMERON) 30 MG tablet Take 1 tablet (30 mg total) by mouth at bedtime. 12/26/20  Yes Fargo, Amy E, NP  Multiple Vitamins-Minerals (PRESERVISION AREDS 2+MULTI VIT PO) Take 1 capsule by mouth at bedtime.   Yes [provider]  ondansetron (ZOFRAN) 4 MG tablet Take 4 mg by mouth 3 (three) times daily as needed for nausea or vomiting.   Yes [provider]  pantoprazole (PROTONIX) 40 MG tablet Take 1 tablet (40 mg total) by mouth 2 (two) times daily. 10/21/20  Yes Fargo, Amy E, NP  potassium chloride SA (KLOR-CON) 20 MEQ tablet Take 1 tablet (20 mEq total) by mouth daily. 12/16/20  Yes Fargo, Amy E, NP  sennosides-docusate sodium (SENOKOT-S) 8.6-50 MG tablet Take 1 tablet by mouth every other day.   Yes [provider]  sildenafil (REVATIO) 20 MG tablet Take 1 tablet (20 mg total) by mouth 2 (two) times daily. 11/11/20  Yes Fargo, Amy E, NP  traMADol (ULTRAM) 50 MG tablet Take 50 mg by mouth every 12 (twelve) hours as needed (for pain).   Yes [provider]  vitamin B-12 (CYANOCOBALAMIN) 1000 MCG tablet Take 1,000 mcg by mouth daily.   Yes [provider]    Allergies    Tape, Morphine and related, and Sulfa antibiotics  Review of Systems   Review of Systems  All other systems reviewed and are negative.   Physical Exam Updated Vital Signs BP 106/65 (BP Location: Right Arm)   Pulse 77   Temp (!) 97.5 F (36.4 C)   Resp 19   SpO2 100%   Physical Exam Vitals and nursing Berry reviewed.  Constitutional:      Appearance: She is well-developed.     Comments: Frail, cachectic  HENT:     Head: Normocephalic.     Comments: Ecchymosis to the right temple Cardiovascular:     Rate and Rhythm: Normal rate and regular rhythm.     Heart sounds: No murmur  heard.   Pulmonary:     Effort: Pulmonary effort is normal. No respiratory distress.     Breath sounds: Normal breath sounds.  Abdominal:     Palpations: Abdomen is soft.     Tenderness: There is no abdominal tenderness. There is no guarding or rebound.  Musculoskeletal:        General: No tenderness.     Comments: 2+ DP pulses bilaterally. The medial  aspect of the left distal a large hematoma. The superior aspect of the hematoma has a small opening with evidence of recent bleeding. There is one plus edema to the left foot and ankle. There are probable posterior tibial pulses bilaterally.  Skin:    General: Skin is warm and dry.     Comments: Scattered areas of ecchymosis throughout the face, extremities.  Neurological:     Mental Status: She is alert and oriented to person, place, and time.  Psychiatric:        Behavior: Behavior normal.     ED Results / Procedures / Treatments   Labs (all labs ordered are listed, but only abnormal results are displayed) Labs Reviewed  CBC WITH DIFFERENTIAL/PLATELET - Abnormal; Notable for the following components:      Result Value   RBC 2.45 (*)    Hemoglobin 8.5 (*)    HCT 26.4 (*)    MCV 107.8 (*)    MCH 34.7 (*)    All other components within normal limits  BASIC METABOLIC PANEL - Abnormal; Notable for the following components:   Sodium 134 (*)    Chloride 96 (*)    Glucose, Bld 103 (*)    BUN 31 (*)    Creatinine, Ser 1.03 (*)    GFR, Estimated 51 (*)    All other components within normal limits  HEPATIC FUNCTION PANEL - Abnormal; Notable for the following components:   Albumin 3.2 (*)    All other components within normal limits  PROTIME-INR  DIGOXIN LEVEL  TYPE AND SCREEN  ABO/RH    EKG None  Radiology No results found.  Procedures Procedures   Medications Ordered in ED Medications - No data to display  ED Course  I have reviewed the triage vital signs and the nursing notes.  Pertinent labs & imaging results  that were available during my care of the patient were reviewed by me and considered in my medical decision making (see chart for details).    MDM Rules/Calculators/A&P                         patient here for evaluation of bleeding to the left leg. On examination she has a hematoma to the left distal leg. There is stigmata of recent bleeding but no active bleeding. She does have some soft tissue swelling distally but no evidence of acute infection. She has strong distal pulses. Presentation is not consistent with DVT, aneurysm. CBC with anemia, hemoglobin down trending when compared to March of this year. No evidence of coagulopathy on labs. Feel that current anemia is likely multifactorial and includes recent blood loss as well as nutritional issue versus mildly dysplasia. Given her down trending hemoglobin and easy bruising feel that she should consider discontinuing her aspirin. This can be discussed with her cardiologist. Feels she is stable for discharge home with local wound care at this time with close PCP follow-up for further evaluation as well as return precautions.  Final Clinical Impression(s) / ED Diagnoses Final diagnoses:  Hematoma of left lower extremity, initial encounter  Anemia, unspecified type    Rx / DC Orders ED Discharge Orders    None       Tilden Fossa, MD 01/14/21 2205

## 2021-01-14 NOTE — Discharge Instructions (Signed)
You are anemic today with a hemoglobin of 8.5. Please follow-up with your family doctor in the next few days for recheck of your labs. You will need more labs performed to get a better idea of why you are anemic. If you have bleeding to your leg again please apply direct pressure for several minutes. If you are unable to control the bleeding get rechecked in the emergency department.

## 2021-01-16 ENCOUNTER — Other Ambulatory Visit: Payer: Self-pay

## 2021-01-16 ENCOUNTER — Ambulatory Visit (INDEPENDENT_AMBULATORY_CARE_PROVIDER_SITE_OTHER): Payer: Medicare Other | Admitting: Adult Health

## 2021-01-16 ENCOUNTER — Encounter: Payer: Self-pay | Admitting: Adult Health

## 2021-01-16 VITALS — BP 90/70 | HR 70 | Temp 97.5°F | Resp 16 | Ht 63.0 in | Wt 75.8 lb

## 2021-01-16 DIAGNOSIS — D508 Other iron deficiency anemias: Secondary | ICD-10-CM

## 2021-01-16 DIAGNOSIS — T148XXA Other injury of unspecified body region, initial encounter: Secondary | ICD-10-CM

## 2021-01-16 MED ORDER — VITAMIN C 500 MG PO TABS
500.0000 mg | ORAL_TABLET | Freq: Two times a day (BID) | ORAL | 0 refills | Status: AC
Start: 2021-01-16 — End: ?

## 2021-01-16 NOTE — Progress Notes (Signed)
[facility]  Provider:   Kenard Gower - DNP  Code Status:  Full Code  Goals of Care:  Advanced Directives 01/16/2021  Does Patient Have a Medical Advance Directive? Yes  Type of Advance Directive Healthcare Power of Attorney  Does patient want to make changes to medical advance directive? No - Patient declined  Copy of Healthcare Power of Attorney in Chart? Yes - validated most recent copy scanned in chart (See row information)     Chief Complaint  Patient presents with   Hospitalization Follow-up    Follow up from recent ER visit. Hematoma on leg is bleeding more.    HPI: Patient is a 87 y.o. female seen today for an acute visit for a follow up post ER visit on 01/14/21 for bleeding and pain on her left leg. She was at home with her son when he attempted to get her up to assist with walking when she complained of sudden onset of severe pain to her left leg. He put her back into her bed and noticed that there was a large hematoma on her left leg which has started bleeding spontaneously. There was a significant blood loss at home. She stated that her pain on her left leg has partially improved after the area started  bleeding. She takes ASA and Digoxin for history of Atrial fibrillation. She has bruising throughout her body with minimal trauma.   She came to [facility] with daughter. Noted hematoma  on her left leg with no active bleeding. She was noted to have bruising on her face arms and legs.     Past Medical History:  Diagnosis Date   A-fib (HCC)    Anemia    Aortic valve stenosis    Arthritis    Depression    Gall stones    Gallstone    Low BP    Mitral valve stenosis     Past Surgical History:  Procedure Laterality Date   COLONOSCOPY     [place]. Normal   [facility]   FRACTURE SURGERY  2016   Broken Arm   HIP SURGERY  2009   Replacement repaired     MELANOMA EXCISION  2013   right leg   mirtral valve repair  2008    ROTATOR CUFF REPAIR  2016   TOTAL HIP ARTHROPLASTY  2008   VAGINAL HYSTERECTOMY  2005    Allergies  Allergen Reactions   Tape Other (See Comments)    SKIN TEARS VERY EASILY!!!!   Morphine And Related Nausea And Vomiting   Sulfa Antibiotics Rash    Outpatient Encounter Medications as of 01/16/2021  Medication Sig   acetaminophen (TYLENOL) 650 MG CR tablet Take 650 mg by mouth every 12 (twelve) hours as needed for pain.   amitriptyline (ELAVIL) 50 MG tablet Take 25 mg by mouth at bedtime.   aspirin EC 81 MG tablet Take 81 mg by mouth daily. Swallow whole.   bumetanide (BUMEX) 1 MG tablet Take 1 mg by mouth daily.   digoxin (LANOXIN) 0.125 MG tablet Take 1 tablet (0.125 mg total) by mouth daily.   docusate sodium (COLACE) 100 MG capsule Take 100 mg by mouth as needed for mild constipation.   ferrous sulfate 325 (65 FE) MG tablet Take 325 mg by mouth 2 (two) times daily with a meal.   metoCLOPramide (REGLAN) 5 MG tablet Take 1 tablet (5 mg total) by mouth 2 (two) times daily.   mirtazapine (  REMERON) 30 MG tablet Take 1 tablet (30 mg total) by mouth at bedtime.   Multiple Vitamins-Minerals (PRESERVISION AREDS 2+MULTI VIT PO) Take 1 capsule by mouth at bedtime.   ondansetron (ZOFRAN) 4 MG tablet Take 4 mg by mouth 3 (three) times daily as needed for nausea or vomiting.   pantoprazole (PROTONIX) 40 MG tablet Take 1 tablet (40 mg total) by mouth 2 (two) times daily.   potassium chloride SA (KLOR-CON) 20 MEQ tablet Take 1 tablet (20 mEq total) by mouth daily.   sennosides-docusate sodium (SENOKOT-S) 8.6-50 MG tablet Take 1 tablet by mouth every other day.   sildenafil (REVATIO) 20 MG tablet Take 1 tablet (20 mg total) by mouth 2 (two) times daily.   traMADol (ULTRAM) 50 MG tablet Take 50 mg by mouth every 12 (twelve) hours as needed (for pain).   vitamin B-12 (CYANOCOBALAMIN) 1000 MCG tablet Take 1,000 mcg by mouth daily.   [DISCONTINUED] docusate sodium (COLACE) 100 MG capsule Take 1 capsule  (100 mg total) by mouth 2 (two) times daily. (Patient taking differently: Take 100 mg by mouth See admin instructions. Take 100 mg by mouth at bedtime every other night)   No facility-administered encounter medications on file as of 01/16/2021.    Review of Systems:  Review of Systems  Constitutional:  Negative for activity change, appetite change and fever.  HENT:  Negative for congestion.   Respiratory:  Negative for cough and shortness of breath.   Cardiovascular:  Negative for leg swelling.  Gastrointestinal:  Negative for abdominal pain, blood in stool and constipation.  Genitourinary:  Negative for difficulty urinating and hematuria.  Musculoskeletal:  Negative for back pain.  Skin:        Easily bruises and bleeds  Neurological:  Negative for dizziness and headaches.  Hematological:  Bruises/bleeds easily.  Psychiatric/Behavioral: Negative.     Health Maintenance  Topic Date Due   TETANUS/TDAP  Never done   Zoster Vaccines- Shingrix (1 of 2) Never done   DEXA SCAN  Never done   PNA vac Low Risk Adult (1 of 2 - PCV13) Never done   INFLUENZA VACCINE  03/13/2021   COVID-19 Vaccine  Completed   Pneumococcal Vaccine 85-34 Years old  Aged Out   HPV VACCINES  Aged Out    Physical Exam: Vitals:   01/16/21 1435  BP: 90/70  Pulse: 70  Resp: 16  Temp: (!) 97.5 F (36.4 C)  SpO2: 93%  Weight: 75 lb 12.8 oz (34.4 kg)  Height: 5\' 3"  (1.6 m)   Body mass index is 13.43 kg/m. Physical Exam Constitutional:      Comments: Cachectic  Eyes:     General: No scleral icterus.    Conjunctiva/sclera: Conjunctivae normal.  Cardiovascular:     Rate and Rhythm: Normal rate and regular rhythm.  Pulmonary:     Effort: Pulmonary effort is normal.     Breath sounds: Normal breath sounds.  Abdominal:     General: Abdomen is flat. Bowel sounds are normal.     Palpations: Abdomen is soft.  Musculoskeletal:        General: Normal range of motion.     Cervical back: Normal range of motion  and neck supple.  Skin:    General: Skin is warm and dry.     Comments: Hematoma on her left lower leg.  Bruises on her face BLE and BUE  Neurological:     General: No focal deficit present.     Mental Status: She  is alert and oriented to person, place, and time.  Psychiatric:        Mood and Affect: Mood normal.        Behavior: Behavior normal.    Labs reviewed: Basic Metabolic Panel: Recent Labs    10/21/20 0000 11/04/20 1247 01/14/21 1832  NA 136 137 134*  K 4.0 4.4 3.7  CL 97* 93* 96*  CO2 26 24 30   GLUCOSE 164* 120* 103*  BUN 25 29 31*  CREATININE 1.02* 1.07* 1.03*  CALCIUM 8.6 8.9 9.1   Liver Function Tests: Recent Labs    01/14/21 1902  AST 35  ALT 15  ALKPHOS 55  BILITOT 0.7  PROT 7.2  ALBUMIN 3.2*    CBC: Recent Labs    10/21/20 0000 01/14/21 1832  WBC 6.5 6.0  NEUTROABS 5,005 4.5  HGB 10.9* 8.5*  HCT 32.2* 26.4*  MCV 92.5 107.8*  PLT 179 191   Lipid Panel: No results for input(s): CHOL, HDL, LDLCALC, TRIG, CHOLHDL, LDLDIRECT in the last 8760 hours. No results found for: HGBA1C  Procedures since last visit: No results found.  Assessment/Plan  1. Hematoma -   keep area clean and dry -   instructed and demonstrated how to clean left leg hematoma with saline, apply Xeroform dressing, cover with Telfa (nonstick dressing) and wrap with Cobain snugly from left foot to lower leg, making sure it is not  tight  2. Iron deficiency anemia secondary to inadequate dietary iron intake Lab Results  Component Value Date   HGB 8.5 (L) 01/14/2021   -  continue FeSO4 325 mg daily and to start taking Vitamin C for adequate absorption of Iron - vitamin C (ASCORBIC ACID) 500 MG tablet; Take 1 tablet (500 mg total) by mouth 2 (two) times daily.  Dispense: 60 tablet; Refill: 0  .  Labs/tests ordered:  None  Next appt:  01/18/2021

## 2021-01-16 NOTE — Patient Instructions (Signed)
Hematoma A hematoma is a collection of blood. A hematoma can happen:  Under the skin.  In an organ.  In a body space.  In a joint space.  In other tissues. The blood can thicken (clot) to form a lump that you can see and feel. The lump is often hard and may become sore and tender. The lump can be very small or very big. Most hematomas get better in a few days to weeks. However, some hematomas may be serious and need medical care. What are the causes? This condition is caused by:  An injury.  Blood that leaks under the skin.  Problems from surgeries.  Medical conditions that cause bleeding or bruising. What increases the risk? You are more likely to develop this condition if:  You are an older adult.  You use medicines that thin your blood. What are the signs or symptoms? Symptoms depend on where the hematoma is in your body.  If the hematoma is under the skin, there is: ? A firm lump on the body. ? Pain and tenderness in the area. ? Bruising. The skin above the lump may be blue, dark blue, purple-red, or yellowish.  If the hematoma is deep in the tissues or body spaces, there may be: ? Blood in the stomach. This may cause pain in the belly (abdomen), weakness, passing out (fainting), and shortness of breath. ? Blood in the head. This may cause a headache, weakness, trouble speaking or understanding speech, or passing out.   How is this diagnosed? This condition is diagnosed based on:  Your medical history.  A physical exam.  Imaging tests, such as ultrasound or CT scan.  Blood tests. How is this treated? Treatment depends on the cause, size, and location of the hematoma. Treatment may include:  Doing nothing. Many hematomas go away on their own without treatment.  Surgery or close monitoring. This may be needed for large hematomas or hematomas that affect the body's organs.  Medicines. These may be given if a medical condition caused the hematoma. Follow  these instructions at home: Managing pain, stiffness, and swelling  If told, put ice on the area. ? Put ice in a plastic bag. ? Place a towel between your skin and the bag. ? Leave the ice on for 20 minutes, 2-3 times a day for the first two days.  If told, put heat on the affected area after putting ice on the area for two days. Use the heat source that your doctor tells you to use. This could be a moist heat pack or a heating pad. To do this: ? Place a towel between your skin and the heat source. ? Leave the heat on for 20-30 minutes. ? Remove the heat if your skin turns bright red. This is very important if you are unable to feel pain, heat, or cold. You may have a greater risk of getting burned.  Raise (elevate) the affected area above the level of your heart while you are sitting or lying down.  Wrap the affected area with an elastic bandage, if told by your doctor. Do not wrap the bandage too tightly.  If your hematoma is on a leg or foot and is painful, your doctor may give you crutches. Use them as told by your doctor.   General instructions  Take over-the-counter and prescription medicines only as told by your doctor.  Keep all follow-up visits as told by your doctor. This is important. Contact a doctor if:  You   have a fever.  The swelling or bruising gets worse.  You start to get more hematomas. Get help right away if:  Your pain gets worse.  Your pain is not getting better with medicine.  Your skin over the hematoma breaks or starts to bleed.  Your hematoma is in your chest or belly and you: ? Pass out. ? Feel weak. ? Become short of breath.  You have a hematoma on your scalp that is caused by a fall or injury, and you: ? Have a headache that gets worse. ? Have trouble speaking or understanding speech. ? Become less alert or you pass out. Summary  A hematoma is a collection of blood in any part of your body.  Most hematomas get better on their own in a  few days to weeks. Some may need medical care.  Follow instructions from your doctor about how to care for your hematoma.  Contact a doctor if the swelling or bruising gets worse, or if you are short of breath. This information is not intended to replace advice given to you by your health care provider. Make sure you discuss any questions you have with your health care provider. Document Revised: 01/02/2018 Document Reviewed: 01/02/2018 Elsevier Patient Education  [place].  [place] medicine (25th ed., pp. [phone]). [place], [place]: Elsevier.">  Anemia  Anemia is a condition in which there is not enough red blood cells or hemoglobin in the blood. Hemoglobin is a substance in red blood cells that carries oxygen. When you do not have enough red blood cells or hemoglobin (are anemic), your body cannot get enough oxygen and your organs may not work properly. As a result, you may feel very tired or have other problems. What are the causes? Common causes of anemia include:  Excessive bleeding. Anemia can be caused by excessive bleeding inside or outside the body, including bleeding from the intestines or from heavy menstrual periods in females.  Poor nutrition.  Long-lasting (chronic) kidney, thyroid, and liver disease.  Bone marrow disorders, spleen problems, and blood disorders.  Cancer and treatments for cancer.  HIV (human immunodeficiency virus) and AIDS (acquired immunodeficiency syndrome).  Infections, medicines, and autoimmune disorders that destroy red blood cells. What are the signs or symptoms? Symptoms of this condition include:  Minor weakness.  Dizziness.  Headache, or difficulties concentrating and sleeping.  Heartbeats that feel irregular or faster than normal (palpitations).  Shortness of breath, especially with exercise.  Pale skin, lips, and nails, or cold hands and feet.  Indigestion and nausea. Symptoms may occur suddenly or develop  slowly. If your anemia is mild, you may not have symptoms. How is this diagnosed? This condition is diagnosed based on blood tests, your medical history, and a physical exam. In some cases, a test may be needed in which cells are removed from the soft tissue inside of a bone and looked at under a microscope (bone marrow biopsy). Your health care provider may also check your stool (feces) for blood and may do additional testing to look for the cause of your bleeding. Other tests may include:  Imaging tests, such as a CT scan or MRI.  A procedure to see inside your esophagus and stomach (endoscopy).  A procedure to see inside your colon and rectum (colonoscopy). How is this treated? Treatment for this condition depends on the cause. If you continue to lose a lot of blood, you may need to be treated at a hospital. Treatment may include:  Taking supplements of  iron, vitamin Q77, or folic acid.  Taking a hormone medicine (erythropoietin) that can help to stimulate red blood cell growth.  Having a blood transfusion. This may be needed if you lose a lot of blood.  Making changes to your diet.  Having surgery to remove your spleen. Follow these instructions at home:  Take over-the-counter and prescription medicines only as told by your health care provider.  Take supplements only as told by your health care provider.  Follow any diet instructions that you were given by your health care provider.  Keep all follow-up visits as told by your health care provider. This is important. Contact a health care provider if:  You develop new bleeding anywhere in the body. Get help right away if:  You are very weak.  You are short of breath.  You have pain in your abdomen or chest.  You are dizzy or feel faint.  You have trouble concentrating.  You have bloody stools, black stools, or tarry stools.  You vomit repeatedly or you vomit up blood. These symptoms may represent a serious problem  that is an emergency. Do not wait to see if the symptoms will go away. Get medical help right away. Call your local emergency services (911 in the [place] Do not drive yourself to the hospital. Summary  Anemia is a condition in which you do not have enough red blood cells or enough of a substance in your red blood cells that carries oxygen (hemoglobin).  Symptoms may occur suddenly or develop slowly.  If your anemia is mild, you may not have symptoms.  This condition is diagnosed with blood tests, a medical history, and a physical exam. Other tests may be needed.  Treatment for this condition depends on the cause of the anemia. This information is not intended to replace advice given to you by your health care provider. Make sure you discuss any questions you have with your health care provider. Document Revised: 07/07/2019 Document Reviewed: 07/07/2019 Elsevier Patient Education  2021 [employer].

## 2021-01-18 ENCOUNTER — Ambulatory Visit: Payer: Medicare Other | Admitting: Family Medicine

## 2021-01-20 ENCOUNTER — Other Ambulatory Visit: Payer: Self-pay | Admitting: Orthopedic Surgery

## 2021-01-21 ENCOUNTER — Other Ambulatory Visit: Payer: Self-pay | Admitting: Orthopedic Surgery

## 2021-01-21 DIAGNOSIS — K224 Dyskinesia of esophagus: Secondary | ICD-10-CM

## 2021-01-26 ENCOUNTER — Ambulatory Visit: Payer: Medicare Other

## 2021-01-26 ENCOUNTER — Other Ambulatory Visit: Payer: Self-pay

## 2021-01-26 DIAGNOSIS — M6281 Muscle weakness (generalized): Secondary | ICD-10-CM

## 2021-01-26 DIAGNOSIS — R2689 Other abnormalities of gait and mobility: Secondary | ICD-10-CM

## 2021-01-26 DIAGNOSIS — R293 Abnormal posture: Secondary | ICD-10-CM | POA: Diagnosis not present

## 2021-01-26 NOTE — Therapy (Signed)
[facility] [address] [place], [place], [address] Phone: [phone]   Fax:  [phone]  Physical Therapy Treatment  Patient Details  Name: Jaclyn Berry MRN: [id number] Date of Birth: April 28, 1927 Referring Provider (PT): [name], NP   Encounter Date: 01/26/2021   PT End of Session - 01/26/21 1227     Visit Number 15    Date for PT Re-Evaluation 02/27/21    Authorization Type Medicare Part A and B, KX at 15 visits    Progress Note Due on Visit 20    PT Start Time 1149   rest breaks   PT Stop Time 1226    PT Time Calculation (min) 37 min    Equipment Utilized During Treatment Gait belt    Activity Tolerance No increased pain;Patient limited by fatigue    Behavior During Therapy [facility] for tasks assessed/performed             Past Medical History:  Diagnosis Date   A-fib (HCC)    Anemia    Aortic valve stenosis    Arthritis    Depression    Gall stones    Gallstone    Low BP    Mitral valve stenosis     Past Surgical History:  Procedure Laterality Date   [place]ONOSCOPY     [place]. Normal   [facility]CP  [facility]   FRACTURE SURGERY  2016   Broken Arm   HIP SURGERY  2009   Replacement repaired     MELANOMA EXCISION  2013   right leg   mirtral valve repair  2008   ROTATOR CUFF REPAIR  2016   TOTAL HIP ARTHROPLASTY  2008   VAGINAL HYSTERECTOMY  2005    There were no vitals filed for this visit.   Subjective Assessment - 01/26/21 1202     Subjective Lapse in treatment.  Pt had hematoma on Lt LE 2 weeks ago and has been dealing with healing.  Pt has tried to exercise and move legs    Currently in Pain? No/denies                               [facility] Adult PT Treatment/Exercise - 01/26/21 0001       Ambulation/Gait   Ambulation/Gait Yes    Assistive device Rolling walker    Gait Pattern Right flexed knee in stance;Left flexed knee in  stance;Poor foot clearance - left;Poor foot clearance - right;Decreased weight shift to right;Decreased step length - left;Decreased step length - right    Ambulation Surface Level    Gait Comments walked from mat table to NuStep      Lumbar Exercises: Aerobic   Nustep L1 x 10' PT closely monitored for fatigue      Knee/Hip Exercises: Seated   Long Arc Quad Both;3 sets;5 reps    [employer] x20    Other Seated Knee/Hip Exercises seated march with blue band around knees x 10 each    Marching Limitations rockerboard seated x 3 minutes    Abduction/Adduction  Strengthening;Both;2 sets;10 reps    Abd/Adduction Limitations blue band clamshells                      PT Short Term Goals - 01/02/21 1110       PT SHORT TERM GOAL #1   Title Pt will be able to perform  intial HEP with supervision for safety    Status Achieved      PT SHORT TERM GOAL #2   Title Pt will be able to demo bed rolling Lt/Rt with supervision using proper form and muscle groups    Status Achieved      PT SHORT TERM GOAL #3   Title Pt will report at least 30% greater ease and confidence with bed mobility and transfers with supervision/min assist as needed    Status Achieved      PT SHORT TERM GOAL #4   Title Pt will be able to ambulate at least 20' using RW with min/mod SOB and recovery within 1' of sitting    Status Achieved               PT Long Term Goals - 01/11/21 1240       PT LONG TERM GOAL #5   Title Pt and Pt's daughter will learn strategies and demo improved efficiency of bed mobility including turning over in bed and transferring from bed to standing without rail (hospital bed rail interferes with getting up so can't use it).    Status On-going                   Plan - 01/26/21 1217     Clinical Impression Statement Pt with lapse in treatment due to hematoma on Lt LE that she is healing from.  Pt was able to perform seated exercise well today and walked from mat table  to NuStep using walker.  Pt required close supervision and performed all transfers independently.  Pt with set-back due to immobility from hematoma and will continue to benefit form skilled PT to address endurance, strength and independence with mobility.    PT Frequency 1x / week    PT Duration 8 weeks    PT Treatment/Interventions ADLs/Self Care Home Management;Moist Heat;Gait training;Stair training;Functional mobility training;Therapeutic activities;Therapeutic exercise;Neuromuscular re-education;Manual techniques;Patient/family education;Passive range of motion;Balance training    PT Next Visit Plan continue to address gait, strength and endurance as tolerated    PT Home Exercise Plan Access Code: [email or web address]    Consulted and Agree with Plan of Care Patient;Family member/caregiver    Family Member Consulted Pt's daughter             Patient will benefit from skilled therapeutic intervention in order to improve the following deficits and impairments:  Abnormal gait, Decreased range of motion, Difficulty walking, Cardiopulmonary status limiting activity, Postural dysfunction, Decreased strength, Decreased mobility, Decreased balance, Improper body mechanics, Pain, Decreased activity tolerance  Visit Diagnosis: Other abnormalities of gait and mobility  Abnormal posture  Muscle weakness (generalized)     Problem List Patient Active Problem List   Diagnosis Date Noted   Senile purpura (HCC) 12/09/2020   Frail elderly 12/09/2020   Unstable gait 10/25/2020   Paroxysmal atrial fibrillation (HCC) 10/25/2020   Nonrheumatic mitral valve stenosis 10/25/2020   Nonrheumatic aortic valve stenosis 10/25/2020   Fatigue 10/25/2020   Generalized abdominal pain 10/25/2020   Iron deficiency anemia secondary to inadequate dietary iron intake 10/25/2020   Hypotension 10/25/2020   Chronic combined systolic and diastolic heart failure (HCC) 10/25/2020   Chronic bilateral low back pain without  sciatica 10/25/2020   Multiple falls 10/25/2020   Slow transit constipation 10/25/2020   Poor appetite 10/25/2020   Underweight due to inadequate caloric intake 10/25/2020   Lorrene Reid, PT 01/26/21 12:29 PM   [facility] [address], [address] [place], [place], [address] Phone: [phone]   Fax:  [phone]  Name: Iyahna Obriant MRN: [id number] Date of Birth: February 20, 1927

## 2021-02-01 ENCOUNTER — Ambulatory Visit (INDEPENDENT_AMBULATORY_CARE_PROVIDER_SITE_OTHER): Payer: Medicare Other | Admitting: Family

## 2021-02-01 ENCOUNTER — Ambulatory Visit: Payer: Medicare Other

## 2021-02-01 ENCOUNTER — Encounter: Payer: Self-pay | Admitting: Family

## 2021-02-01 ENCOUNTER — Other Ambulatory Visit: Payer: Self-pay

## 2021-02-01 ENCOUNTER — Other Ambulatory Visit: Payer: Medicare Other

## 2021-02-01 VITALS — BP 110/80 | HR 81 | Temp 97.8°F | Resp 16 | Ht 63.0 in | Wt 76.4 lb

## 2021-02-01 DIAGNOSIS — F5101 Primary insomnia: Secondary | ICD-10-CM

## 2021-02-01 DIAGNOSIS — T148XXA Other injury of unspecified body region, initial encounter: Secondary | ICD-10-CM

## 2021-02-01 DIAGNOSIS — R2689 Other abnormalities of gait and mobility: Secondary | ICD-10-CM

## 2021-02-01 DIAGNOSIS — R293 Abnormal posture: Secondary | ICD-10-CM | POA: Diagnosis not present

## 2021-02-01 DIAGNOSIS — D508 Other iron deficiency anemias: Secondary | ICD-10-CM

## 2021-02-01 DIAGNOSIS — M6281 Muscle weakness (generalized): Secondary | ICD-10-CM

## 2021-02-01 LAB — CBC WITH DIFFERENTIAL/PLATELET
Absolute Monocytes: 276 cells/uL (ref 200–950)
Basophils Absolute: 18 cells/uL (ref 0–200)
Basophils Relative: 0.4 %
Eosinophils Absolute: 18 cells/uL (ref 15–500)
Eosinophils Relative: 0.4 %
HCT: 25.1 % — ABNORMAL LOW (ref 35.0–45.0)
Hemoglobin: 8.1 g/dL — ABNORMAL LOW (ref 11.7–15.5)
Lymphs Abs: 764 cells/uL — ABNORMAL LOW (ref 850–3900)
MCH: 33.3 pg — ABNORMAL HIGH (ref 27.0–33.0)
MCHC: 32.3 g/dL (ref 32.0–36.0)
MCV: 103.3 fL — ABNORMAL HIGH (ref 80.0–100.0)
MPV: 10.4 fL (ref 7.5–12.5)
Monocytes Relative: 6 %
Neutro Abs: 3524 cells/uL (ref 1500–7800)
Neutrophils Relative %: 76.6 %
Platelets: 162 10*3/uL (ref 140–400)
RBC: 2.43 10*6/uL — ABNORMAL LOW (ref 3.80–5.10)
RDW: 12 % (ref 11.0–15.0)
Total Lymphocyte: 16.6 %
WBC: 4.6 10*3/uL (ref 3.8–10.8)

## 2021-02-01 NOTE — Progress Notes (Signed)
Provider: Deylan Canterbury FNP-C  [name], NP  Patient Care Team: [name], NP as PCP - General (Adult Health Nurse Practitioner) [name], MD as PCP - Cardiology (Cardiology)  Extended Emergency Contact Information Primary Emergency Contact: [facility] Phone: [phone] Relation: Daughter Secondary Emergency Contact: Nordell,Kim Mobile Phone: [phone] Relation: Daughter  Code Status:  Full Code  Goals of care: Advanced Directive information Advanced Directives 02/01/2021  Does Patient Have a Medical Advance Directive? Yes  Type of Advance Directive Healthcare Power of Attorney  Does patient want to make changes to medical advance directive? No - Patient declined  Copy of Healthcare Power of Attorney in Chart? Yes - validated most recent copy scanned in chart (See row information)     Chief Complaint  Patient presents with   Follow-up    Examine Hematoma   Labs     Fasting Labs.    HPI:  Pt is a 87 y.o. female seen today for an acute visit for follow up hematoma.she was seen 01/16/2021 by Yolanda Bonine DNP for ED follow up for left leg hematoma.she was advised to cleanse hematoma with saline,apply Xeroform dressing,cover with telfa and wrap snugly with coban not tight.Her ASA was changed to three times per week  she was seen in ED 01/14/2021 for left leg bleeding and pain post fall at home when son attempted to get her up to asist with walking when she sudden had severe left leg pain he put back on her bed and noticed large hematoma. Daughter states hematoma site has improved but not resolved. Small area on hematoma site was opened up.Has been washing with saline and sometime with wash cloth while in the shower. She denies any fever,chills or drainage from hematoma site. Daughter also reports patient not sleeping well at night wonders whether if she can give Benadryl DM to help sleep.sedative effects of Benadryl and high risk for  falls discussed with daughter    Past Medical History:  Diagnosis Date   A-fib (HCC)    Anemia    Aortic valve stenosis    Arthritis    Depression    Gall stones    Gallstone    Low BP    Mitral valve stenosis    Past Surgical History:  Procedure Laterality Date   COLONOSCOPY     [place]. Normal   [facility]CP  [facility]   FRACTURE SURGERY  2016   Broken Arm   HIP SURGERY  2009   Replacement repaired     MELANOMA EXCISION  2013   right leg   mirtral valve repair  2008   ROTATOR CUFF REPAIR  2016   TOTAL HIP ARTHROPLASTY  2008   VAGINAL HYSTERECTOMY  2005    Allergies  Allergen Reactions   Tape Other (See Comments)    SKIN TEARS VERY EASILY!!!!   Morphine And Related Nausea And Vomiting   Sulfa Antibiotics Rash    Outpatient Encounter Medications as of 02/01/2021  Medication Sig   acetaminophen (TYLENOL) 650 MG CR tablet Take 650 mg by mouth every 12 (twelve) hours as needed for pain.   amitriptyline (ELAVIL) 50 MG tablet Take 25 mg by mouth at bedtime.   aspirin EC 81 MG tablet Take 81 mg by mouth daily. Monday, Wednesday, and Friday   bumetanide (BUMEX) 1 MG tablet Take 1 mg by mouth daily.   digoxin (LANOXIN) 0.125 MG tablet Take 1 tablet (0.125 mg total) by mouth  daily.   docusate sodium (COLACE) 100 MG capsule Take 100 mg by mouth as needed for mild constipation.   ferrous sulfate 325 (65 FE) MG tablet Take 325 mg by mouth 2 (two) times daily with a meal.   metoCLOPramide (REGLAN) 5 MG tablet TAKE 1 TABLET BY MOUTH TWICE A DAY   mirtazapine (REMERON) 30 MG tablet Take 1 tablet (30 mg total) by mouth at bedtime.   Multiple Vitamins-Minerals (PRESERVISION AREDS 2+MULTI VIT PO) Take 1 capsule by mouth at bedtime.   ondansetron (ZOFRAN) 4 MG tablet Take 4 mg by mouth 3 (three) times daily as needed for nausea or vomiting.   pantoprazole (PROTONIX) 40 MG tablet TAKE 1 TABLET BY MOUTH TWICE A DAY   potassium chloride SA (KLOR-CON) 20 MEQ  tablet Take 1 tablet (20 mEq total) by mouth daily.   sennosides-docusate sodium (SENOKOT-S) 8.6-50 MG tablet Take 1 tablet by mouth every other day.   sildenafil (REVATIO) 20 MG tablet Take 1 tablet (20 mg total) by mouth 2 (two) times daily.   traMADol (ULTRAM) 50 MG tablet Take 50 mg by mouth every 12 (twelve) hours as needed (for pain).   vitamin B-12 (CYANOCOBALAMIN) 1000 MCG tablet Take 1,000 mcg by mouth daily.   vitamin C (ASCORBIC ACID) 500 MG tablet Take 1 tablet (500 mg total) by mouth 2 (two) times daily.   No facility-administered encounter medications on file as of 02/01/2021.    Review of Systems  Constitutional:  Negative for appetite change, chills, fatigue, fever and unexpected weight change.  Respiratory:  Negative for cough, chest tightness, shortness of breath and wheezing.   Cardiovascular:  Negative for chest pain, palpitations and leg swelling.  Musculoskeletal:  Positive for gait problem. Negative for arthralgias and back pain.  Skin:  Negative for color change, pallor and rash.       Left leg hematoma   Neurological:  Negative for dizziness, weakness, light-headedness, numbness and headaches.  Hematological:  Bruises/bleeds easily.       Easily bruises   Immunization History  Administered Date(s) Administered   Moderna Sars-Covid-2 Vaccination 08/26/2019, 09/23/2019, 07/11/2020   Pertinent  Health Maintenance Due  Topic Date Due   DEXA SCAN  Never done   PNA vac Low Risk Adult (1 of 2 - PCV13) Never done   INFLUENZA VACCINE  03/13/2021   Fall Risk  02/01/2021 01/16/2021 12/08/2020 10/21/2020  Falls in the past year? 0 0 0 1  Number falls in past yr: 0 0 0 0  Injury with Fall? 0 0 0 0   Functional Status Survey:    Vitals:   02/01/21 0946  BP: 110/80  Pulse: 81  Resp: 16  Temp: 97.8 F (36.6 C)  SpO2: 96%  Weight: 76 lb 6.4 oz (34.7 kg)  Height: 5\' 3"  (1.6 m)   Body mass index is 13.53 kg/m. Physical Exam Vitals reviewed.  Constitutional:       General: She is not in acute distress.    Appearance: Normal appearance. She is cachectic. She is not ill-appearing or diaphoretic.  Cardiovascular:     Rate and Rhythm: Normal rate and regular rhythm.     Pulses: Normal pulses.     Heart sounds: Normal heart sounds. No murmur heard.   No friction rub. No gallop.  Pulmonary:     Effort: Pulmonary effort is normal. No respiratory distress.     Breath sounds: Normal breath sounds. No wheezing, rhonchi or rales.  Chest:     Chest  wall: No tenderness.  Musculoskeletal:        General: No swelling or tenderness.     Right lower leg: No edema.     Comments: Unsteady gait  Skin:    General: Skin is warm and dry.     Coloration: Skin is not pale.     Findings: No erythema or rash.     Comments: Bilateral upper and lower extremities scattered purple bruise. Left lower lateral leg hematoma 10% skin flap off wound bed moist and pink in color without any drainage.surrounding intact and non-tender.wound cleanse with saline,pat dry ,triple antibiotic ointment applied to open wound area covered with Telfa,4 X 4 Gauze and secure with Kerlix wrap from base of toes to knee high.tolerated procedure well.   Neurological:     Mental Status: She is alert and oriented to person, place, and time.     Cranial Nerves: No cranial nerve deficit.     Motor: No weakness.     Gait: Gait abnormal.  Psychiatric:        Mood and Affect: Mood normal.        Speech: Speech normal.        Behavior: Behavior normal.        Thought Content: Thought content normal.        Judgment: Judgment normal.    Labs reviewed: Recent Labs    10/21/20 0000 11/04/20 1247 01/14/21 1832  NA 136 137 134*  K 4.0 4.4 3.7  CL 97* 93* 96*  CO2 26 24 30   GLUCOSE 164* 120* 103*  BUN 25 29 31*  CREATININE 1.02* 1.07* 1.03*  CALCIUM 8.6 8.9 9.1   Recent Labs    01/14/21 1902  AST 35  ALT 15  ALKPHOS 55  BILITOT 0.7  PROT 7.2  ALBUMIN 3.2*   Recent Labs     10/21/20 0000 01/14/21 1832 02/01/21 0000  WBC 6.5 6.0 4.6  NEUTROABS 5,005 4.5 3,524  HGB 10.9* 8.5* 8.1*  HCT 32.2* 26.4* 25.1*  MCV 92.5 107.8* 103.3*  PLT 179 191 162   No results found for: TSH No results found for: HGBA1C No results found for: CHOL, HDL, LDLCALC, LDLDIRECT, TRIG, CHOLHDL  Significant Diagnostic Results in last 30 days:  No results found.  Assessment/Plan  1. Hematoma Afebrile. wound cleanse with saline,pat dry ,triple antibiotic ointment applied to open wound area covered with Telfa,4 X 4 Gauze and secure with Kerlix wrap from base of toes to knee high.  - daughter advised to continue with current wound dressing changes and notify provider's office for any signs of infection. - will follow up in 2 weeks to re-evaluate  - continue with protein supplement  - CBC with Differential/Platelet  2. Iron deficiency anemia secondary to inadequate dietary iron intake Latest Hgb was 8.5 will recheck today. - continue on ferrous sulfate  - CBC with Differential/Platelet   3. Primary insomnia Avoid benadryl and other sedative due to high risk for falls  - Advised to use OTC Melatonin 3-6 mg tablet at bedtime to help with sleep.    Family/ staff Communication: Reviewed plan of care with patient and daughter verbalized understanding   Labs/tests ordered: - CBC with Differential/Platelet  Next Appointment: 2 weeks for left leg hematoma site evaluation   02/03/21, NP

## 2021-02-01 NOTE — Patient Instructions (Signed)
-   continue current dressing changes.Notify provider for any signs of infection

## 2021-02-01 NOTE — Therapy (Signed)
[facility] [address], [address] [place], [place], [address] Phone: [phone]   Fax:  [phone]  Physical Therapy Treatment  Patient Details  Name: Jaclyn Berry MRN: [id number] Date of Birth: March 31, 1927 Referring Provider (PT): [name], NP   Encounter Date: 02/01/2021   PT End of Session - 02/01/21 1223     Visit Number 16    Date for PT Re-Evaluation 02/27/21    Authorization Type Medicare Part A and B, KX at 15 visits    Progress Note Due on Visit 20    PT Start Time 1150    PT Stop Time 1230    PT Time Calculation (min) 40 min    Activity Tolerance No increased pain;Patient limited by fatigue    Behavior During Therapy [facility] for tasks assessed/performed             Past Medical History:  Diagnosis Date   A-fib (HCC)    Anemia    Aortic valve stenosis    Arthritis    Depression    Gall stones    Gallstone    Low BP    Mitral valve stenosis     Past Surgical History:  Procedure Laterality Date   [place]ONOSCOPY     [place]. Normal   [facility]CP  09/[facility]   FRACTURE SURGERY  2016   Broken Arm   HIP SURGERY  2009   Replacement repaired     MELANOMA EXCISION  2013   right leg   mirtral valve repair  2008   ROTATOR CUFF REPAIR  2016   TOTAL HIP ARTHROPLASTY  2008   VAGINAL HYSTERECTOMY  2005    There were no vitals filed for this visit.                      [facility] Adult PT Treatment/Exercise - 02/01/21 0001       Ambulation/Gait   Gait Comments walked from mat table to NuStep      Lumbar Exercises: Aerobic   Nustep L1 x 10' PT closely monitored for fatigue      Knee/Hip Exercises: Seated   Long Arc Quad Both;3 sets;5 reps    [employer] x20    Other Seated Knee/Hip Exercises seated march with blue band around knees x 10 each    Abduction/Adduction  Strengthening;Both;2 sets;10 reps    Abd/Adduction Limitations blue band clamshells       Shoulder Exercises: Seated   Other Seated Exercises biceps curls: 1# 2x10                    PT Education - 02/01/21 1219     Education Details discussion regarding best way to negotiate steps: lead up with left as this is her stronger leg    Person(s) Educated Patient;Child(ren)    Methods Explanation;Demonstration    Comprehension Verbalized understanding              PT Short Term Goals - 01/02/21 1110       PT SHORT TERM GOAL #1   Title Pt will be able to perform intial HEP with supervision for safety    Status Achieved      PT SHORT TERM GOAL #2   Title Pt will be able to demo bed rolling Lt/Rt with supervision using proper form and muscle groups    Status Achieved      PT SHORT TERM GOAL #3  Title Pt will report at least 30% greater ease and confidence with bed mobility and transfers with supervision/min assist as needed    Status Achieved      PT SHORT TERM GOAL #4   Title Pt will be able to ambulate at least 20' using RW with min/mod SOB and recovery within 1' of sitting    Status Achieved               PT Long Term Goals - 01/11/21 1240       PT LONG TERM GOAL #5   Title Pt and Pt's daughter will learn strategies and demo improved efficiency of bed mobility including turning over in bed and transferring from bed to standing without rail (hospital bed rail interferes with getting up so can't use it).    Status On-going                   Plan - 02/01/21 1223     Clinical Impression Statement Pt was able to perform seated exercise well today and walked from mat table to NuStep using walker.  Pt required close supervision and performed all transfers independently.  Pt with set-back due to immobility from hematoma and has stayed active as able.   PT discussed with pt and her daughter, the best strategy to negotiate steps.  This was not practiced due to fatigue.  Pt had an appointment prior to PT session.  Slow progress regarding mobility  due to complex medical history.  Pt will continue to benefit form skilled PT to address endurance, strength and independence with mobility.    PT Frequency 1x / week    PT Duration 8 weeks    PT Treatment/Interventions ADLs/Self Care Home Management;Moist Heat;Gait training;Stair training;Functional mobility training;Therapeutic activities;Therapeutic exercise;Neuromuscular re-education;Manual techniques;Patient/family education;Passive range of motion;Balance training    PT Next Visit Plan continue to address gait, strength and endurance as tolerated    PT Home Exercise Plan Access Code: [email or web address]    Consulted and Agree with Plan of Care Patient;Family member/caregiver    Family Member Consulted Pt's daughter             Patient will benefit from skilled therapeutic intervention in order to improve the following deficits and impairments:  Abnormal gait, Decreased range of motion, Difficulty walking, Cardiopulmonary status limiting activity, Postural dysfunction, Decreased strength, Decreased mobility, Decreased balance, Improper body mechanics, Pain, Decreased activity tolerance  Visit Diagnosis: Other abnormalities of gait and mobility  Abnormal posture  Muscle weakness (generalized)     Problem List Patient Active Problem List   Diagnosis Date Noted   Senile purpura (HCC) 12/09/2020   Frail elderly 12/09/2020   Unstable gait 10/25/2020   Paroxysmal atrial fibrillation (HCC) 10/25/2020   Nonrheumatic mitral valve stenosis 10/25/2020   Nonrheumatic aortic valve stenosis 10/25/2020   Fatigue 10/25/2020   Generalized abdominal pain 10/25/2020   Iron deficiency anemia secondary to inadequate dietary iron intake 10/25/2020   Hypotension 10/25/2020   Chronic combined systolic and diastolic heart failure (HCC) 10/25/2020   Chronic bilateral low back pain without sciatica 10/25/2020   Multiple falls 10/25/2020   Slow transit constipation 10/25/2020   Poor appetite 10/25/2020    Underweight due to inadequate caloric intake 10/25/2020    Lorrene Reid, PT 02/01/21 12:32 PM   [facility] [address], [address] [place], [place], [address] Phone: [phone]   Fax:  [phone]  Name: Jaclyn Berry MRN: [id number] Date of Birth: December 10, 1926

## 2021-02-06 ENCOUNTER — Other Ambulatory Visit: Payer: Self-pay

## 2021-02-06 ENCOUNTER — Ambulatory Visit: Payer: Medicare Other

## 2021-02-06 DIAGNOSIS — R2689 Other abnormalities of gait and mobility: Secondary | ICD-10-CM

## 2021-02-06 DIAGNOSIS — R293 Abnormal posture: Secondary | ICD-10-CM

## 2021-02-06 DIAGNOSIS — M6281 Muscle weakness (generalized): Secondary | ICD-10-CM

## 2021-02-06 NOTE — Therapy (Signed)
[facility] [address], [address] [place], [place], [address] Phone: [phone]   Fax:  [phone]  Physical Therapy Treatment  Patient Details  Name: Tylesha Gibeault MRN: [id number] Date of Birth: Sep 23, 1926 Referring Provider (PT): [name], NP   Encounter Date: 02/06/2021   PT End of Session - 02/06/21 1214     Visit Number 17    Date for PT Re-Evaluation 02/27/21    Authorization Type Medicare Part A and B, [place]X at 15 visits    Progress Note Due on Visit 20    PT Start Time 1144    PT Stop Time 1224    PT Time Calculation (min) 40 min    Activity Tolerance No increased pain;Patient limited by fatigue    Behavior During Therapy [facility] for tasks assessed/performed             Past Medical History:  Diagnosis Date   A-fib (HCC)    Anemia    Aortic valve stenosis    Arthritis    Depression    Gall stones    Gallstone    Low BP    Mitral valve stenosis     Past Surgical History:  Procedure Laterality Date   [place]OSCOPY     [place] Normal   [facility]CP  09/[facility]   [facility]   FRACTURE SURGERY  2016   Broken Arm   HIP SURGERY  2009   Replacement repaired     MELANOMA EXCISION  2013   right leg   mirtral valve repair  2008   ROTATOR CUFF REPAIR  2016   TOTAL HIP ARTHROPLASTY  2008   VAGINAL HYSTERECTOMY  2005    There were no vitals filed for this visit.   Subjective Assessment - 02/06/21 1215     Subjective My leg is still healing.  I am doing some short walking at home.  Still with a lot of difficulty going up steps.    Currently in Pain? No/denies                               [facility] Adult PT Treatment/Exercise - 02/06/21 0001       Ambulation/Gait   Ambulation/Gait Yes    Assistive device Rolling walker    Gait Pattern Right flexed knee in stance;Left flexed knee in stance;Poor foot clearance - left;Poor foot clearance - right;Decreased weight shift to  right;Decreased step length - left;Decreased step length - right    Ambulation Surface Level    Gait Comments walked 15 feet, seated rest break and water.  walked additional 20 feet to the NuStep and required rest before begining this activity.      Lumbar Exercises: Aerobic   Nustep L1 x 10' PT closely monitored for fatigue      Knee/Hip Exercises: Standing   Other Standing Knee Exercises step over low hurdle x2- too tall for pt do to independently      Knee/Hip Exercises: Seated   Long Arc Quad Both;3 sets;5 reps    [employer] Limitations rockerboard seated x 3 minutes    Abduction/Adduction  Strengthening;Both;2 sets;10 reps    Abd/Adduction Limitations blue band clamshells      Shoulder Exercises: Seated   Other Seated Exercises biceps curls: 2# 2x10  PT Short Term Goals - 02/06/21 1216       PT SHORT TERM GOAL #1   Title Pt will be able to perform intial HEP with supervision for safety    Status Achieved      PT SHORT TERM GOAL #2   Title Pt will be able to demo bed rolling Lt/Rt with supervision using proper form and muscle groups    Status Achieved      PT SHORT TERM GOAL #3   Title Pt will report at least 30% greater ease and confidence with bed mobility and transfers with supervision/min assist as needed    Status Achieved      PT SHORT TERM GOAL #4   Title Pt will be able to ambulate at least 20' using RW with min/mod SOB and recovery within 1' of sitting    Status Achieved               PT Long Term Goals - 02/06/21 1217       PT LONG TERM GOAL #1   Title Pt will improve functional strength to allow for ind/supervision only with bed mobility and chair transfers    Baseline CGA in the clinic    Status On-going      PT LONG TERM GOAL #2   Title Pt will be able to demo coordination of step ups and downs with caregiver or PT min assist to simulate entry stairs access (4 stairs no railing) for improved  safety.    Baseline unable to lift leg over pool noodle/6" hurdle in standing    Status On-going      PT LONG TERM GOAL #4   Title Pt will be able to ambulate at least 40' with RW and brief standing or sitting break as needed for SOB secondary to heart condition to demo improved gait endurance for household amb (with supervision)    Status On-going                   Plan - 02/06/21 1149     Clinical Impression Statement Pt was able to perform seated exercise well today and walked from mat table to NuStep using walker.  Pt tried working on stepping over low hurdle on the floor to imrove ability to negotiate steps.  Pt was signifincantly challenged by this then had Rt shoulder pain that limited her ability to transfer to standing for additional practice.  Pt performed all transfers independently with CGA for safety.  Pt with set-back due to immobility from hematoma and has stayed active as able. Slow progress regarding mobility due to complex medical history.  Pt will continue to benefit form skilled PT to address endurance, strength and independence with mobility.    Examination-Activity Limitations Bathing;Transfers;Locomotion Level;Bed Mobility;Bend;Sit;Stairs;Stand;Toileting;Hygiene/Grooming;Dressing    PT Frequency 1x / week    PT Duration 8 weeks    PT Treatment/Interventions ADLs/Self Care Home Management;Moist Heat;Gait training;Stair training;Functional mobility training;Therapeutic activities;Therapeutic exercise;Neuromuscular re-education;Manual techniques;Patient/family education;Passive range of motion;Balance training    PT Next Visit Plan continue to address gait, strength and endurance as tolerated. Test walking distance with rolling walker to assess LTG  ERO in 2 weeks to determine if PT will continue.  Slow progress due to set-back with hematoma.    PT Home Exercise Plan Access Code: [email or web address]    Recommended Other Services recert is signed    Consulted and Agree with Plan  of Care Patient;Family member/caregiver    Family Member Consulted Pt's daughter  Patient will benefit from skilled therapeutic intervention in order to improve the following deficits and impairments:  Abnormal gait, Decreased range of motion, Difficulty walking, Cardiopulmonary status limiting activity, Postural dysfunction, Decreased strength, Decreased mobility, Decreased balance, Improper body mechanics, Pain, Decreased activity tolerance  Visit Diagnosis: Abnormal posture  Muscle weakness (generalized)  Other abnormalities of gait and mobility     Problem List Patient Active Problem List   Diagnosis Date Noted   Senile purpura (HCC) 12/09/2020   Frail elderly 12/09/2020   Unstable gait 10/25/2020   Paroxysmal atrial fibrillation (HCC) 10/25/2020   Nonrheumatic mitral valve stenosis 10/25/2020   Nonrheumatic aortic valve stenosis 10/25/2020   Fatigue 10/25/2020   Generalized abdominal pain 10/25/2020   Iron deficiency anemia secondary to inadequate dietary iron intake 10/25/2020   Hypotension 10/25/2020   Chronic combined systolic and diastolic heart failure (HCC) 10/25/2020   Chronic bilateral low back pain without sciatica 10/25/2020   Multiple falls 10/25/2020   Slow transit constipation 10/25/2020   Poor appetite 10/25/2020   Underweight due to inadequate caloric intake 10/25/2020    Lorrene Reid, PT 02/06/21 12:19 PM   [facility] [address] [place], [place], [address] Phone: [phone]   Fax:  [phone]  Name: Jendaya Gossett MRN: [id number] Date of Birth: [birth year]

## 2021-02-08 ENCOUNTER — Encounter: Payer: Self-pay | Admitting: Internal Medicine

## 2021-02-14 ENCOUNTER — Other Ambulatory Visit: Payer: Self-pay

## 2021-02-14 ENCOUNTER — Encounter: Payer: Self-pay | Admitting: Physical Therapy

## 2021-02-14 ENCOUNTER — Ambulatory Visit: Payer: Medicare Other | Attending: Orthopedic Surgery | Admitting: Physical Therapy

## 2021-02-14 DIAGNOSIS — R293 Abnormal posture: Secondary | ICD-10-CM | POA: Insufficient documentation

## 2021-02-14 DIAGNOSIS — R2689 Other abnormalities of gait and mobility: Secondary | ICD-10-CM | POA: Diagnosis present

## 2021-02-14 DIAGNOSIS — M6281 Muscle weakness (generalized): Secondary | ICD-10-CM | POA: Insufficient documentation

## 2021-02-14 NOTE — Therapy (Signed)
[facility] [address], [address] [place], [place], [address] Phone: [phone]   Fax:  [phone]  Physical Therapy Treatment  Patient Details  Name: Jaclyn Berry MRN: [id number] Date of Birth: January 29, 1927 Referring Provider (PT): [name], NP   Encounter Date: 02/14/2021   PT End of Session - 02/14/21 1525     Visit Number 18    Date for PT Re-Evaluation 02/27/21    Authorization Type Medicare Part A and B, KX at 15 visits    Progress Note Due on Visit 20    PT Start Time 1445    PT Stop Time 1520    PT Time Calculation (min) 35 min    Equipment Utilized During Treatment Gait belt    Activity Tolerance No increased pain;Patient limited by fatigue    Behavior During Therapy [facility] for tasks assessed/performed             Past Medical History:  Diagnosis Date   A-fib (HCC)    Anemia    Aortic valve stenosis    Arthritis    Depression    Gall stones    Gallstone    Low BP    Mitral valve stenosis     Past Surgical History:  Procedure Laterality Date   [place]OSCOPY     [place]. Normal   [facility]CP  [facility]   FRACTURE SURGERY  2016   Broken Arm   HIP SURGERY  2009   Replacement repaired     MELANOMA EXCISION  2013   right leg   mirtral valve repair  2008   ROTATOR CUFF REPAIR  2016   TOTAL HIP ARTHROPLASTY  2008   VAGINAL HYSTERECTOMY  2005    There were no vitals filed for this visit.   Subjective Assessment - 02/14/21 1452     Subjective Steps are still very hard especially if we have been out doing things.  I am very low energy today.    Pertinent History PMH: h/o fall, Rt hip replacement with revision after a fall (2008/2009) with ongoing pain since femur fraction/revision, a-fib, reduced ejection fraction    Currently in Pain? No/denies                               [facility] Adult PT Treatment/Exercise - 02/14/21 0001       Transfers    Transfers Sit to Stand    Sit to Stand 5: Supervision;4: Min assist    Comments increased fatigue for transfers today within session      Ambulation/Gait   Ambulation/Gait Yes    Assistive device Rolling walker    Gait Pattern Right flexed knee in stance;Left flexed knee in stance    Ambulation Surface Level    Pre-Gait Activities standing weight shifting, fatigue limited to 10 sec, 2 rounds spaced out within session    Gait Comments 15', very fatigued      Exercises   Exercises Lumbar;Knee/Hip;Shoulder      Lumbar Exercises: Aerobic   Nustep L1 x 5' PT closely monitored for fatigue      Knee/Hip Exercises: Seated   Long Arc Quad Right;Left;3 sets;5 reps    Ball Squeeze 2x10, hold 3 sec    Marching Limitations rockerboard seated x 3 minutes    Abduction/Adduction  Strengthening;Both;2 sets;10 reps    Abd/Adduction Limitations blue band clamshells  PT Short Term Goals - 02/06/21 1216       PT SHORT TERM GOAL #1   Title Pt will be able to perform intial HEP with supervision for safety    Status Achieved      PT SHORT TERM GOAL #2   Title Pt will be able to demo bed rolling Lt/Rt with supervision using proper form and muscle groups    Status Achieved      PT SHORT TERM GOAL #3   Title Pt will report at least 30% greater ease and confidence with bed mobility and transfers with supervision/min assist as needed    Status Achieved      PT SHORT TERM GOAL #4   Title Pt will be able to ambulate at least 20' using RW with min/mod SOB and recovery within 1' of sitting    Status Achieved               PT Long Term Goals - 02/06/21 1217       PT LONG TERM GOAL #1   Title Pt will improve functional strength to allow for ind/supervision only with bed mobility and chair transfers    Baseline CGA in the clinic    Status On-going      PT LONG TERM GOAL #2   Title Pt will be able to demo coordination of step ups and downs with caregiver or PT  min assist to simulate entry stairs access (4 stairs no railing) for improved safety.    Baseline unable to lift leg over pool noodle/6" hurdle in standing    Status On-going      PT LONG TERM GOAL #4   Title Pt will be able to ambulate at least 40' with RW and brief standing or sitting break as needed for SOB secondary to heart condition to demo improved gait endurance for household amb (with supervision)    Status On-going                   Plan - 02/14/21 1510     Clinical Impression Statement Pt had a busy weekend with her 2 daughters (one in from out of town) and went to pool with assistance and got in and moved legs/floated.  She also had multiple meals outside on back deck and went shopping (using W/C).  This may explain why Pt felt very low energy and had reduced tolerance of ther ex and gait distance today.  She needed min assist with transfers today and ambulated with flexed knees today > past visits, demo'ing functional fatigue today.  Shorter session due to fatigue.    Comorbidities a-fib, Rt THR with femur fracture revision    PT Frequency 1x / week    PT Duration 8 weeks    PT Treatment/Interventions ADLs/Self Care Home Management;Moist Heat;Gait training;Stair training;Functional mobility training;Therapeutic activities;Therapeutic exercise;Neuromuscular re-education;Manual techniques;Patient/family education;Passive range of motion;Balance training    PT Next Visit Plan continue to address gait, strength and endurance as tolerated. Test walking distance with rolling walker to assess LTG  ERO in 2 weeks to determine if PT will continue.  Slow progress due to set-back with hematoma.    PT Home Exercise Plan Access Code: [email or web address]    Consulted and Agree with Plan of Care Patient;Family member/caregiver    Family Member Consulted Pt's daughter             Patient will benefit from skilled therapeutic intervention in order to improve the following deficits and  impairments:  Visit Diagnosis: Abnormal posture  Muscle weakness (generalized)  Other abnormalities of gait and mobility     Problem List Patient Active Problem List   Diagnosis Date Noted   Senile purpura (HCC) 12/09/2020   Frail elderly 12/09/2020   Unstable gait 10/25/2020   Paroxysmal atrial fibrillation (HCC) 10/25/2020   Nonrheumatic mitral valve stenosis 10/25/2020   Nonrheumatic aortic valve stenosis 10/25/2020   Fatigue 10/25/2020   Generalized abdominal pain 10/25/2020   Iron deficiency anemia secondary to inadequate dietary iron intake 10/25/2020   Hypotension 10/25/2020   Chronic combined systolic and diastolic heart failure (HCC) 10/25/2020   Chronic bilateral low back pain without sciatica 10/25/2020   Multiple falls 10/25/2020   Slow transit constipation 10/25/2020   Poor appetite 10/25/2020   Underweight due to inadequate caloric intake 10/25/2020    Agron Swiney, PT 02/14/21 3:27 PM   [facility] [address], [address] [place], [place], [address] Phone: [phone]   Fax:  [phone]  Name: Jaclyn Berry MRN: [id number] Date of Birth: 1926/09/23

## 2021-02-15 ENCOUNTER — Encounter: Payer: Medicare Other | Admitting: Physical Therapy

## 2021-02-17 ENCOUNTER — Other Ambulatory Visit: Payer: Self-pay | Admitting: Orthopedic Surgery

## 2021-02-22 ENCOUNTER — Ambulatory Visit: Payer: Medicare Other

## 2021-02-22 ENCOUNTER — Other Ambulatory Visit: Payer: Self-pay

## 2021-02-22 DIAGNOSIS — R2689 Other abnormalities of gait and mobility: Secondary | ICD-10-CM

## 2021-02-22 DIAGNOSIS — R293 Abnormal posture: Secondary | ICD-10-CM

## 2021-02-22 DIAGNOSIS — M6281 Muscle weakness (generalized): Secondary | ICD-10-CM

## 2021-02-22 NOTE — Therapy (Addendum)
[facility] [address] [place], [place], [address] Phone: [phone]   Fax:  [phone]  Physical Therapy Treatment  Patient Details  Name: Jaclyn Berry MRN: [id number] Date of Birth: [birth year] Referring Provider (PT): [name], NP   Encounter Date: 02/22/2021   PT End of Session - 02/22/21 1233     Visit Number 19    Date for PT Re-Evaluation 02/27/21    Authorization Type Medicare Part A and B, [place]X at 15 visits    Progress Note Due on Visit 20    PT Start Time 1148    PT Stop Time 1224    PT Time Calculation (min) 36 min    Activity Tolerance No increased pain;Patient limited by fatigue    Behavior During Therapy [facility] for tasks assessed/performed             Past Medical History:  Diagnosis Date   A-fib (HCC)    Anemia    Aortic valve stenosis    Arthritis    Depression    Gall stones    Gallstone    Low BP    Mitral valve stenosis     Past Surgical History:  Procedure Laterality Date   [place]OS[place]     [place] Normal   [facility]CP  09/[facility]   FRACTURE SURGERY  2016   Broken Arm   HIP SURGERY  2009   Replacement repaired     MELANOMA EXCISION  2013   right leg   mirtral valve repair  2008   ROTATOR CUFF REPAIR  2016   TOTAL HIP ARTHROPLASTY  2008   VAGINAL HYSTERECTOMY  2005    There were no vitals filed for this visit.   Subjective Assessment - 02/22/21 1151     Subjective Hemaglobin has been low and pt has had shortness of breath.    Currently in Pain? No/denies                               [facility] Adult PT Treatment/Exercise - 02/22/21 0001       Lumbar Exercises: Aerobic   Nustep L1 x 10' PT closely monitored for fatigue      Knee/Hip Exercises: Seated   Ball Squeeze 2x10, hold 3 sec    Marching Limitations rockerboard seated x 3 minutes      Shoulder Exercises: Seated   Other Seated Exercises biceps curls: 2#  2x10                      PT Short Term Goals - 02/06/21 1216       PT SHORT TERM GOAL #1   Title Pt will be able to perform intial HEP with supervision for safety    Status Achieved      PT SHORT TERM GOAL #2   Title Pt will be able to demo bed rolling Lt/Rt with supervision using proper form and muscle groups    Status Achieved      PT SHORT TERM GOAL #3   Title Pt will report at least 30% greater ease and confidence with bed mobility and transfers with supervision/min assist as needed    Status Achieved      PT SHORT TERM GOAL #4   Title Pt will be able to ambulate at least 20' using RW with min/mod SOB and recovery within 1' of sitting  Status Achieved               PT Long Term Goals - 02/06/21 1217       PT LONG TERM GOAL #1   Title Pt will improve functional strength to allow for ind/supervision only with bed mobility and chair transfers    Baseline CGA in the clinic    Status On-going      PT LONG TERM GOAL #2   Title Pt will be able to demo coordination of step ups and downs with caregiver or PT min assist to simulate entry stairs access (4 stairs no railing) for improved safety.    Baseline unable to lift leg over pool noodle/6" hurdle in standing    Status On-going      PT LONG TERM GOAL #4   Title Pt will be able to ambulate at least 40' with RW and brief standing or sitting break as needed for SOB secondary to heart condition to demo improved gait endurance for household amb (with supervision)    Status On-going                   Plan - 02/22/21 1235     Clinical Impression Statement Pt has had low hemoglobin levels and pt arrived with low energy today.  Pt required close supervision during treatment to provide tactile cues and to monitor for fatigue. Pt and her daughter report that PT helps to improve mobility and strength required for transfers for her care at home.  PT will reassess next week.  Progress has been slow due to  hematoma on her Rt leg and immobility required while this was healing.    PT Frequency 1x / week    PT Duration 8 weeks    PT Treatment/Interventions ADLs/Self Care Home Management;Moist Heat;Gait training;Stair training;Functional mobility training;Therapeutic activities;Therapeutic exercise;Neuromuscular re-education;Manual techniques;Patient/family education;Passive range of motion;Balance training    PT Next Visit Plan 20th visit, ERO for 1x/wk for 6 weeks probable.  Strength, mobility and transfers.    PT Home Exercise Plan Access Code: [email or web address]    Consulted and Agree with Plan of Care Patient;Family member/caregiver    Family Member Consulted Pt's daughter             Patient will benefit from skilled therapeutic intervention in order to improve the following deficits and impairments:  Abnormal gait, Decreased range of motion, Difficulty walking, Cardiopulmonary status limiting activity, Postural dysfunction, Decreased strength, Decreased mobility, Decreased balance, Improper body mechanics, Pain, Decreased activity tolerance  Visit Diagnosis: Abnormal posture  Muscle weakness (generalized)  Other abnormalities of gait and mobility     Problem List Patient Active Problem List   Diagnosis Date Noted   Senile purpura (HCC) 12/09/2020   Frail elderly 12/09/2020   Unstable gait 10/25/2020   Paroxysmal atrial fibrillation (HCC) 10/25/2020   Nonrheumatic mitral valve stenosis 10/25/2020   Nonrheumatic aortic valve stenosis 10/25/2020   Fatigue 10/25/2020   Generalized abdominal pain 10/25/2020   Iron deficiency anemia secondary to inadequate dietary iron intake 10/25/2020   Hypotension 10/25/2020   Chronic combined systolic and diastolic heart failure (HCC) 10/25/2020   Chronic bilateral low back pain without sciatica 10/25/2020   Multiple falls 10/25/2020   Slow transit constipation 10/25/2020   Poor appetite 10/25/2020   Underweight due to inadequate caloric intake  10/25/2020    Jaclyn Berry, PT 02/23/21 7:42 AM   [facility] [address], [address] [place], [place], [address] Phone: [phone]  Fax:  [phone]  Name: Jaclyn Berry MRN: [id number] Date of Birth: 08-30-1926

## 2021-02-23 ENCOUNTER — Ambulatory Visit: Payer: Medicare Other | Admitting: Orthopedic Surgery

## 2021-02-23 ENCOUNTER — Ambulatory Visit (INDEPENDENT_AMBULATORY_CARE_PROVIDER_SITE_OTHER): Payer: Medicare Other | Admitting: Orthopedic Surgery

## 2021-02-23 ENCOUNTER — Other Ambulatory Visit: Payer: Self-pay

## 2021-02-23 ENCOUNTER — Encounter: Payer: Self-pay | Admitting: Orthopedic Surgery

## 2021-02-23 ENCOUNTER — Other Ambulatory Visit: Payer: Medicare Other

## 2021-02-23 VITALS — BP 110/70 | HR 78 | Resp 18 | Ht 63.0 in | Wt 74.2 lb

## 2021-02-23 DIAGNOSIS — D649 Anemia, unspecified: Secondary | ICD-10-CM | POA: Diagnosis not present

## 2021-02-23 DIAGNOSIS — I480 Paroxysmal atrial fibrillation: Secondary | ICD-10-CM

## 2021-02-23 DIAGNOSIS — G8929 Other chronic pain: Secondary | ICD-10-CM

## 2021-02-23 DIAGNOSIS — R54 Age-related physical debility: Secondary | ICD-10-CM

## 2021-02-23 DIAGNOSIS — D508 Other iron deficiency anemias: Secondary | ICD-10-CM

## 2021-02-23 DIAGNOSIS — R0602 Shortness of breath: Secondary | ICD-10-CM

## 2021-02-23 DIAGNOSIS — R636 Underweight: Secondary | ICD-10-CM

## 2021-02-23 DIAGNOSIS — F5101 Primary insomnia: Secondary | ICD-10-CM

## 2021-02-23 DIAGNOSIS — T148XXA Other injury of unspecified body region, initial encounter: Secondary | ICD-10-CM | POA: Diagnosis not present

## 2021-02-23 DIAGNOSIS — M545 Low back pain, unspecified: Secondary | ICD-10-CM

## 2021-02-23 DIAGNOSIS — Z85828 Personal history of other malignant neoplasm of skin: Secondary | ICD-10-CM

## 2021-02-23 DIAGNOSIS — K224 Dyskinesia of esophagus: Secondary | ICD-10-CM

## 2021-02-23 DIAGNOSIS — K5901 Slow transit constipation: Secondary | ICD-10-CM

## 2021-02-23 DIAGNOSIS — R2681 Unsteadiness on feet: Secondary | ICD-10-CM

## 2021-02-23 NOTE — Progress Notes (Signed)
Careteam: Patient Care Team: [name], NP as PCP - General (Adult Health Nurse Practitioner) [name], MD as PCP - Cardiology (Cardiology)  Seen by: Hazle Nordmann, AGNP-C  PLACE OF SERVICE:  [facility]  Advanced Directive information    Allergies  Allergen Reactions   Tape Other (See Comments)    SKIN TEARS VERY EASILY!!!!   Morphine And Related Nausea And Vomiting   Sulfa Antibiotics Rash    No chief complaint on file.    HPI: Patient is a 87 y.o. female seen today for medical management of chronic conditions.   Daughter present for visit.   06/04 she was having extreme leg pain at home. She was taken to ED by family and diagnosed with left leg hematoma. Daughter has been applying antibiotic ointment and nonadherent dressing daily. Reports improvement today. Asking to leave it open to air a few hours daily.    She continues to have intermittent nausea making it hard for her to eat at times. She is drinking Boost two times daily and trying to eat small meals. Continues to use Reglan 5 mg bid prior to meals. She has lost 2 lbs since last visit.   Continues to see PT once a week for strength training. Walking small distances at home. Will use wheelchair for long distances. No recent falls. In the past 3 weeks she has been more short of breath and tired.   Back pain stable with tramadol 50 mg at night.   Reports insomnia at night due to nocturia. Taking melatonin 5 mg every night.   Discussed immunizations with daughter. Records have not been sent. I have asked daughter to call previous provider in [place] to obtain.   Daughter inquiring about aide to help at home when husband has surgery in future. Requesting home health PR/OT/aid.     Review of Systems:  Review of Systems  Constitutional:  Positive for malaise/fatigue and weight loss. Negative for chills and fever.  HENT:  Negative for congestion and sore throat.   Eyes:  Negative for blurred  vision and double vision.       Glasses  Respiratory:  Positive for shortness of breath. Negative for cough and wheezing.   Cardiovascular:  Negative for chest pain and leg swelling.  Gastrointestinal:  Positive for constipation and nausea. Negative for abdominal pain, blood in stool, diarrhea, heartburn and vomiting.  Genitourinary:  Negative for dysuria and frequency.       Nocturia  Musculoskeletal:  Positive for back pain and myalgias. Negative for falls.  Skin:        Hematoma, mole  Neurological:  Positive for weakness. Negative for dizziness and headaches.  Psychiatric/Behavioral:  Positive for depression. Negative for memory loss. The patient has insomnia. The patient is not nervous/anxious.    Past Medical History:  Diagnosis Date   A-fib (HCC)    Anemia    Aortic valve stenosis    Arthritis    Depression    Gall stones    Gallstone    Low BP    Mitral valve stenosis    Past Surgical History:  Procedure Laterality Date   [place]ONOSCOPY     [place]. Normal   [facility]CP  [facility]   FRACTURE SURGERY  2016   Broken Arm   HIP SURGERY  2009   Replacement repaired     MELANOMA EXCISION  2013   right leg   mirtral valve repair  2008  ROTATOR CUFF REPAIR  2016   TOTAL HIP ARTHROPLASTY  2008   VAGINAL HYSTERECTOMY  2005   Social History:   reports that she has never smoked. She has never used smokeless tobacco. She reports that she does not drink alcohol and does not use drugs.  Family History  Problem Relation Age of Onset   COPD Mother    Emphysema Mother    Stroke Father        With complications   Colon cancer Neg Hx    Esophageal cancer Neg Hx    Colon polyps Neg Hx     Medications: Patient's Medications  New Prescriptions   No medications on file  Previous Medications   ACETAMINOPHEN (TYLENOL) 650 MG CR TABLET    Take 650 mg by mouth every 12 (twelve) hours as needed for pain.   AMITRIPTYLINE (ELAVIL) 50 MG TABLET    Take 25  mg by mouth at bedtime.   ASPIRIN EC 81 MG TABLET    Take 81 mg by mouth daily. Monday, Wednesday, and Friday   BUMETANIDE (BUMEX) 1 MG TABLET    Take 1 mg by mouth daily.   DIGOXIN (LANOXIN) 0.125 MG TABLET    Take 1 tablet (0.125 mg total) by mouth daily.   DOCUSATE SODIUM (COLACE) 100 MG CAPSULE    Take 100 mg by mouth as needed for mild constipation.   FERROUS SULFATE 325 (65 FE) MG TABLET    Take 325 mg by mouth 2 (two) times daily with a meal.   METOCLOPRAMIDE (REGLAN) 5 MG TABLET    TAKE 1 TABLET BY MOUTH TWICE A DAY   MIRTAZAPINE (REMERON) 30 MG TABLET    Take 1 tablet (30 mg total) by mouth at bedtime.   MULTIPLE VITAMINS-MINERALS (PRESERVISION AREDS 2+MULTI VIT PO)    Take 1 capsule by mouth at bedtime.   ONDANSETRON (ZOFRAN) 4 MG TABLET    Take 4 mg by mouth 3 (three) times daily as needed for nausea or vomiting.   PANTOPRAZOLE (PROTONIX) 40 MG TABLET    TAKE 1 TABLET BY MOUTH TWICE A DAY   POTASSIUM CHLORIDE SA (KLOR-CON) 20 MEQ TABLET    Take 1 tablet (20 mEq total) by mouth daily.   SENNOSIDES-DOCUSATE SODIUM (SENOKOT-S) 8.6-50 MG TABLET    Take 1 tablet by mouth every other day.   SILDENAFIL (REVATIO) 20 MG TABLET    Take 1 tablet (20 mg total) by mouth 2 (two) times daily.   TRAMADOL (ULTRAM) 50 MG TABLET    Take 50 mg by mouth every 12 (twelve) hours as needed (for pain).   VITAMIN B-12 (CYANOCOBALAMIN) 1000 MCG TABLET    Take 1,000 mcg by mouth daily.   VITAMIN C (ASCORBIC ACID) 500 MG TABLET    Take 1 tablet (500 mg total) by mouth 2 (two) times daily.  Modified Medications   No medications on file  Discontinued Medications   No medications on file    Physical Exam:  There were no vitals filed for this visit. There is no height or weight on file to calculate BMI. Wt Readings from Last 3 Encounters:  02/01/21 76 lb 6.4 oz (34.7 kg)  01/16/21 75 lb 12.8 oz (34.4 kg)  12/15/20 74 lb 8 oz (33.8 kg)    Physical Exam Vitals reviewed.  Constitutional:      General: She  is not in acute distress. HENT:     Head: Normocephalic.  Eyes:     General:  Right eye: No discharge.        Left eye: No discharge.  Cardiovascular:     Rate and Rhythm: Normal rate. Rhythm irregular.     Pulses: Normal pulses.     Heart sounds: Normal heart sounds. No murmur heard. Pulmonary:     Effort: Pulmonary effort is normal. No respiratory distress.     Breath sounds: Normal breath sounds. No wheezing.  Abdominal:     General: Bowel sounds are normal. There is no distension.     Palpations: Abdomen is soft.     Tenderness: There is no abdominal tenderness.  Musculoskeletal:     Cervical back: Normal range of motion.     Right lower leg: No edema.     Left lower leg: No edema.  Lymphadenopathy:     Cervical: No cervical adenopathy.  Skin:    General: Skin is warm and dry.     Capillary Refill: Capillary refill takes less than 2 seconds.       Neurological:     General: No focal deficit present.     Mental Status: She is alert and oriented to person, place, and time.     Gait: Gait abnormal.  Psychiatric:        Mood and Affect: Mood normal.        Behavior: Behavior normal.    Labs reviewed: Basic Metabolic Panel: Recent Labs    10/21/20 0000 11/04/20 1247 01/14/21 1832  NA 136 137 134*  K 4.0 4.4 3.7  CL 97* 93* 96*  CO2 26 24 30   GLUCOSE 164* 120* 103*  BUN 25 29 31*  CREATININE 1.02* 1.07* 1.03*  CALCIUM 8.6 8.9 9.1   Liver Function Tests: Recent Labs    01/14/21 1902  AST 35  ALT 15  ALKPHOS 55  BILITOT 0.7  PROT 7.2  ALBUMIN 3.2*   No results for input(s): LIPASE, AMYLASE in the last 8760 hours. No results for input(s): AMMONIA in the last 8760 hours. CBC: Recent Labs    10/21/20 0000 01/14/21 1832 02/01/21 0000  WBC 6.5 6.0 4.6  NEUTROABS 5,005 4.5 3,524  HGB 10.9* 8.5* 8.1*  HCT 32.2* 26.4* 25.1*  MCV 92.5 107.8* 103.3*  PLT 179 191 162   Lipid Panel: No results for input(s): CHOL, HDL, LDLCALC, TRIG, CHOLHDL,  LDLDIRECT in the last 8760 hours. TSH: No results for input(s): TSH in the last 8760 hours. A1C: No results found for: HGBA1C   Assessment/Plan 1. Shortness of breath - increased sob in last 3 weeks at rest and with exertion - 96% room air today, breathing unlabored  2. Hematoma - granulation tissue present, no sign of infection - recommend keeping open to air a few hours daily - cont to apply triple antibiotic ointment and cover with nonadherent dressing daily - cont Boost to promote healing  3. Low hemoglobin - hgb 8.1 02/01/2021 - cbc/diff  4. Iron deficiency anemia secondary to inadequate dietary iron intake - see above - cont ferrous sulfate   5. Primary insomnia - cont melatonin 5 mg qhs  - recommend listening to audio book, music or sound machine at night to promote sleep - discontinue fluids 2 hours prior to bedtime  6. Slow transit constipation - stable with daily colace and senna QOD  7. Chronic bilateral low back pain without sciatica - stable with prn tylenol  - cont tramadol 50 mg at night  8. Esophageal motility disorder - cont Relgan 5 mg daily  9. Paroxysmal  atrial fibrillation (HCC) - rate controlled with digoxin - cont aspirin M/W/F  10. Underweight due to inadequate caloric intake - down 2 lbs from last visit - reports intermittent nausea - recommend high calorie diet > 2000 calories/day - recommend eating small meals throughout day - cont boost bid  11. Frail elderly - high risk for fall with fracture - cont falls safety measures - home health PT/OT/aid  12. Unstable gait - see above  13. History of skin cancer - raised, nickel sized lesion to left shin, concerned for malignancy - referral to dermatology  Total time: 41 minutes. Greater than 50 % of total time spent doing patient education and medication management on wound care, falls safety plan and high calorie diet.   Next appt: 04/20/2021  Hazle Nordmann, Juel Burrow  [facility] [phone]

## 2021-02-23 NOTE — Patient Instructions (Addendum)
Please obtain immunization and bone density record.   Please promote daily calorie intake- boost and ice cream shakes  Continue PT for pelvic floor training  Audio books or music to help with sleep   [facility] adult group  Dermatologist- Dr. Margo Aye- [phone]

## 2021-02-24 ENCOUNTER — Encounter (HOSPITAL_COMMUNITY): Payer: Self-pay

## 2021-02-24 ENCOUNTER — Other Ambulatory Visit: Payer: Self-pay

## 2021-02-24 ENCOUNTER — Observation Stay (HOSPITAL_COMMUNITY)
Admission: EM | Admit: 2021-02-24 | Discharge: 2021-02-25 | Disposition: A | Discharge status: Home / Self Care | Payer: Medicare Other | Attending: Internal Medicine | Admitting: Internal Medicine

## 2021-02-24 ENCOUNTER — Emergency Department (HOSPITAL_COMMUNITY): Payer: Medicare Other

## 2021-02-24 DIAGNOSIS — R195 Other fecal abnormalities: Secondary | ICD-10-CM | POA: Diagnosis not present

## 2021-02-24 DIAGNOSIS — Z96649 Presence of unspecified artificial hip joint: Secondary | ICD-10-CM | POA: Diagnosis not present

## 2021-02-24 DIAGNOSIS — Z79899 Other long term (current) drug therapy: Secondary | ICD-10-CM | POA: Diagnosis not present

## 2021-02-24 DIAGNOSIS — K922 Gastrointestinal hemorrhage, unspecified: Secondary | ICD-10-CM | POA: Diagnosis present

## 2021-02-24 DIAGNOSIS — D649 Anemia, unspecified: Secondary | ICD-10-CM | POA: Diagnosis not present

## 2021-02-24 DIAGNOSIS — I480 Paroxysmal atrial fibrillation: Secondary | ICD-10-CM | POA: Diagnosis not present

## 2021-02-24 DIAGNOSIS — Z7982 Long term (current) use of aspirin: Secondary | ICD-10-CM | POA: Diagnosis not present

## 2021-02-24 DIAGNOSIS — R5383 Other fatigue: Secondary | ICD-10-CM | POA: Diagnosis present

## 2021-02-24 DIAGNOSIS — Z20822 Contact with and (suspected) exposure to covid-19: Secondary | ICD-10-CM | POA: Diagnosis not present

## 2021-02-24 DIAGNOSIS — D508 Other iron deficiency anemias: Secondary | ICD-10-CM | POA: Diagnosis present

## 2021-02-24 DIAGNOSIS — I5042 Chronic combined systolic (congestive) and diastolic (congestive) heart failure: Secondary | ICD-10-CM | POA: Diagnosis not present

## 2021-02-24 LAB — COMPREHENSIVE METABOLIC PANEL
AG Ratio: 1.1 (calc) (ref 1.0–2.5)
ALT: 11 U/L (ref 6–29)
ALT: 14 U/L (ref 0–44)
AST: 29 U/L (ref 10–35)
AST: 29 U/L (ref 15–41)
Albumin: 3.1 g/dL — ABNORMAL LOW (ref 3.5–5.0)
Albumin: 3.5 g/dL — ABNORMAL LOW (ref 3.6–5.1)
Alkaline Phosphatase: 60 U/L (ref 38–126)
Alkaline phosphatase (APISO): 69 U/L (ref 37–153)
Anion gap: 9 (ref 5–15)
BUN/Creatinine Ratio: 38 (calc) — ABNORMAL HIGH (ref 6–22)
BUN: 40 mg/dL — ABNORMAL HIGH (ref 8–23)
BUN: 41 mg/dL — ABNORMAL HIGH (ref 7–25)
CO2: 32 mmol/L (ref 22–32)
CO2: 33 mmol/L — ABNORMAL HIGH (ref 20–32)
Calcium: 8.9 mg/dL (ref 8.9–10.3)
Calcium: 9.1 mg/dL (ref 8.6–10.4)
Chloride: 95 mmol/L — ABNORMAL LOW (ref 98–111)
Chloride: 97 mmol/L — ABNORMAL LOW (ref 98–110)
Creat: 1.07 mg/dL — ABNORMAL HIGH (ref 0.60–0.95)
Creatinine, Ser: 1.07 mg/dL — ABNORMAL HIGH (ref 0.44–1.00)
GFR, Estimated: 49 mL/min — ABNORMAL LOW (ref >60)
Globulin: 3.3 g/dL (calc) (ref 1.9–3.7)
Glucose, Bld: 130 mg/dL — ABNORMAL HIGH (ref 70–99)
Glucose, Bld: 88 mg/dL (ref 65–99)
Potassium: 3.8 mmol/L (ref 3.5–5.1)
Potassium: 4.1 mmol/L (ref 3.5–5.3)
Sodium: 136 mmol/L (ref 135–145)
Sodium: 138 mmol/L (ref 135–146)
Total Bilirubin: 0.5 mg/dL (ref 0.2–1.2)
Total Bilirubin: 0.8 mg/dL (ref 0.3–1.2)
Total Protein: 6.7 g/dL (ref 6.5–8.1)
Total Protein: 6.8 g/dL (ref 6.1–8.1)

## 2021-02-24 LAB — CBC WITH DIFFERENTIAL/PLATELET
Abs Immature Granulocytes: 0.03 10*3/uL (ref 0.00–0.07)
Absolute Monocytes: 322 cells/uL (ref 200–950)
Basophils Absolute: 0 10*3/uL (ref 0.0–0.1)
Basophils Absolute: 21 cells/uL (ref 0–200)
Basophils Relative: 0 %
Basophils Relative: 0.4 %
Eosinophils Absolute: 0 10*3/uL (ref 0.0–0.5)
Eosinophils Absolute: 10 cells/uL — ABNORMAL LOW (ref 15–500)
Eosinophils Relative: 0 %
Eosinophils Relative: 0.2 %
HCT: 22.1 % — ABNORMAL LOW (ref 36.0–46.0)
HCT: 22.9 % — ABNORMAL LOW (ref 35.0–45.0)
Hemoglobin: 6.9 g/dL — CL (ref 12.0–15.0)
Hemoglobin: 7.3 g/dL — ABNORMAL LOW (ref 11.7–15.5)
Immature Granulocytes: 1 %
Lymphocytes Relative: 10 %
Lymphs Abs: 0.5 10*3/uL — ABNORMAL LOW (ref 0.7–4.0)
Lymphs Abs: 905 cells/uL (ref 850–3900)
MCH: 31.6 pg (ref 27.0–33.0)
MCH: 33.7 pg (ref 26.0–34.0)
MCHC: 31.2 g/dL (ref 30.0–36.0)
MCHC: 31.9 g/dL — ABNORMAL LOW (ref 32.0–36.0)
MCV: 107.8 fL — ABNORMAL HIGH (ref 80.0–100.0)
MCV: 99.1 fL (ref 80.0–100.0)
MPV: 10.6 fL (ref 7.5–12.5)
Monocytes Absolute: 0.3 10*3/uL (ref 0.1–1.0)
Monocytes Relative: 6.2 %
Monocytes Relative: 7 %
Neutro Abs: 3942 cells/uL (ref 1500–7800)
Neutro Abs: 4.3 10*3/uL (ref 1.7–7.7)
Neutrophils Relative %: 75.8 %
Neutrophils Relative %: 82 %
Platelets: 200 10*3/uL (ref 150–400)
Platelets: 236 10*3/uL (ref 140–400)
RBC: 2.05 MIL/uL — ABNORMAL LOW (ref 3.87–5.11)
RBC: 2.31 10*6/uL — ABNORMAL LOW (ref 3.80–5.10)
RDW: 12.3 % (ref 11.0–15.0)
RDW: 14.9 % (ref 11.5–15.5)
Total Lymphocyte: 17.4 %
WBC: 5.2 10*3/uL (ref 3.8–10.8)
WBC: 5.2 10*3/uL (ref 4.0–10.5)
nRBC: 0 % (ref 0.0–0.2)

## 2021-02-24 LAB — TROPONIN I (HIGH SENSITIVITY)
Troponin I (High Sensitivity): 49 ng/L — ABNORMAL HIGH (ref <18)
Troponin I (High Sensitivity): 41 ng/L — ABNORMAL HIGH (ref <18)

## 2021-02-24 LAB — FERRITIN: Ferritin: 103 ng/mL (ref 11–307)

## 2021-02-24 LAB — RESP PANEL BY RT-PCR (FLU A&B, COVID) ARPGX2
Influenza A by PCR: NEGATIVE
Influenza B by PCR: NEGATIVE
SARS Coronavirus 2 by RT PCR: NEGATIVE

## 2021-02-24 LAB — BRAIN NATRIURETIC PEPTIDE: B Natriuretic Peptide: 526.5 pg/mL — ABNORMAL HIGH (ref 0.0–100.0)

## 2021-02-24 LAB — FOLATE: Folate: 46.4 ng/mL (ref >5.9)

## 2021-02-24 LAB — PREPARE RBC (CROSSMATCH)

## 2021-02-24 LAB — IRON AND TIBC
Iron: 45 ug/dL (ref 28–170)
Saturation Ratios: 16 % (ref 10.4–31.8)
TIBC: 283 ug/dL (ref 250–450)
UIBC: 238 ug/dL

## 2021-02-24 LAB — POC OCCULT BLOOD, ED: Fecal Occult Bld: POSITIVE — AB

## 2021-02-24 LAB — VITAMIN B12: Vitamin B-12: 1795 pg/mL — ABNORMAL HIGH (ref 180–914)

## 2021-02-24 MED ORDER — ONDANSETRON HCL 4 MG PO TABS: 4.0000 mg | ORAL_TABLET | Freq: Three times a day (TID) | ORAL | Status: DC | PRN

## 2021-02-24 MED ORDER — BUMETANIDE 1 MG PO TABS
1.0000 mg | ORAL_TABLET | Freq: Every day | ORAL | Status: DC
  Filled 2021-02-24: qty 1

## 2021-02-24 MED ORDER — DOCUSATE SODIUM 100 MG PO CAPS: 100.0000 mg | ORAL_CAPSULE | Freq: Two times a day (BID) | ORAL | Status: DC | PRN

## 2021-02-24 MED ORDER — ASPIRIN EC 81 MG PO TBEC: 81.0000 mg | DELAYED_RELEASE_TABLET | Freq: Every day | ORAL | Status: DC

## 2021-02-24 MED ORDER — METOCLOPRAMIDE HCL 5 MG PO TABS
5.0000 mg | ORAL_TABLET | Freq: Two times a day (BID) | ORAL | Status: DC
  Administered 2021-02-24: 5 mg via ORAL
  Filled 2021-02-24: qty 1

## 2021-02-24 MED ORDER — DIGOXIN 125 MCG PO TABS
0.1250 mg | ORAL_TABLET | Freq: Every day | ORAL | Status: DC
  Filled 2021-02-24: qty 1

## 2021-02-24 MED ORDER — ACETAMINOPHEN 650 MG RE SUPP: 650.0000 mg | Freq: Four times a day (QID) | RECTAL | Status: DC | PRN

## 2021-02-24 MED ORDER — ACETAMINOPHEN 325 MG PO TABS
650.0000 mg | ORAL_TABLET | Freq: Four times a day (QID) | ORAL | Status: DC | PRN
  Administered 2021-02-24: 650 mg via ORAL
  Filled 2021-02-24: qty 2

## 2021-02-24 MED ORDER — VITAMIN B-12 1000 MCG PO TABS
1000.0000 ug | ORAL_TABLET | Freq: Every day | ORAL | Status: DC
  Filled 2021-02-24: qty 1

## 2021-02-24 MED ORDER — AMITRIPTYLINE HCL 25 MG PO TABS
25.0000 mg | ORAL_TABLET | Freq: Every day | ORAL | Status: DC
  Administered 2021-02-24: 25 mg via ORAL
  Filled 2021-02-24: qty 1

## 2021-02-24 MED ORDER — SODIUM CHLORIDE 0.9 % IV SOLN: 10.0000 mL/h | Freq: Once | INTRAVENOUS | Status: DC

## 2021-02-24 MED ORDER — MIRTAZAPINE 15 MG PO TABS
30.0000 mg | ORAL_TABLET | Freq: Every day | ORAL | Status: DC
  Administered 2021-02-24: 30 mg via ORAL
  Filled 2021-02-24: qty 1

## 2021-02-24 MED ORDER — POTASSIUM CHLORIDE CRYS ER 10 MEQ PO TBCR: 20.0000 meq | EXTENDED_RELEASE_TABLET | Freq: Every day | ORAL | Status: DC

## 2021-02-24 MED ORDER — SENNOSIDES-DOCUSATE SODIUM 8.6-50 MG PO TABS: 1.0000 | ORAL_TABLET | ORAL | Status: DC

## 2021-02-24 MED ORDER — PANTOPRAZOLE SODIUM 40 MG IV SOLR
40.0000 mg | Freq: Once | INTRAVENOUS | Status: AC
  Administered 2021-02-24: 40 mg via INTRAVENOUS
  Filled 2021-02-24: qty 40

## 2021-02-24 MED ORDER — SILDENAFIL CITRATE 20 MG PO TABS
20.0000 mg | ORAL_TABLET | Freq: Two times a day (BID) | ORAL | Status: DC
  Administered 2021-02-24: 20 mg via ORAL
  Filled 2021-02-24 (×2): qty 1

## 2021-02-24 MED ORDER — FERROUS SULFATE 325 (65 FE) MG PO TABS: 325.0000 mg | ORAL_TABLET | Freq: Two times a day (BID) | ORAL | Status: DC

## 2021-02-24 MED ORDER — PANTOPRAZOLE SODIUM 40 MG IV SOLR
40.0000 mg | Freq: Two times a day (BID) | INTRAVENOUS | Status: DC
  Administered 2021-02-24: 40 mg via INTRAVENOUS
  Filled 2021-02-24: qty 40

## 2021-02-24 MED ORDER — ASCORBIC ACID 500 MG PO TABS
500.0000 mg | ORAL_TABLET | Freq: Two times a day (BID) | ORAL | Status: DC
  Administered 2021-02-24: 500 mg via ORAL
  Filled 2021-02-24: qty 1

## 2021-02-24 NOTE — ED Notes (Signed)
Jaclyn Berry, [place] is aware of issues w/ cardiac monitor.

## 2021-02-24 NOTE — ED Notes (Signed)
Unable to cardiac monitor at this time d/t equipment malfunction.  Will make provider aware.

## 2021-02-24 NOTE — ED Provider Notes (Signed)
[facility]-EMERGENCY DEPT Provider Note   CSN: [id number] Arrival date & time: 02/24/21  1238     History Chief Complaint  Patient presents with   Abnormal Lab    Jaclyn Berry is a 87 y.o. female with a past medical history significant for paroxysmal atrial fibrillation on ASA 81 mg Monday/Wednesday/Friday, iron deficiency anemia on chronic supplements, depression, and history of mitral valve and aortic valve stenosis status post mitral valve repair who presents to the ED due to worsening fatigue and shortness of breath for the past 3 weeks.  Daughter is at bedside and provided history.  Patient lives with daughter.  Daughter notes that patient has had worsening fatigue over the past few weeks.  Daughter is concerned about her downtrending hemoglobin values.  4 months ago her hemoglobin was 10.9>8.5>8.1>7.3 yesterday.  Daughter/patient denies hematemesis, hematochezia, melena.  Patient denies abdominal pain.  Shortness of breath is worse with exertion.  No history of DVT/PE. No associated chest pain. She has an extensive cardiac history with past mitral valve repair. Her last echocardiogram in 11/2020 demonstrated an ED of 30-35%. Daughter notes her feet swell intermittent. Patient normally ambulates with a walker.  No treatment prior to arrival.  No aggravating or alleviating symptoms.  History obtained from patient and past medical records. No interpreter used during encounter.       Past Medical History:  Diagnosis Date   A-fib (HCC)    Anemia    Aortic valve stenosis    Arthritis    Depression    Gall stones    Gallstone    Low BP    Mitral valve stenosis     Patient Active Problem List   Diagnosis Date Noted   Acute GI bleeding 02/24/2021   Senile purpura (HCC) 12/09/2020   Frail elderly 12/09/2020   Unstable gait 10/25/2020   Paroxysmal atrial fibrillation (HCC) 10/25/2020   Nonrheumatic mitral valve stenosis 10/25/2020   Nonrheumatic aortic valve  stenosis 10/25/2020   Fatigue 10/25/2020   Generalized abdominal pain 10/25/2020   Iron deficiency anemia secondary to inadequate dietary iron intake 10/25/2020   Hypotension 10/25/2020   Chronic combined systolic and diastolic heart failure (HCC) 10/25/2020   Chronic bilateral low back pain without sciatica 10/25/2020   Multiple falls 10/25/2020   Slow transit constipation 10/25/2020   Poor appetite 10/25/2020   Underweight due to inadequate caloric intake 10/25/2020    Past Surgical History:  Procedure Laterality Date   COLONOSCOPY     [place]. Normal   ERCP  09/2019   [facility]   FRACTURE SURGERY  2016   Broken Arm   HIP SURGERY  2009   Replacement repaired     MELANOMA EXCISION  2013   right leg   mirtral valve repair  2008   ROTATOR CUFF REPAIR  2016   TOTAL HIP ARTHROPLASTY  2008   VAGINAL HYSTERECTOMY  2005     OB History   No obstetric history on file.     Family History  Problem Relation Age of Onset   COPD Mother    Emphysema Mother    Stroke Father        With complications   Colon cancer Neg Hx    Esophageal cancer Neg Hx    Colon polyps Neg Hx     Social History   Tobacco Use   Smoking status: Never   Smokeless tobacco: Never  Vaping Use   Vaping Use: Never used  Substance Use  Topics   Alcohol use: Never   Drug use: Never    Home Medications Prior to Admission medications   Medication Sig Start Date End Date Taking? Authorizing Provider  acetaminophen (TYLENOL) 650 MG CR tablet Take 650 mg by mouth every 12 (twelve) hours as needed for pain.   Yes [provider]  amitriptyline (ELAVIL) 50 MG tablet Take 25 mg by mouth at bedtime.   Yes [provider]  aspirin EC 81 MG tablet Take 81 mg by mouth daily. Monday, Wednesday, and Friday   Yes [provider]  bumetanide (BUMEX) 1 MG tablet Take 1 mg by mouth daily.   Yes [provider]  digoxin (LANOXIN) 0.125 MG tablet Take 1 tablet  (0.125 mg total) by mouth daily. 12/26/20  Yes Fargo, Amy E, NP  docusate sodium (COLACE) 100 MG capsule Take 100 mg by mouth as needed for mild constipation.   Yes [provider]  ferrous sulfate 325 (65 FE) MG tablet Take 325 mg by mouth 2 (two) times daily with a meal.   Yes [provider]  metoCLOPramide (REGLAN) 5 MG tablet TAKE 1 TABLET BY MOUTH TWICE A DAY Patient taking differently: Take 5 mg by mouth 2 (two) times daily. 01/23/21  Yes Fargo, Amy E, NP  mirtazapine (REMERON) 30 MG tablet Take 1 tablet (30 mg total) by mouth at bedtime. 12/26/20  Yes Fargo, Amy E, NP  Multiple Vitamins-Minerals (PRESERVISION AREDS 2+MULTI VIT PO) Take 1 capsule by mouth at bedtime.   Yes [provider]  ondansetron (ZOFRAN) 4 MG tablet Take 4 mg by mouth 3 (three) times daily as needed for nausea or vomiting.   Yes [provider]  pantoprazole (PROTONIX) 40 MG tablet TAKE 1 TABLET BY MOUTH TWICE A DAY Patient taking differently: Take 40 mg by mouth 2 (two) times daily. 02/17/21  Yes Fargo, Amy E, NP  potassium chloride SA (KLOR-CON) 20 MEQ tablet Take 1 tablet (20 mEq total) by mouth daily. 12/16/20  Yes Fargo, Amy E, NP  sennosides-docusate sodium (SENOKOT-S) 8.6-50 MG tablet Take 1 tablet by mouth every other day.   Yes [provider]  sildenafil (REVATIO) 20 MG tablet Take 1 tablet (20 mg total) by mouth 2 (two) times daily. 11/11/20  Yes Fargo, Amy E, NP  traMADol (ULTRAM) 50 MG tablet Take 50 mg by mouth every 12 (twelve) hours as needed (for pain).   Yes [provider]  vitamin B-12 (CYANOCOBALAMIN) 1000 MCG tablet Take 1,000 mcg by mouth daily.   Yes [provider]  vitamin C (ASCORBIC ACID) 500 MG tablet Take 1 tablet (500 mg total) by mouth 2 (two) times daily. 01/16/21  Yes Medina-Vargas, Monina C, NP    Allergies    Tape, Morphine and related, and Sulfa antibiotics  Review of Systems   Review of Systems  Constitutional:  Positive for  fatigue. Negative for chills and fever.  Respiratory:  Positive for shortness of breath. Negative for cough.   Cardiovascular:  Positive for leg swelling (feet). Negative for chest pain.  Gastrointestinal:  Negative for abdominal pain, anal bleeding, blood in stool, diarrhea, nausea and vomiting.  All other systems reviewed and are negative.  Physical Exam Updated Vital Signs BP (!) 91/37   Pulse (!) 56   Temp (!) 97.3 F (36.3 C) (Oral)   Resp 16   Ht 5\' 3"  (1.6 m)   Wt 33.7 kg   SpO2 100%   BMI 13.16 kg/m   Physical  Exam Vitals and nursing note reviewed.  Constitutional:      General: She is not in acute distress.    Appearance: She is not ill-appearing.     Comments: Frail, elderly female resting comfortably in bed  HENT:     Head: Normocephalic.  Eyes:     Pupils: Pupils are equal, round, and reactive to light.  Cardiovascular:     Rate and Rhythm: Normal rate and regular rhythm.     Pulses: Normal pulses.     Heart sounds: Normal heart sounds. No murmur heard.   No friction rub. No gallop.  Pulmonary:     Effort: Pulmonary effort is normal.     Breath sounds: Normal breath sounds.  Abdominal:     General: Abdomen is flat. There is no distension.     Palpations: Abdomen is soft.     Tenderness: There is no abdominal tenderness. There is no guarding or rebound.  Musculoskeletal:        General: Normal range of motion.     Cervical back: Neck supple.  Skin:    General: Skin is warm and dry.  Neurological:     General: No focal deficit present.     Mental Status: She is alert.  Psychiatric:        Mood and Affect: Mood normal.        Behavior: Behavior normal.    ED Results / Procedures / Treatments   Labs (all labs ordered are listed, but only abnormal results are displayed) Labs Reviewed  CBC WITH DIFFERENTIAL/PLATELET - Abnormal; Notable for the following components:      Result Value   RBC 2.05 (*)    Hemoglobin 6.9 (*)    HCT 22.1 (*)    MCV 107.8  (*)    Lymphs Abs 0.5 (*)    All other components within normal limits  COMPREHENSIVE METABOLIC PANEL - Abnormal; Notable for the following components:   Chloride 95 (*)    Glucose, Bld 130 (*)    BUN 40 (*)    Creatinine, Ser 1.07 (*)    Albumin 3.1 (*)    GFR, Estimated 49 (*)    All other components within normal limits  BRAIN NATRIURETIC PEPTIDE - Abnormal; Notable for the following components:   B Natriuretic Peptide 526.5 (*)    All other components within normal limits  POC OCCULT BLOOD, ED - Abnormal; Notable for the following components:   Fecal Occult Bld POSITIVE (*)    All other components within normal limits  TROPONIN I (HIGH SENSITIVITY) - Abnormal; Notable for the following components:   Troponin I (High Sensitivity) 49 (*)    All other components within normal limits  RESP PANEL BY RT-PCR (FLU A&B, COVID) ARPGX2  VITAMIN B12  FOLATE  IRON AND TIBC  FERRITIN  RETICULOCYTES  TYPE AND SCREEN  PREPARE RBC (CROSSMATCH)  TROPONIN I (HIGH SENSITIVITY)    EKG None  Radiology DG Chest Portable 1 View  Result Date: 02/24/2021 CLINICAL DATA:  Short of breath. EXAM: PORTABLE CHEST 1 VIEW COMPARISON:  None. FINDINGS: Previous median sternotomy and CABG procedure. Mild cardiac enlargement. Lungs are hyperinflated but clear. No pleural effusion or edema. Calcified granuloma identified in the left lower lobe. IMPRESSION: No acute cardiopulmonary abnormalities. Electronically Signed   By: Signa Kell M.D.   On: 02/24/2021 14:03    Procedures .Critical Care  Date/Time: 02/24/2021 3:54 PM Performed by: Mannie Stabile, PA-C Authorized by: Mannie Stabile, PA-C  Critical care provider statement:    Critical care time (minutes):  40   Critical care time was exclusive of:  Separately billable procedures and treating other patients and teaching time   Critical care was necessary to treat or prevent imminent or life-threatening deterioration of the following  conditions:  Circulatory failure, dehydration and metabolic crisis   Critical care was time spent personally by me on the following activities:  Discussions with consultants, evaluation of patient's response to treatment, examination of patient, ordering and performing treatments and interventions, ordering and review of laboratory studies, ordering and review of radiographic studies, pulse oximetry, re-evaluation of patient's condition, obtaining history from patient or surrogate and review of old charts   I assumed direction of critical care for this patient from another provider in my specialty: no     Care discussed with: admitting provider     Medications Ordered in ED Medications  0.9 %  sodium chloride infusion (has no administration in time range)  pantoprazole (PROTONIX) injection 40 mg (40 mg Intravenous Not Given 02/24/21 1552)  pantoprazole (PROTONIX) injection 40 mg (40 mg Intravenous Given 02/24/21 1524)    ED Course  I have reviewed the triage vital signs and the nursing notes.  Pertinent labs & imaging results that were available during my care of the patient were reviewed by me and considered in my medical decision making (see chart for details).  Clinical Course as of 02/24/21 1556  Fri Feb 24, 2021  1445 Hemoglobin(!!): 6.9 [CA]  1458 Fecal Occult Blood, POC(!): POSITIVE [CA]  1503 pr [CA]    Clinical Course User Index [CA] Mannie Stabile, PA-C   MDM Rules/Calculators/A&P                         87 year old female presents to the ED due to worsening fatigue and shortness of breath for the past 3 weeks.  Daughter is at bedside and provided history.  Daughter is concerned about downtrending hemoglobin over the past few months.  Hemoglobin yesterday was 7.3 at PCP office.  No hematemesis, hematochezia, or melena. Upon arrival, patient mildly hypotensive. Baseline blood pressures appear soft. Patient not tachycardic or hypoxic, on room air. Labs and CXR. EKG and troponin  to rule out cardiac etiology. Fatigue and SOB could be related to symptomatic anemia. BNP to rule out CHF exacerbation even though patient does not appear fluid overload on exam. Discussed case with Dr. Freida Busman who evaluated patient at bedside and agrees with assessment and plan.   Rectal exam performed with chaperone in room which demonstrated melanotic stool.  No hemorrhoids or visible fissures.  CBC significant for anemia with hemoglobin at 6.9.  Patient agreeable to blood transfusion.  Blood ordered.  Fecal occult positive.  IV protonix started. Troponin elevated at 49.  Will obtain delta to rule out ACS.  CMP significant for elevated creatinine 1.07 and BUN at 40.  Chest x-ray personally reviewed which is negative for signs of pneumonia, pneumothorax, or widened mediastinum.   Discussed case with Amy with GI who will assess patient at bedside. Discussed with Dr. Toniann Fail with [facility] who agrees to admit patient for further treatment. COVID test ordered. Final Clinical Impression(s) / ED Diagnoses Final diagnoses:  Symptomatic anemia    Rx / DC Orders ED Discharge Orders     None        Jesusita Oka 02/24/21 1556    Lorre Nick, MD 02/25/21 [phone]

## 2021-02-24 NOTE — ED Notes (Signed)
Jaclyn Berry, [place] and Dr. Freida Busman aware of low BP.

## 2021-02-24 NOTE — H&P (Signed)
History and Physical    Jaclyn Berry GMW:[id number] DOB: 11/02/1926 DOA: 02/24/2021  PCP: [name], NP  Patient coming from: Home.  Chief Complaint: Low hemoglobin.  HPI: Jaclyn Berry is a 87 y.o. female with history of chronic systolic heart failure with a severe aortic stenosis atrial fibrillation chronic anemia dysphagia was advised to come to the ER after patient's recent blood work showed hemoglobin had dropped from 8.1-7.3 over the period of 3 weeks.  In addition patient also has been getting more weaker and exertional shortness of breath.  Patient has dark stools chronically and has been taking iron pills.  ED Course: In the ER patient's hemoglobin was rechecked it was 6.9 dropped from 7.3 yesterday.  Chest x-ray unremarkable.  ER physician consulted on-call gastroenterologist.  At this time patient and patient's daughter was at the bedside not planning any GI endoscopic evaluation.  But would like to get blood transfusion.  COVID test is negative.  Review of Systems: As per HPI, rest all negative.   Past Medical History:  Diagnosis Date   A-fib (HCC)    Anemia    Aortic valve stenosis    Arthritis    Depression    Gall stones    Gallstone    Low BP    Mitral valve stenosis     Past Surgical History:  Procedure Laterality Date   COLONOSCOPY     [place]. Normal   [facility]CP  09/[facility]   [facility]   FRACTURE SURGERY  2016   Broken Arm   HIP SURGERY  2009   Replacement repaired     MELANOMA EXCISION  2013   right leg   mirtral valve repair  2008   ROTATOR CUFF REPAIR  2016   TOTAL HIP ARTHROPLASTY  2008   VAGINAL HYSTERECTOMY  2005     reports that she has never smoked. She has never used smokeless tobacco. She reports that she does not drink alcohol and does not use drugs.  Allergies  Allergen Reactions   Tape Other (See Comments)    SKIN TEARS VERY EASILY!!!!   Morphine And Related Nausea And Vomiting   Sulfa Antibiotics Rash     Family History  Problem Relation Age of Onset   COPD Mother    Emphysema Mother    Stroke Father        With complications   Colon cancer Neg Hx    Esophageal cancer Neg Hx    Colon polyps Neg Hx     Prior to Admission medications   Medication Sig Start Date End Date Taking? Authorizing Provider  acetaminophen (TYLENOL) 650 MG CR tablet Take 650 mg by mouth every 12 (twelve) hours as needed for pain.   Yes [provider]  amitriptyline (ELAVIL) 50 MG tablet Take 25 mg by mouth at bedtime.   Yes [provider]  aspirin EC 81 MG tablet Take 81 mg by mouth daily. Monday, Wednesday, and Friday   Yes [provider]  bumetanide (BUMEX) 1 MG tablet Take 1 mg by mouth daily.   Yes [provider]  digoxin (LANOXIN) 0.125 MG tablet Take 1 tablet (0.125 mg total) by mouth daily. 12/26/20  Yes Fargo, Amy E, NP  docusate sodium (COLACE) 100 MG capsule Take 100 mg by mouth as needed for mild constipation.   Yes [provider]  ferrous sulfate 325 (65 FE) MG tablet Take 325 mg by mouth 2 (two) times daily with a meal.  Yes [provider]  metoCLOPramide (REGLAN) 5 MG tablet TAKE 1 TABLET BY MOUTH TWICE A DAY Patient taking differently: Take 5 mg by mouth 2 (two) times daily. 01/23/21  Yes Fargo, Amy E, NP  mirtazapine (REMERON) 30 MG tablet Take 1 tablet (30 mg total) by mouth at bedtime. 12/26/20  Yes Fargo, Amy E, NP  Multiple Vitamins-Minerals (PRESERVISION AREDS 2+MULTI VIT PO) Take 1 capsule by mouth at bedtime.   Yes [provider]  ondansetron (ZOFRAN) 4 MG tablet Take 4 mg by mouth 3 (three) times daily as needed for nausea or vomiting.   Yes [provider]  pantoprazole (PROTONIX) 40 MG tablet TAKE 1 TABLET BY MOUTH TWICE A DAY Patient taking differently: Take 40 mg by mouth 2 (two) times daily. 02/17/21  Yes Fargo, Amy E, NP  potassium chloride SA (KLOR-CON) 20 MEQ tablet Take 1 tablet (20 mEq total) by mouth  daily. 12/16/20  Yes Fargo, Amy E, NP  sennosides-docusate sodium (SENOKOT-S) 8.6-50 MG tablet Take 1 tablet by mouth every other day.   Yes [provider]  sildenafil (REVATIO) 20 MG tablet Take 1 tablet (20 mg total) by mouth 2 (two) times daily. 11/11/20  Yes Fargo, Amy E, NP  traMADol (ULTRAM) 50 MG tablet Take 50 mg by mouth every 12 (twelve) hours as needed (for pain).   Yes [provider]  vitamin B-12 (CYANOCOBALAMIN) 1000 MCG tablet Take 1,000 mcg by mouth daily.   Yes [provider]  vitamin C (ASCORBIC ACID) 500 MG tablet Take 1 tablet (500 mg total) by mouth 2 (two) times daily. 01/16/21  Yes Medina-Vargas, Monina C, NP    Physical Exam: Constitutional: Moderately built and nourished. Vitals:   02/24/21 1330 02/24/21 1438 02/24/21 1530 02/24/21 1600  BP: (!) 90/48 (!) 82/58 (!) 91/37 (!) 87/48  Pulse: 65 89 (!) 56 (!) 57  Resp: 16 16    Temp:      TempSrc:      SpO2: 100% 100% 100% 100%  Weight:      Height:       Eyes: Anicteric mild pallor. ENMT: No discharge from the ears eyes nose and mouth. Neck: No mass felt.  No neck rigidity.  No JVD appreciated. Respiratory: No rhonchi or crepitations. Cardiovascular: S1-S2 heard. Abdomen: Soft nontender bowel sounds present. Musculoskeletal: No edema. Skin: Chronic skin changes in lower extremity. Neurologic: Alert awake oriented to time place and person moving all extremities. Psychiatric: Appears normal.  Normal affect.   Labs on Admission: I have personally reviewed following labs and imaging studies  CBC: Recent Labs  Lab 02/23/21 0000 02/24/21 1407  WBC 5.2 5.2  NEUTROABS 3,942 4.3  HGB 7.3* 6.9*  HCT 22.9* 22.1*  MCV 99.1 107.8*  PLT 236 200   Basic Metabolic Panel: Recent Labs  Lab 02/23/21 0000 02/24/21 1407  NA 138 136  K 4.1 3.8  CL 97* 95*  CO2 33* 32  GLUCOSE 88 130*  BUN 41* 40*  CREATININE 1.07* 1.07*  CALCIUM 9.1 8.9   GFR: Estimated Creatinine Clearance: 17.8  mL/min (A) (by C-G formula based on SCr of 1.07 mg/dL (H)). Liver Function Tests: Recent Labs  Lab 02/23/21 0000 02/24/21 1407  AST 29 29  ALT 11 14  ALKPHOS  --  60  BILITOT 0.5 0.8  PROT 6.8 6.7  ALBUMIN  --  3.1*   No results for input(s): LIPASE, AMYLASE in the last 168 hours. No results for input(s): AMMONIA in the  last 168 hours. Coagulation Profile: No results for input(s): INR, PROTIME in the last 168 hours. Cardiac Enzymes: No results for input(s): CKTOTAL, CKMB, CKMBINDEX, TROPONINI in the last 168 hours. BNP (last 3 results) Recent Labs    11/04/20 1247  PROBNP 6,807*   HbA1C: No results for input(s): HGBA1C in the last 72 hours. CBG: No results for input(s): GLUCAP in the last 168 hours. Lipid Profile: No results for input(s): CHOL, HDL, LDLCALC, TRIG, CHOLHDL, LDLDIRECT in the last 72 hours. Thyroid Function Tests: No results for input(s): TSH, T4TOTAL, FREET4, T3FREE, THYROIDAB in the last 72 hours. Anemia Panel: No results for input(s): VITAMINB12, FOLATE, FERRITIN, TIBC, IRON, RETICCTPCT in the last 72 hours. Urine analysis: No results found for: COLORURINE, APPEARANCEUR, LABSPEC, PHURINE, GLUCOSEU, HGBUR, BILIRUBINUR, KETONESUR, PROTEINUR, UROBILINOGEN, NITRITE, LEUKOCYTESUR Sepsis Labs: @LABRCNTIP (procalcitonin:4,lacticidven:4) ) Recent Results (from the past 240 hour(s))  Resp Panel by RT-PCR (Flu A&B, Covid) Nasopharyngeal Swab     Status: None   Collection Time: 02/24/21  3:04 PM   Specimen: Nasopharyngeal Swab; Nasopharyngeal(NP) swabs in vial transport medium  Result Value Ref Range Status   SARS Coronavirus 2 by RT PCR NEGATIVE NEGATIVE Final    Comment: (NOTE) SARS-CoV-2 target nucleic acids are NOT DETECTED.  The SARS-CoV-2 RNA is generally detectable in upper respiratory specimens during the acute phase of infection. The lowest concentration of SARS-CoV-2 viral copies this assay can detect is 138 copies/mL. A negative result does not  preclude SARS-Cov-2 infection and should not be used as the sole basis for treatment or other patient management decisions. A negative result may occur with  improper specimen collection/handling, submission of specimen other than nasopharyngeal swab, presence of viral mutation(s) within the areas targeted by this assay, and inadequate number of viral copies(<138 copies/mL). A negative result must be combined with clinical observations, patient history, and epidemiological information. The expected result is Negative.  Fact Sheet for Patients:  02/26/21  Fact Sheet for Healthcare Providers:  BloggerCourse.com  This test is no t yet approved or cleared by the [employer] FDA and  has been authorized for detection and/or diagnosis of SARS-CoV-2 by FDA under an Emergency Use Authorization (EUA). This EUA will remain  in effect (meaning this test can be used) for the duration of the COVID-19 declaration under Section 564(b)(1) of the Act, 21 U.S.C.section 360bbb-3(b)(1), unless the authorization is terminated  or revoked sooner.       Influenza A by PCR NEGATIVE NEGATIVE Final   Influenza B by PCR NEGATIVE NEGATIVE Final    Comment: (NOTE) The Xpert Xpress SARS-CoV-2/FLU/RSV plus assay is intended as an aid in the diagnosis of influenza from Nasopharyngeal swab specimens and should not be used as a sole basis for treatment. Nasal washings and aspirates are unacceptable for Xpert Xpress SARS-CoV-2/FLU/RSV testing.  Fact Sheet for Patients: [place]  Fact Sheet for Healthcare Providers: BloggerCourse.com  This test is not yet approved or cleared by the [employer] FDA and has been authorized for detection and/or diagnosis of SARS-CoV-2 by FDA under an Emergency Use Authorization (EUA). This EUA will remain in effect (meaning this test can be used) for the  duration of the COVID-19 declaration under Section 564(b)(1) of the Act, 21 U.S.C. section 360bbb-3(b)(1), unless the authorization is terminated or revoked.  Performed at [facility], [address] [place], [place]      Radiological Exams on Admission: DG Chest Portable 1 View  Result Date: 02/24/2021 CLINICAL DATA:  Short of breath. EXAM:  PORTABLE CHEST 1 VIEW COMPARISON:  None. FINDINGS: Previous median sternotomy and CABG procedure. Mild cardiac enlargement. Lungs are hyperinflated but clear. No pleural effusion or edema. Calcified granuloma identified in the left lower lobe. IMPRESSION: No acute cardiopulmonary abnormalities. Electronically Signed   By: Signa Kell M.D.   On: 02/24/2021 14:03    EKG: Independently reviewed.  Possible A. fib with slow response.  Assessment/Plan Principal Problem:   Symptomatic anemia Active Problems:   Paroxysmal atrial fibrillation (HCC)   Iron deficiency anemia secondary to inadequate dietary iron intake   Chronic combined systolic and diastolic heart failure (HCC)   Acute GI bleeding    Symptomatic anemia with possible GI bleed for which at this time family is okay with blood transfusion but not keen on getting any endoscopy evaluation.  Appreciate GI consult.  Protonix has been ordered 1 unit of PRBC has been ordered.  Closely follow respiratory status given the severe aortic stenosis and CHF.  Patient does have a history of chronic anemia for which patient is receiving iron supplements and also B12 supplements.  Gastroenterologist has ordered B12 levels and iron levels.  Based on which we will have further plans for supplementation. Chronic systolic heart and diastolic failure with severe aortic stenosis last EF measured in Jan 04, 2021 was 30 to 35% with grade 2 diastolic dysfunction.  Patient is receiving blood transfusion closely for respiratory status patient is on Bumex. Paroxysmal atrial fibrillation on  aspirin 3 times a week.  Takes digoxin.    DVT prophylaxis: SCDs.  Patient is receiving PRBC.  With stool for occult blood we are monitoring hemoglobin to rise appropriately after transfusion so holding anticoagulation follow-up. Code Status: DNR.  Confirmed with patient's daughter. Family Communication: Patient's daughter. Disposition Plan: Home when stable. Consults called: Gastroenterologist. Admission status: Observation.   Eduard Clos MD [facility] Pager [phone].  If 7PM-7AM, please contact night-coverage [email or web address] Password [facility]  02/24/2021, 5:23 PM

## 2021-02-24 NOTE — Consult Note (Addendum)
Consultation  Referring Provider: [facility]/ Freida Busman Primary Care Physician:  [name], NP Primary Gastroenterologist:  Candelaria Stagers  Reason for Consultation:  anemia/ heme positive stool  HPI: Jaclyn Berry is a 87 y.o. female, who was recently seen in our office when referred for dysphagia in May 2022.  At that time she had complained of some of progressive dysphagia over the past 8 or 9 months with episodes of regurgitation.  Her daughter had related that she has had some gradual weight loss over the past couple of years but had lost a significant amount of weight between September 2021 and now.  At the time she was seen in the office she was 74 pounds. She did undergo barium swallow that was a limited single contrast exam with significant esophageal dysmotility and extensive tertiary contractions, read as structurally normal esophagus, no hiatal hernia Her daughter says that she has been doing better with eating though she still eats very small amounts and they have been trying to get to boost in her per day.  She has been on Reglan AC which they feel has been helping. Patient also has history of congestive heart failure with EF most recently documented at 30% and severe aortic valve stenosis, history of atrial fibrillation, she is status post cholecystectomy and had ERCP with stone extraction while living in [place] in February 2021. She has history of chronic anemia dating back several years and by report this has been both iron and B12 deficiency.  She is on oral B12 and iron supplementation. Patient's daughter relates that while she was still living in [place] this past winter she had a drift in hemoglobin and required a transfusion in November and then again in December. They were directed to come to the emergency room today because labs yesterday as an outpatient showed drop in hemoglobin from 8.5-7.3 over the past month. Hemoglobin 01/14/2021 was 8.5 hematocrit of 26.4 and MCV of 107  INR 1.0 Hemoglobin March 2000 2210.9/MCV 92  Today hemoglobin 6.9 hematocrit of 22.1 MCV 107 platelets 200 BUN 40/creatinine 1.07 Troponin 49 BNP pending Stool was checked and documented heme positive.  Patient's daughter says that her stools are always dark on iron she has not noticed any changes, and no obvious melena or hematochezia.  She has not had any changes in bowel habits.  Patient denies any abdominal pain, no nausea or vomiting.  No current heartburn or indigestion, chronic dysphagia as above.  They have noticed decrease in her energy level over the past few weeks and some increased dyspnea with exertion and with rest.  Patient had a colonoscopy 20+ years ago, no prior EGD.  Family is not certain that they would want to put her through endoscopic evaluation. On further discussion with the patient and family in her room today, decision made not to pursue endoscopic evaluation.  Patient also declines to have CT imaging at this time.  She says she would like to get blood transfusions and go home   Past Medical History:  Diagnosis Date   A-fib (HCC)    Anemia    Aortic valve stenosis    Arthritis    Depression    Gall stones    Gallstone    Low BP    Mitral valve stenosis     Past Surgical History:  Procedure Laterality Date   COLONOSCOPY     [place]. Normal   [facility]CP  09/[facility]   [facility]   FRACTURE SURGERY  2016  Broken Arm   HIP SURGERY  2009   Replacement repaired     MELANOMA EXCISION  2013   right leg   mirtral valve repair  2008   ROTATOR CUFF REPAIR  2016   TOTAL HIP ARTHROPLASTY  2008   VAGINAL HYSTERECTOMY  2005    Prior to Admission medications   Medication Sig Start Date End Date Taking? Authorizing Provider  acetaminophen (TYLENOL) 650 MG CR tablet Take 650 mg by mouth every 12 (twelve) hours as needed for pain.   Yes [provider]  amitriptyline (ELAVIL) 50 MG tablet Take 25 mg by mouth at bedtime.   Yes [provider]  aspirin EC 81 MG tablet Take 81 mg by mouth daily. Monday, Wednesday, and Friday   Yes [provider]  bumetanide (BUMEX) 1 MG tablet Take 1 mg by mouth daily.   Yes [provider]  digoxin (LANOXIN) 0.125 MG tablet Take 1 tablet (0.125 mg total) by mouth daily. 12/26/20  Yes Fargo, Amy E, NP  docusate sodium (COLACE) 100 MG capsule Take 100 mg by mouth as needed for mild constipation.   Yes [provider]  ferrous sulfate 325 (65 FE) MG tablet Take 325 mg by mouth 2 (two) times daily with a meal.   Yes [provider]  metoCLOPramide (REGLAN) 5 MG tablet TAKE 1 TABLET BY MOUTH TWICE A DAY Patient taking differently: Take 5 mg by mouth 2 (two) times daily. 01/23/21  Yes Fargo, Amy E, NP  mirtazapine (REMERON) 30 MG tablet Take 1 tablet (30 mg total) by mouth at bedtime. 12/26/20  Yes Fargo, Amy E, NP  Multiple Vitamins-Minerals (PRESERVISION AREDS 2+MULTI VIT PO) Take 1 capsule by mouth at bedtime.   Yes [provider]  ondansetron (ZOFRAN) 4 MG tablet Take 4 mg by mouth 3 (three) times daily as needed for nausea or vomiting.   Yes [provider]  pantoprazole (PROTONIX) 40 MG tablet TAKE 1 TABLET BY MOUTH TWICE A DAY Patient taking differently: Take 40 mg by mouth 2 (two) times daily. 02/17/21  Yes Fargo, Amy E, NP  potassium chloride SA (KLOR-CON) 20 MEQ tablet Take 1 tablet (20 mEq total) by mouth daily. 12/16/20  Yes Fargo, Amy E, NP  sennosides-docusate sodium (SENOKOT-S) 8.6-50 MG tablet Take 1 tablet by mouth every other day.   Yes [provider]  sildenafil (REVATIO) 20 MG tablet Take 1 tablet (20 mg total) by mouth 2 (two) times daily. 11/11/20  Yes Fargo, Amy E, NP  traMADol (ULTRAM) 50 MG tablet Take 50 mg by mouth every 12 (twelve) hours as needed (for pain).   Yes [provider]  vitamin B-12 (CYANOCOBALAMIN) 1000 MCG tablet Take 1,000 mcg by mouth daily.   Yes [provider]  vitamin C  (ASCORBIC ACID) 500 MG tablet Take 1 tablet (500 mg total) by mouth 2 (two) times daily. 01/16/21  Yes Medina-Vargas, Monina C, NP    Current Facility-Administered Medications  Medication Dose Route Frequency Provider Last Rate Last Admin   0.9 %  sodium chloride infusion  10 mL/hr Intravenous Once Mannie Stabile, PA-C       Current Outpatient Medications  Medication Sig Dispense Refill   acetaminophen (TYLENOL) 650 MG CR tablet Take 650 mg by mouth every 12 (twelve) hours as needed for pain.     amitriptyline (ELAVIL) 50 MG tablet Take 25 mg by mouth at bedtime.     aspirin EC 81 MG tablet Take 81  mg by mouth daily. Monday, Wednesday, and Friday     bumetanide (BUMEX) 1 MG tablet Take 1 mg by mouth daily.     digoxin (LANOXIN) 0.125 MG tablet Take 1 tablet (0.125 mg total) by mouth daily. 90 tablet 1   docusate sodium (COLACE) 100 MG capsule Take 100 mg by mouth as needed for mild constipation.     ferrous sulfate 325 (65 FE) MG tablet Take 325 mg by mouth 2 (two) times daily with a meal.     metoCLOPramide (REGLAN) 5 MG tablet TAKE 1 TABLET BY MOUTH TWICE A DAY (Patient taking differently: Take 5 mg by mouth 2 (two) times daily.) 60 tablet 6   mirtazapine (REMERON) 30 MG tablet Take 1 tablet (30 mg total) by mouth at bedtime. 90 tablet 1   Multiple Vitamins-Minerals (PRESERVISION AREDS 2+MULTI VIT PO) Take 1 capsule by mouth at bedtime.     ondansetron (ZOFRAN) 4 MG tablet Take 4 mg by mouth 3 (three) times daily as needed for nausea or vomiting.     pantoprazole (PROTONIX) 40 MG tablet TAKE 1 TABLET BY MOUTH TWICE A DAY (Patient taking differently: Take 40 mg by mouth 2 (two) times daily.) 60 tablet 3   potassium chloride SA (KLOR-CON) 20 MEQ tablet Take 1 tablet (20 mEq total) by mouth daily. 30 tablet 5   sennosides-docusate sodium (SENOKOT-S) 8.6-50 MG tablet Take 1 tablet by mouth every other day.     sildenafil (REVATIO) 20 MG tablet Take 1 tablet (20 mg total) by mouth 2 (two)  times daily. 180 tablet 1   traMADol (ULTRAM) 50 MG tablet Take 50 mg by mouth every 12 (twelve) hours as needed (for pain).     vitamin B-12 (CYANOCOBALAMIN) 1000 MCG tablet Take 1,000 mcg by mouth daily.     vitamin C (ASCORBIC ACID) 500 MG tablet Take 1 tablet (500 mg total) by mouth 2 (two) times daily. 60 tablet 0    Allergies as of 02/24/2021 - Review Complete 02/24/2021  Allergen Reaction Noted   Tape Other (See Comments) 01/14/2021   Morphine and related Nausea And Vomiting 10/21/2020   Sulfa antibiotics Rash 10/21/2020    Family History  Problem Relation Age of Onset   COPD Mother    Emphysema Mother    Stroke Father        With complications   Colon cancer Neg Hx    Esophageal cancer Neg Hx    Colon polyps Neg Hx     Social History   Socioeconomic History   Marital status: Widowed    Spouse name: Not on file   Number of children: Not on file   Years of education: Not on file   Highest education level: Not on file  Occupational History   Not on file  Tobacco Use   Smoking status: Never   Smokeless tobacco: Never  Vaping Use   Vaping Use: Never used  Substance and Sexual Activity   Alcohol use: Never   Drug use: Never   Sexual activity: Not Currently  Other Topics Concern   Not on file  Social History Narrative   Diet: Regular      Do you drink/ eat things with caffeine? No always drank coffee but not now      Marital status:  Widowed                              What year were you  married ? 1954      Do you live in a house, apartment,assistred living, condo, trailer, etc.)? House      Is it one or more stories? 2      How many persons live in your home ? 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  No                            Type & how often ------------      ADVANCED  DIRECTIVES (Please bring copies)      Do you have a living will? Yes      Do you have a DNR form? Yes                      If not, do you want to discuss one?       Do you have signed POA?HPOA forms?   Yes              If so, please bring to your appointment      FUNCTIONAL STATUS- To be completed by Spouse / child / Staff       Do you have difficulty bathing or dressing yourself ? Yes      Do you have difficulty preparing food or eating ? Yes      Do you have difficulty managing your mediation ? Yes      Do you have difficulty managing your finances ? Yes      Do you have difficulty affording your medication ? No      Social Determinants of Corporate investment bankerHealth   Financial Resource Strain: Not on file  Food Insecurity: Not on file  Transportation Needs: Not on file  Physical Activity: Not on file  Stress: Not on file  Social Connections: Not on file  Intimate Partner Violence: Not on file    Review of Systems: Pertinent positive and negative review of systems were noted in the above HPI section.  All other review of systems was otherwise negative.   Physical Exam: Vital signs in last 24 hours: Temp:  [97.3 F (36.3 C)] 97.3 F (36.3 C) (07/15 1259) Pulse Rate:  [65-89] 89 (07/15 1438) Resp:  [16] 16 (07/15 1438) BP: (82-105)/(48-58) 82/58 (07/15 1438) SpO2:  [97 %-100 %] 100 % (07/15 1438) Weight:  [33.7 kg] 33.7 kg (07/15 1256)   General:   Alert,  Well-developed, extremely frail appearing very thin elderly white female, pleasant and cooperative in NAD, pale, weight 70 pounds Head:  Normocephalic and atraumatic, temporal wasting Eyes:  Sclera clear, no icterus.   Conjunctiva pale Ears:  Normal auditory acuity. Nose:  No deformity, discharge,  or lesions. Mouth:  No deformity or lesions.   Neck:  Supple; no masses or thyromegaly. Lungs:  Clear throughout to auscultation.   No wheezes, crackles, or rhonchi.  Heart:  Regular rate and rhythm; harsh systolic murmur Abdomen:   Soft, scaphoid, nontender BS active,nonpalp mass or hsm.   Rectal: Not done, documented heme positive per ER Msk:  Symmetrical without gross deformities. . Pulses:  Normal pulses noted. Extremities:  Without clubbing or edema. Neurologic:  Alert and  oriented x4;  grossly  normal neurologically. Skin: Multiple ecchymoses over the arms and legs Psych:  Alert and cooperative. Normal mood and affect.  Intake/Output from previous day: No intake/output data recorded. Intake/Output this shift: No intake/output data recorded.  Lab Results: Recent Labs    02/23/21 0000 02/24/21 1407  WBC 5.2 5.2  HGB 7.3* 6.9*  HCT 22.9* 22.1*  PLT 236 200   BMET Recent Labs    02/23/21 0000 02/24/21 1407  NA 138 136  K 4.1 3.8  CL 97* 95*  CO2 33* 32  GLUCOSE 88 130*  BUN 41* 40*  CREATININE 1.07* 1.07*  CALCIUM 9.1 8.9   LFT Recent Labs    02/24/21 1407  PROT 6.7  ALBUMIN 3.1*  AST 29  ALT 14  ALKPHOS 60  BILITOT 0.8   PT/INR No results for input(s): LABPROT, INR in the last 72 hours. Hepatitis Panel No results for input(s): HEPBSAG, HCVAB, HEPAIGM, HEPBIGM in the last 72 hours.    IMPRESSION:  #78 87 year old white female with severe aortic stenosis and congestive heart failure with EF 30% #2 chronic anemia with recent worsening and 1.5 g drop in hemoglobin over the past 6 weeks Patient has previously documented iron deficiency and B12 deficiency, has been on oral supplementation, no recent labs Stool documented heme positive today but no evidence for overt bleeding It is not clear that she is having any significant GI blood loss, this may be multifactoral with combination of B12 deficiency, iron deficiency and or myelodysplastic process  We discussed endoscopic evaluation today, she is extremely frail and do not feel that she would tolerate colon prep or colonoscopy and is high risk for complications with sedation and endoscopy.. Patient has decided that she does not want to  have any endoscopic evaluation We also discussed CT imaging as a means to do some work-up to rule out malignancy.  Patient does not wish to have CT imaging at this time  #3 significant weight loss over the past year and a half-probably multifactorial, she has had significant dysphagia with esophageal dysmotility on recent barium swallow  #4 history of choledocholithiasis status post ERCP and stone extraction 2021/[place]  Plan; Ordered anemia panel to assess B12 folate and iron levels.  She may benefit from IV iron and/or B12 injections if B12 low, and also consideration of hematology consultation which could be done outpatient. No endoscopic evaluation planned Transfuse 2 units packed RBCs Diet as tolerated and continue protein supplements/boost 2-3 times daily Would anticipate discharge home in a.m. GI will sign off, available for problems or questions   Amy Esterwood PA-C 02/24/2021, 3:27 PM     [facility] Attending   I have taken an interval history, reviewed the chart and examined the patient. I agree with the Advanced Practitioner's note, impression and recommendations.  Majority the medical decision-making in the formulation of the assessment and plan were performed by me.    This poor lady is skin and bones for reasons not entirely clear, but she is very emaciated and really wants to get only the treatment she absolutely needs.  Her daughter is present and participates in this planning.  I am suspicious of a possible bone marrow process, she could have some hemolysis related to severe aortic stenosis contributing to her anemia.  I would not recommend any endoscopy even if she was willing to do so.   Hopefully she will get some benefit from the transfusions.    Iva Boop, MD, [facility]  Gastroenterology 02/24/2021 5:49 PM

## 2021-02-24 NOTE — ED Notes (Signed)
Attempted to do EKG but it is not reading.  Will reattempt.

## 2021-02-24 NOTE — ED Provider Notes (Signed)
I provided a substantive portion of the care of this patient.  I personally performed the entirety of the medical decision making for this encounter.      87 year old female presents with weakness and low hemoglobin..  Patient with longstanding history anemia.  She does have guaiac positive stools here.  Blood transfusion ordered and will consult GI and patient to be admitted   Lorre Nick, MD 02/24/21 1520

## 2021-02-24 NOTE — ED Triage Notes (Signed)
Pt  daughter reports hemoglobin has been decreasing since march. Pt has become more lethargic over the past couple of days.

## 2021-02-24 NOTE — ED Triage Notes (Signed)
Pts daughter reports slowing decreasing hemoglobin over the past month. Hemoglobin was 7.3 yesterday. Pt has increased fatigue and SHOB.

## 2021-02-25 DIAGNOSIS — D649 Anemia, unspecified: Secondary | ICD-10-CM | POA: Diagnosis not present

## 2021-02-25 LAB — BASIC METABOLIC PANEL
Anion gap: 8 (ref 5–15)
BUN: 40 mg/dL — ABNORMAL HIGH (ref 8–23)
CO2: 34 mmol/L — ABNORMAL HIGH (ref 22–32)
Calcium: 8.8 mg/dL — ABNORMAL LOW (ref 8.9–10.3)
Chloride: 97 mmol/L — ABNORMAL LOW (ref 98–111)
Creatinine, Ser: 0.96 mg/dL (ref 0.44–1.00)
GFR, Estimated: 56 mL/min — ABNORMAL LOW (ref >60)
Glucose, Bld: 81 mg/dL (ref 70–99)
Potassium: 3.9 mmol/L (ref 3.5–5.1)
Sodium: 139 mmol/L (ref 135–145)

## 2021-02-25 LAB — CBC
HCT: 26.7 % — ABNORMAL LOW (ref 36.0–46.0)
Hemoglobin: 8.5 g/dL — ABNORMAL LOW (ref 12.0–15.0)
MCH: 32.3 pg (ref 26.0–34.0)
MCHC: 31.8 g/dL (ref 30.0–36.0)
MCV: 101.5 fL — ABNORMAL HIGH (ref 80.0–100.0)
Platelets: 167 10*3/uL (ref 150–400)
RBC: 2.63 MIL/uL — ABNORMAL LOW (ref 3.87–5.11)
RDW: 17.7 % — ABNORMAL HIGH (ref 11.5–15.5)
WBC: 5 10*3/uL (ref 4.0–10.5)
nRBC: 0 % (ref 0.0–0.2)

## 2021-02-25 NOTE — Discharge Summary (Signed)
DISCHARGE SUMMARY  Jaclyn Berry  MR#: [id number]  DOB:June 14, 1927  Date of Admission: 02/24/2021 Date of Discharge: 02/25/2021  Attending Physician:Tasnia Spegal Silvestre Gunner, MD  Patient's SEG:BTDVV, Jaclyn Robertson, NP  Consults: [facility]  Disposition: D/C home    Follow-up Appts:  Follow-up Information     Fargo, Amy E, NP Follow up.   Specialty: Adult Health Nurse Practitioner Contact information: [address] [place] [address] [phone]                 Tests Needing Follow-up: -recheck Hgb in 7-10 days   Discharge Diagnoses:   Initial presentation: 87 year old with a history of chronic CHF, severe aortic stenosis, atrial fibrillation, dysphagia, and chronic anemia who was advised to come to the ED by her PCP after her hemoglobin was noted to drop from 8.1-7.3 over a 3-week period.  The patient reported a history of progressive lethargy and exertional shortness of breath.  She chronically has dark stools, and has been taking iron pills.  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 -no definitive evidence of active GI bleeding -B12 this admission not low -ferritin not suggestive of ongoing bleeding -iron within low normal range as is TIBC suggestive of significant contribution from poor nutritional state - hemoglobin improved nicely with transfusion  Dysphagia Was undergoing outpatient work-up via GI office -barium swallow noted esophageal dysmotility with extensive tertiary contractions -Reglan appears to be helping some  Chronic combined diastolic and systolic CHF EF 30-35% with grade 2 diastolic dysfunction via TTE  May 2022  Severe aortic stenosis Well compensated at time of this admit  Paroxysmal atrial fibrillation On chronic digoxin and aspirin 3 times a week -discontinue aspirin in setting of possible ongoing GI bleeding -rate controlled  Allergies as of 02/25/2021       Reactions   Tape Other (See Comments)   SKIN TEARS VERY EASILY!!!!   Morphine And Related Nausea And Vomiting   Sulfa Antibiotics Rash        Medication List     STOP taking these medications    aspirin EC 81 MG tablet       TAKE these medications    acetaminophen 650 MG CR tablet Commonly known as: TYLENOL Take 650 mg by mouth every 12 (twelve) hours as needed for pain.   amitriptyline 50 MG tablet Commonly known as: ELAVIL Take 25 mg by mouth at bedtime.   bumetanide 1 MG tablet Commonly known as: BUMEX Take 1 mg by mouth daily.   digoxin 0.125 MG tablet Commonly known as: LANOXIN Take 1 tablet (0.125 mg total) by mouth daily.   docusate sodium 100 MG capsule Commonly known as: COLACE Take 100 mg by mouth as needed for mild constipation.   ferrous sulfate 325 (65 FE) MG tablet Take 325 mg by mouth 2 (two) times daily with a meal.   metoCLOPramide 5 MG tablet Commonly known as: REGLAN TAKE 1 TABLET BY MOUTH TWICE A DAY   mirtazapine 30 MG tablet Commonly known as: REMERON Take 1 tablet (30 mg total) by mouth at bedtime.   ondansetron 4 MG tablet Commonly known as: ZOFRAN Take 4 mg by mouth 3 (three) times daily as needed for nausea  or vomiting.   pantoprazole 40 MG tablet Commonly known as: PROTONIX TAKE 1 TABLET BY MOUTH TWICE A DAY   potassium chloride SA 20 MEQ tablet Commonly known as: KLOR-CON Take 1 tablet (20 mEq total) by mouth daily.   PRESERVISION AREDS 2+MULTI VIT PO Take 1 capsule by mouth at bedtime.   sennosides-docusate sodium 8.6-50 MG tablet Commonly known as: SENOKOT-S Take 1 tablet by mouth every other day.   sildenafil 20 MG tablet Commonly known as:  REVATIO Take 1 tablet (20 mg total) by mouth 2 (two) times daily.   traMADol 50 MG tablet Commonly known as: ULTRAM Take 50 mg by mouth every 12 (twelve) hours as needed (for pain).   vitamin B-12 1000 MCG tablet Commonly known as: CYANOCOBALAMIN Take 1,000 mcg by mouth daily.   vitamin C 500 MG tablet Commonly known as: ASCORBIC ACID Take 1 tablet (500 mg total) by mouth 2 (two) times daily.        Day of Discharge BP 113/66   Pulse (!) 53   Temp (!) 97.5 F (36.4 C) (Oral)   Resp 18   Ht 5\' 3"  (1.6 m)   Wt 33.7 kg   SpO2 100%   BMI 13.16 kg/m   Physical Exam: General: No acute respiratory distress Lungs: Clear to auscultation bilaterally without wheezes or crackles Cardiovascular: Regular rate and rhythm without murmur gallop or rub normal S1 and S2 Abdomen: Nontender, nondistended, soft, bowel sounds positive, no rebound, no ascites, no appreciable mass Extremities: No significant cyanosis, clubbing, or edema bilateral lower extremities  Basic Metabolic Panel: Recent Labs  Lab 02/23/21 0000 02/24/21 1407 02/25/21 0418  NA 138 136 139  K 4.1 3.8 3.9  CL 97* 95* 97*  CO2 33* 32 34*  GLUCOSE 88 130* 81  BUN 41* 40* 40*  CREATININE 1.07* 1.07* 0.96  CALCIUM 9.1 8.9 8.8*    Liver Function Tests: Recent Labs  Lab 02/23/21 0000 02/24/21 1407  AST 29 29  ALT 11 14  ALKPHOS  --  60  BILITOT 0.5 0.8  PROT 6.8 6.7  ALBUMIN  --  3.1*    CBC: Recent Labs  Lab 02/23/21 0000 02/24/21 1407 02/25/21 0418  WBC 5.2 5.2 5.0  NEUTROABS 3,942 4.3  --   HGB 7.3* 6.9* 8.5*  HCT 22.9* 22.1* 26.7*  MCV 99.1 107.8* 101.5*  PLT 236 200 167     BNP (last 3 results) Recent Labs    02/24/21 1407  BNP 526.5*    ProBNP (last 3 results) Recent Labs    11/04/20 1247  PROBNP 6,807*     Recent Results (from the past 240 hour(s))  Resp Panel by RT-PCR (Flu A&B, Covid) Nasopharyngeal Swab     Status: None   Collection Time: 02/24/21  3:04 PM   Specimen:  Nasopharyngeal Swab; Nasopharyngeal(NP) swabs in vial transport medium  Result Value Ref Range Status   SARS Coronavirus 2 by RT PCR NEGATIVE NEGATIVE Final    Comment: (NOTE) SARS-CoV-2 target nucleic acids are NOT DETECTED.  The SARS-CoV-2 RNA is generally detectable in upper respiratory specimens during the acute phase of infection. The lowest concentration of SARS-CoV-2 viral copies this assay can detect is 138 copies/mL. A negative result does not preclude SARS-Cov-2 infection and should not be used as the sole basis for treatment or other patient management decisions. A negative result may occur with  improper specimen collection/handling, submission of specimen other than nasopharyngeal swab, presence of viral mutation(s) within the areas  targeted by this assay, and inadequate number of viral copies(<138 copies/mL). A negative result must be combined with clinical observations, patient history, and epidemiological information. The expected result is Negative.  Fact Sheet for Patients:  BloggerCourse.com  Fact Sheet for Healthcare Providers:  SeriousBroker.it  This test is no t yet approved or cleared by the [place] FDA and  has been authorized for detection and/or diagnosis of SARS-CoV-2 by FDA under an Emergency Use Authorization (EUA). This EUA will remain  in effect (meaning this test can be used) for the duration of the COVID-19 declaration under Section 564(b)(1) of the Act, 21 U.S.C.section 360bbb-3(b)(1), unless the authorization is terminated  or revoked sooner.       Influenza A by PCR NEGATIVE NEGATIVE Final   Influenza B by PCR NEGATIVE NEGATIVE Final    Comment: (NOTE) The Xpert Xpress SARS-CoV-2/FLU/RSV plus assay is intended as an aid in the diagnosis of influenza from Nasopharyngeal swab specimens and should not be used as a sole basis for treatment. Nasal washings and aspirates are unacceptable for  Xpert Xpress SARS-CoV-2/FLU/RSV testing.  Fact Sheet for Patients: BloggerCourse.com  Fact Sheet for Healthcare Providers: SeriousBroker.it  This test is not yet approved or cleared by the [place] FDA and has been authorized for detection and/or diagnosis of SARS-CoV-2 by FDA under an Emergency Use Authorization (EUA). This EUA will remain in effect (meaning this test can be used) for the duration of the COVID-19 declaration under Section 564(b)(1) of the Act, 21 U.S.C. section 360bbb-3(b)(1), unless the authorization is terminated or revoked.  Performed at [facility], [address] [place], [place] [address]       Time spent in discharge (includes decision making & examination of pt): 30 minutes  02/25/2021, 10:46 AM   Lonia Blood, MD [facility] Office  [phone]

## 2021-02-25 NOTE — Progress Notes (Signed)
Patient will be discharging home with family later this morning. Medication education will be provided. Belongings were returned.

## 2021-02-26 LAB — TYPE AND SCREEN
ABO/RH(D): A POS
Antibody Screen: NEGATIVE
Unit division: 0

## 2021-02-26 LAB — BPAM RBC
Blood Product Expiration Date: 202208062359
ISSUE DATE / TIME: 202207151719
Unit Type and Rh: 6200

## 2021-02-27 ENCOUNTER — Encounter: Payer: Self-pay | Admitting: Orthopedic Surgery

## 2021-02-27 ENCOUNTER — Ambulatory Visit: Payer: Medicare Other

## 2021-02-28 ENCOUNTER — Other Ambulatory Visit: Payer: Self-pay | Admitting: Orthopedic Surgery

## 2021-02-28 DIAGNOSIS — R54 Age-related physical debility: Secondary | ICD-10-CM

## 2021-02-28 DIAGNOSIS — D649 Anemia, unspecified: Secondary | ICD-10-CM

## 2021-03-02 ENCOUNTER — Telehealth: Payer: Self-pay | Admitting: Orthopedic Surgery

## 2021-03-02 NOTE — Telephone Encounter (Signed)
Patients daughter called and said she spoke with someone from [facility] & they told her they do more of pt and nursing health rather than having a home health aide to be there during the day with her. I told her I would pass this message to our referral coordinator to look into.

## 2021-03-09 ENCOUNTER — Ambulatory Visit: Payer: Medicare Other | Admitting: Orthopedic Surgery

## 2021-03-09 ENCOUNTER — Other Ambulatory Visit: Payer: Medicare Other

## 2021-03-16 ENCOUNTER — Encounter: Payer: Self-pay | Admitting: Orthopedic Surgery

## 2021-03-16 ENCOUNTER — Ambulatory Visit (INDEPENDENT_AMBULATORY_CARE_PROVIDER_SITE_OTHER): Payer: Medicare Other | Admitting: Orthopedic Surgery

## 2021-03-16 ENCOUNTER — Other Ambulatory Visit: Payer: Self-pay

## 2021-03-16 VITALS — BP 108/72 | HR 62 | Temp 96.8°F | Ht 63.0 in | Wt 72.0 lb

## 2021-03-16 DIAGNOSIS — R627 Adult failure to thrive: Secondary | ICD-10-CM

## 2021-03-16 DIAGNOSIS — K5901 Slow transit constipation: Secondary | ICD-10-CM

## 2021-03-16 DIAGNOSIS — R54 Age-related physical debility: Secondary | ICD-10-CM

## 2021-03-16 DIAGNOSIS — D649 Anemia, unspecified: Secondary | ICD-10-CM

## 2021-03-16 DIAGNOSIS — L03116 Cellulitis of left lower limb: Secondary | ICD-10-CM

## 2021-03-16 DIAGNOSIS — M545 Low back pain, unspecified: Secondary | ICD-10-CM

## 2021-03-16 DIAGNOSIS — K224 Dyskinesia of esophagus: Secondary | ICD-10-CM

## 2021-03-16 DIAGNOSIS — I480 Paroxysmal atrial fibrillation: Secondary | ICD-10-CM

## 2021-03-16 DIAGNOSIS — G8929 Other chronic pain: Secondary | ICD-10-CM

## 2021-03-16 DIAGNOSIS — Z98818 Other dental procedure status: Secondary | ICD-10-CM

## 2021-03-16 MED ORDER — AMOXICILLIN 500 MG PO CAPS: 500.0000 mg | ORAL_CAPSULE | Freq: Once | ORAL | 0 refills | Status: AC

## 2021-03-16 MED ORDER — DOXYCYCLINE HYCLATE 100 MG PO TABS: 100.0000 mg | ORAL_TABLET | Freq: Two times a day (BID) | ORAL | 0 refills | Status: AC

## 2021-03-16 NOTE — Progress Notes (Signed)
Careteam: Patient Care Team: [name], NP as PCP - General (Adult Health Nurse Practitioner) [name], MD as PCP - Cardiology (Cardiology)  Seen by: Hazle Nordmann, AGNP-C  PLACE OF SERVICE:  [facility]  Advanced Directive information    Allergies  Allergen Reactions   Tape Other (See Comments)    SKIN TEARS VERY EASILY!!!!   Morphine And Related Nausea And Vomiting   Sulfa Antibiotics Rash    Chief Complaint  Patient presents with   Medical Management of Chronic Issues    Patient presents today for a 3 weeks follow-up.     HPI: Patient is a 87 y.o. female seen today for medical management of chronic conditions.   Daughter present for encounter.   07/15 she was hospitalized for low hemoglobin of 6.9. She received one unit of PRBC and GI was consulted. Patient and daughter refused colonoscopy/endoscopy and she was discharged home. She was advised to continue Protonix and stop aspirin. Energy has improved. She is not sob anymore after receiving blood. Denies black tarry stools or coffee ground emesis.   Plans to see cardiology in September. Denies chest pain, sob or ankle edema today.   Plans to get purewick from medical supply. May send paperwork to office.   06/04 she was diagnosed with left leg hematoma. Wound to left lower shin > 2 months, slow healing. Daughter has been applying clean non-adherent dressing daily. Also tries to let it air dry at times. She reports some new yellow drainage in past few days. She denies pain, swelling, erythema or fever. Daughter is trying to schedule dermatology appointment.   LBM 03/15/2021.   PT weekly. Wheelchair for long distances, [facility] to bathroom. No recent falls.   Continues to lose weight, she is 72 lbs today.     Review of Systems:  Review of Systems  Constitutional:  Positive for malaise/fatigue and weight loss. Negative for chills and fever.  HENT: Negative.    Eyes: Negative.   Respiratory:  Negative  for cough, shortness of breath and wheezing.   Cardiovascular:  Negative for chest pain and leg swelling.  Gastrointestinal:  Negative for abdominal pain, blood in stool, constipation, diarrhea, heartburn, nausea and vomiting.  Genitourinary:  Positive for frequency. Negative for dysuria and hematuria.  Musculoskeletal:  Positive for back pain, joint pain and myalgias. Negative for falls.  Skin:        Left shin wound.   Neurological:  Positive for weakness. Negative for dizziness and headaches.  Psychiatric/Behavioral:  Negative for depression. The patient is not nervous/anxious.    Past Medical History:  Diagnosis Date   A-fib (HCC)    Anemia    Aortic valve stenosis    Arthritis    Depression    Gall stones    Gallstone    Low BP    Mitral valve stenosis    Past Surgical History:  Procedure Laterality Date   COLONOSCOPY     [place]. Normal   ERCP  09/2019   [facility]   FRACTURE SURGERY  2016   Broken Arm   HIP SURGERY  2009   Replacement repaired     MELANOMA EXCISION  2013   right leg   mirtral valve repair  2008   ROTATOR CUFF REPAIR  2016   TOTAL HIP ARTHROPLASTY  2008   VAGINAL HYSTERECTOMY  2005   Social History:   reports that she has never smoked. She has never used smokeless tobacco. She reports  that she does not drink alcohol and does not use drugs.  Family History  Problem Relation Age of Onset   COPD Mother    Emphysema Mother    Stroke Father        With complications   Colon cancer Neg Hx    Esophageal cancer Neg Hx    Colon polyps Neg Hx     Medications: Patient's Medications  New Prescriptions   No medications on file  Previous Medications   ACETAMINOPHEN (TYLENOL) 650 MG CR TABLET    Take 650 mg by mouth every 12 (twelve) hours as needed for pain.   AMITRIPTYLINE (ELAVIL) 50 MG TABLET    Take 25 mg by mouth at bedtime.   BUMETANIDE (BUMEX) 1 MG TABLET    Take 1 mg by mouth daily.   DIGOXIN (LANOXIN) 0.125 MG TABLET     Take 1 tablet (0.125 mg total) by mouth daily.   DOCUSATE SODIUM (COLACE) 100 MG CAPSULE    Take 100 mg by mouth as needed for mild constipation.   FERROUS SULFATE 325 (65 FE) MG TABLET    Take 325 mg by mouth 2 (two) times daily with a meal.   METOCLOPRAMIDE (REGLAN) 5 MG TABLET    TAKE 1 TABLET BY MOUTH TWICE A DAY   MIRTAZAPINE (REMERON) 30 MG TABLET    Take 1 tablet (30 mg total) by mouth at bedtime.   MULTIPLE VITAMINS-MINERALS (PRESERVISION AREDS 2+MULTI VIT PO)    Take 1 capsule by mouth at bedtime.   ONDANSETRON (ZOFRAN) 4 MG TABLET    Take 4 mg by mouth 3 (three) times daily as needed for nausea or vomiting.   PANTOPRAZOLE (PROTONIX) 40 MG TABLET    TAKE 1 TABLET BY MOUTH TWICE A DAY   POTASSIUM CHLORIDE SA (KLOR-CON) 20 MEQ TABLET    Take 1 tablet (20 mEq total) by mouth daily.   SENNOSIDES-DOCUSATE SODIUM (SENOKOT-S) 8.6-50 MG TABLET    Take 1 tablet by mouth every other day.   SILDENAFIL (REVATIO) 20 MG TABLET    Take 1 tablet (20 mg total) by mouth 2 (two) times daily.   TRAMADOL (ULTRAM) 50 MG TABLET    Take 50 mg by mouth every 12 (twelve) hours as needed (for pain).   VITAMIN B-12 (CYANOCOBALAMIN) 1000 MCG TABLET    Take 1,000 mcg by mouth daily.   VITAMIN C (ASCORBIC ACID) 500 MG TABLET    Take 1 tablet (500 mg total) by mouth 2 (two) times daily.  Modified Medications   No medications on file  Discontinued Medications   No medications on file    Physical Exam:  Vitals:   03/16/21 1150  BP: 108/72  Pulse: 62  Temp: (!) 96.8 F (36 C)  TempSrc: Temporal  SpO2: 98%  Height: 5\' 3"  (1.6 m)   Body mass index is 13.16 kg/m. Wt Readings from Last 3 Encounters:  02/24/21 74 lb 4.7 oz (33.7 kg)  02/23/21 74 lb 3.2 oz (33.7 kg)  02/01/21 76 lb 6.4 oz (34.7 kg)    Physical Exam Vitals reviewed.  Constitutional:      Appearance: She is ill-appearing.     Comments: Temporal wasting, frail  HENT:     Head: Normocephalic.  Eyes:     General:        Right eye: No  discharge.        Left eye: No discharge.  Cardiovascular:     Rate and Rhythm: Normal rate. Rhythm irregular.  Pulses: Normal pulses.     Heart sounds: Normal heart sounds. No murmur heard. Pulmonary:     Effort: Pulmonary effort is normal. No respiratory distress.     Breath sounds: Normal breath sounds. No wheezing.  Abdominal:     General: Bowel sounds are normal. There is no distension.     Palpations: Abdomen is soft.     Tenderness: There is no abdominal tenderness.  Musculoskeletal:     Right lower leg: No edema.     Left lower leg: No edema.  Lymphadenopathy:     Cervical: No cervical adenopathy.  Skin:    General: Skin is warm and dry.     Capillary Refill: Capillary refill takes less than 2 seconds.     Comments: Open wound to eft anterior shin, CDI. Granulations tissue present in wound bed, some creamy yellow drainage present.   Neurological:     General: No focal deficit present.     Mental Status: She is alert and oriented to person, place, and time.     Motor: Weakness present.     Gait: Gait abnormal.  Psychiatric:        Mood and Affect: Mood is depressed. Affect is flat.        Behavior: Behavior normal.    Labs reviewed: Basic Metabolic Panel: Recent Labs    02/23/21 0000 02/24/21 1407 02/25/21 0418  NA 138 136 139  K 4.1 3.8 3.9  CL 97* 95* 97*  CO2 33* 32 34*  GLUCOSE 88 130* 81  BUN 41* 40* 40*  CREATININE 1.07* 1.07* 0.96  CALCIUM 9.1 8.9 8.8*   Liver Function Tests: Recent Labs    01/14/21 1902 02/23/21 0000 02/24/21 1407  AST 35 29 29  ALT 15 11 14   ALKPHOS 55  --  60  BILITOT 0.7 0.5 0.8  PROT 7.2 6.8 6.7  ALBUMIN 3.2*  --  3.1*   No results for input(s): LIPASE, AMYLASE in the last 8760 hours. No results for input(s): AMMONIA in the last 8760 hours. CBC: Recent Labs    02/01/21 0000 02/23/21 0000 02/24/21 1407 02/25/21 0418  WBC 4.6 5.2 5.2 5.0  NEUTROABS 3,524 3,942 4.3  --   HGB 8.1* 7.3* 6.9* 8.5*  HCT 25.1*  22.9* 22.1* 26.7*  MCV 103.3* 99.1 107.8* 101.5*  PLT 162 236 200 167   Lipid Panel: No results for input(s): CHOL, HDL, LDLCALC, TRIG, CHOLHDL, LDLDIRECT in the last 8760 hours. TSH: No results for input(s): TSH in the last 8760 hours. A1C: No results found for: HGBA1C   Assessment/Plan 1. Low hemoglobin - reports more energy and no sob today - 1 unit PRBC 07/15 for hgb 6.9 - GI consulted, recommended colonoscopy and endoscopy- refused - CBC with Differential/Platelet  2. Frail elderly - continues to lose weight, appears weak and frail - cont weekly PT - cont falls safety precautions - CMP  3. Cellulitis of left lower extremity - wound to left anterior shin slow healing> 2 months, began as hematoma - daughter reports some yellow drainage, no odor, granulation tissue present - cont daily dressing changes with non-adherent dressing - recommend wound open to air 12 hours daily - will start doxy due to drainage and rate of healing - doxycycline (VIBRA-TABS) 100 MG tablet; Take 1 tablet (100 mg total) by mouth 2 (two) times daily for 14 days.  Dispense: 28 tablet; Refill: 0  4. Other dental procedure status - upcoming dental procedure, patient request - amoxicillin (AMOXIL) 500  MG capsule; Take 1 capsule (500 mg total) by mouth once for 1 dose. Please take prior to dental cleaning/procedure.  Dispense: 5 capsule; Refill: 0  5. Adult failure to thrive - temporal wasting, frail appearance - progressive weight loss despite multiple interventions to promote weight gain - advanced directives discussed last visit, will discuss again with daughter - recommend hospice consult  6. Paroxysmal atrial fibrillation (HCC) - rate controlled with digoxin - aspirin d/c 07/15 due to unknown blood loss  7. Slow transit constipation - LBM 03/15/2021 - cont daily colace and senna QOD  8. Chronic bilateral low back pain without sciatica - stable with prn tylenol and tramadol 50 mg qhs  9.  Esophageal motility disorder - reports less nausea since starting reglan 5 mg po bid  Total time: 39 minutes. Greater than 50% of total time spent discussing medication management, symptom management, nutrition, and wound care.   Next appt: 04/20/2021  Hazle Nordmann, Juel Burrow  [facility] [phone]

## 2021-03-16 NOTE — Patient Instructions (Signed)
Try doxyxcline to help aid in healing  Increase protein intake- protein promotes wound healing  Please send any paperwork to support purewick

## 2021-03-17 ENCOUNTER — Encounter: Payer: Self-pay | Admitting: Physical Therapy

## 2021-03-17 ENCOUNTER — Ambulatory Visit: Payer: Medicare Other | Attending: Orthopedic Surgery | Admitting: Physical Therapy

## 2021-03-17 ENCOUNTER — Other Ambulatory Visit: Payer: Self-pay | Admitting: Orthopedic Surgery

## 2021-03-17 DIAGNOSIS — E039 Hypothyroidism, unspecified: Secondary | ICD-10-CM

## 2021-03-17 DIAGNOSIS — M6281 Muscle weakness (generalized): Secondary | ICD-10-CM | POA: Diagnosis present

## 2021-03-17 DIAGNOSIS — G8929 Other chronic pain: Secondary | ICD-10-CM

## 2021-03-17 DIAGNOSIS — R293 Abnormal posture: Secondary | ICD-10-CM | POA: Diagnosis present

## 2021-03-17 DIAGNOSIS — R2689 Other abnormalities of gait and mobility: Secondary | ICD-10-CM | POA: Diagnosis present

## 2021-03-17 DIAGNOSIS — R630 Anorexia: Secondary | ICD-10-CM

## 2021-03-17 DIAGNOSIS — R627 Adult failure to thrive: Secondary | ICD-10-CM

## 2021-03-17 DIAGNOSIS — D649 Anemia, unspecified: Secondary | ICD-10-CM

## 2021-03-17 LAB — CBC WITH DIFFERENTIAL/PLATELET
Absolute Monocytes: 230 cells/uL (ref 200–950)
Basophils Absolute: 30 cells/uL (ref 0–200)
Basophils Relative: 0.6 %
Eosinophils Absolute: 10 cells/uL — ABNORMAL LOW (ref 15–500)
Eosinophils Relative: 0.2 %
HCT: 31.1 % — ABNORMAL LOW (ref 35.0–45.0)
Hemoglobin: 10.1 g/dL — ABNORMAL LOW (ref 11.7–15.5)
Lymphs Abs: 845 cells/uL — ABNORMAL LOW (ref 850–3900)
MCH: 32.1 pg (ref 27.0–33.0)
MCHC: 32.5 g/dL (ref 32.0–36.0)
MCV: 98.7 fL (ref 80.0–100.0)
MPV: 10.8 fL (ref 7.5–12.5)
Monocytes Relative: 4.6 %
Neutro Abs: 3885 cells/uL (ref 1500–7800)
Neutrophils Relative %: 77.7 %
Platelets: 180 10*3/uL (ref 140–400)
RBC: 3.15 10*6/uL — ABNORMAL LOW (ref 3.80–5.10)
RDW: 12.9 % (ref 11.0–15.0)
Total Lymphocyte: 16.9 %
WBC: 5 10*3/uL (ref 3.8–10.8)

## 2021-03-17 LAB — HEPATIC FUNCTION PANEL
AG Ratio: 0.9 (calc) — ABNORMAL LOW (ref 1.0–2.5)
ALT: 16 U/L (ref 6–29)
AST: 36 U/L — ABNORMAL HIGH (ref 10–35)
Albumin: 3.4 g/dL — ABNORMAL LOW (ref 3.6–5.1)
Alkaline phosphatase (APISO): 72 U/L (ref 37–153)
Bilirubin, Direct: 0.2 mg/dL (ref 0.0–0.2)
Globulin: 3.6 g/dL (calc) (ref 1.9–3.7)
Indirect Bilirubin: 0.4 mg/dL (calc) (ref 0.2–1.2)
Total Bilirubin: 0.6 mg/dL (ref 0.2–1.2)
Total Protein: 7 g/dL (ref 6.1–8.1)

## 2021-03-17 LAB — COMPLETE METABOLIC PANEL WITH GFR
AG Ratio: 0.9 (calc) — ABNORMAL LOW (ref 1.0–2.5)
ALT: 16 U/L (ref 6–29)
AST: 36 U/L — ABNORMAL HIGH (ref 10–35)
Albumin: 3.4 g/dL — ABNORMAL LOW (ref 3.6–5.1)
Alkaline phosphatase (APISO): 72 U/L (ref 37–153)
BUN/Creatinine Ratio: 40 (calc) — ABNORMAL HIGH (ref 6–22)
BUN: 37 mg/dL — ABNORMAL HIGH (ref 7–25)
CO2: 31 mmol/L (ref 20–32)
Calcium: 9.3 mg/dL (ref 8.6–10.4)
Chloride: 94 mmol/L — ABNORMAL LOW (ref 98–110)
Creat: 0.93 mg/dL (ref 0.60–0.95)
Globulin: 3.6 g/dL (calc) (ref 1.9–3.7)
Glucose, Bld: 144 mg/dL — ABNORMAL HIGH (ref 65–139)
Potassium: 4.3 mmol/L (ref 3.5–5.3)
Sodium: 136 mmol/L (ref 135–146)
Total Bilirubin: 0.6 mg/dL (ref 0.2–1.2)
Total Protein: 7 g/dL (ref 6.1–8.1)
eGFR: 58 mL/min/{1.73_m2} — ABNORMAL LOW (ref >=60)

## 2021-03-17 LAB — TSH: TSH: 5.65 mIU/L — ABNORMAL HIGH (ref 0.40–4.50)

## 2021-03-17 MED ORDER — LEVOTHYROXINE SODIUM 25 MCG PO TABS: 25.0000 ug | ORAL_TABLET | Freq: Every day | ORAL | 3 refills | Status: AC

## 2021-03-17 NOTE — Therapy (Signed)
[facility] [address], [address] [place], [place], [address] Phone: [phone]   Fax:  [phone]  Physical Therapy Treatment  Patient Details  Name: Jaclyn Berry MRN: [id number] Date of Birth: 10-Feb-1927 Referring Provider (PT): [name], NP   Progress Note Reporting Period 12/26/20 to 03/17/21  See note below for Objective Data and Assessment of Progress/Goals.     Encounter Date: 03/17/2021   PT End of Session - 03/17/21 1139     Visit Number 20    Date for PT Re-Evaluation 05/12/21    Authorization Type Medicare Part A and B, KX at 15 visits    Progress Note Due on Visit 30    PT Start Time 1100    PT Stop Time 1124    PT Time Calculation (min) 24 min    Equipment Utilized During Treatment Other (comment)   RW, W/C   Activity Tolerance Patient limited by fatigue;Patient limited by pain    Behavior During Therapy [facility] for tasks assessed/performed             Past Medical History:  Diagnosis Date   A-fib (HCC)    Anemia    Aortic valve stenosis    Arthritis    Depression    Gall stones    Gallstone    Low BP    Mitral valve stenosis     Past Surgical History:  Procedure Laterality Date   [place]OSCOPY     [place]. Normal   [facility]   FRACTURE SURGERY  2016   Broken Arm   HIP SURGERY  2009   Replacement repaired     MELANOMA EXCISION  2013   right leg   mirtral valve repair  2008   ROTATOR CUFF REPAIR  2016   TOTAL HIP ARTHROPLASTY  2008   VAGINAL HYSTERECTOMY  2005    There were no vitals filed for this visit.   Subjective Assessment - 03/17/21 1104     Subjective Pt was admitted to hospital last week x 1 night for low hemoglobin and got a unit of blood.  Labs WNL now.  Pt continues to use W/C and needs assist x 1 for bed mobility and bathroom transfers.  Pt accompanied by adult daughter with whom she lives.  Lt LE hematoma is shrinking in size  and conitnues to be covered.  Antibiotic started for increased healing for this.  Lt leg may have seemed weaker yesterday and last night.    Patient is accompained by: Family member    Pertinent History recent hospital admission for low hemoglobin, Lt LE hematoma, PMH: h/o fall, Rt hip replacement with revision after a fall (2008/2009) with ongoing pain since femur fraction/revision, a-fib, reduced ejection fraction    Patient Stated Goals be more independent with bed mobility, work on gait with RW, transfers, strength    Currently in Pain? No/denies                [facility] PT Assessment - 03/17/21 0001       Assessment   Medical Diagnosis R26.81 (ICD-10-CM) - Unstable gait    Referring Provider (PT) Coletta Memos, Amy E, NP    Onset Date/Surgical Date --   last year since fall   Hand Dominance Right    Next MD Visit 1 month    Prior Therapy no      Observation/Other Assessments   Observations underweight, frail, rounded posture in W/C  Strength   Overall Strength Comments Rt hip flexion and knee ext 3-/5, other bil LE strength at least 3+/5 bil      Transfers   Transfers Sit to Stand    Sit to Stand 4: Min assist      Ambulation/Gait   Ambulation/Gait Yes    Ambulation Distance (Feet) 23 Feet    Assistive device Rolling walker    Gait Pattern Right flexed knee in stance;Left flexed knee in stance;Step-to pattern;Decreased step length - right;Decreased step length - left;Decreased weight shift to right;Right foot flat;Left foot flat    Gait Comments reported Rt posterior thigh pain and fatigue as limiting factors for gait                           [facility] Adult PT Treatment/Exercise - 03/17/21 0001       Exercises   Exercises Shoulder;Knee/Hip      Knee/Hip Exercises: Standing   Gait Training 23' with RW and close supervision      Knee/Hip Exercises: Seated   Other Seated Knee/Hip Exercises ankle DF/PF x 15 reps    Marching Limitations march to 2" step  1x10 Lt A/ROM, AA/ROM Rt 1x10 with assist by PT    Sit to Sand 1 set;with UE support   min assist     Shoulder Exercises: Seated   Other Seated Exercises biceps curls: 1# 2x10                      PT Short Term Goals - 02/06/21 1216       PT SHORT TERM GOAL #1   Title Pt will be able to perform intial HEP with supervision for safety    Status Achieved      PT SHORT TERM GOAL #2   Title Pt will be able to demo bed rolling Lt/Rt with supervision using proper form and muscle groups    Status Achieved      PT SHORT TERM GOAL #3   Title Pt will report at least 30% greater ease and confidence with bed mobility and transfers with supervision/min assist as needed    Status Achieved      PT SHORT TERM GOAL #4   Title Pt will be able to ambulate at least 20' using RW with min/mod SOB and recovery within 1' of sitting    Status Achieved               PT Long Term Goals - 03/17/21 1110       PT LONG TERM GOAL #1   Title Pt will improve functional strength to allow for ind/supervision only with bed mobility and chair transfers    Baseline needs mod/max assist x 1    Status On-going      PT LONG TERM GOAL #2   Title Pt will be able to demo coordination of step ups and downs with caregiver or PT min assist to simulate entry stairs access (4 stairs no railing) for improved safety.    Baseline needs 2 people for up > down    Status On-going      PT LONG TERM GOAL #3   Title Pt will demo improved awareness of more erect posture with less need for cueing during ther ex for spine posture.    Status On-going      PT LONG TERM GOAL #4   Title Pt will be able to ambulate at least 40'  with RW and brief standing or sitting break as needed for SOB secondary to heart condition to demo improved gait endurance for household amb (with supervision)    Baseline 23' 03/17/21, had reached 77' in past      PT LONG TERM GOAL #5   Title Pt and Pt's daughter will learn strategies and demo  improved efficiency of bed mobility including turning over in bed and transferring from bed to standing without rail (hospital bed rail interferes with getting up so can't use it).    Status Achieved                   Plan - 03/17/21 1140     Clinical Impression Statement Pt had to cancel last week's ERO due to overnight admission to hospital for low hemoglobin.  She received 1 unit of blood and labs are WNL now.  Pt has also been slowly healing for Lt LE hematoma and cellulitis for which she is starting antibiotics.  PT observed bandaged Lt medial/anterior leg which is signif smaller in size as compared to previous bandaging.  Pt has regressed in strength and functional mobility/endurance secondary to these health complications and age.  Pt continues to use W/C, hospital bed and RW and has support of adult daughter and family for assist with community access, bed mobility and bathroom transfers, and errands/appointments.  She requires mod/max assist x 2 for home access with stairs.  Pt was able to ambulate x 23' with RW with min assist for sit to stand transfer today.  LE strength ranges from 3-/5 (Rt hip flexion, Rt knee ext) to 3+/5 (remaining LE muscle groups).  She is motivated to continue PT 1x/week to regain functional strength and maximize her endurance for daily tasks and self-care with assistance.    Personal Factors and Comorbidities Age;Time since onset of injury/illness/exacerbation;Comorbidity 1;Comorbidity 2    Comorbidities a-fib, Rt THR with femur fracture revision    Examination-Activity Limitations Bathing;Transfers;Locomotion Level;Bed Mobility;Bend;Sit;Stairs;Stand;Toileting;Hygiene/Grooming;Dressing    Examination-Participation Restrictions Community Activity;Other    Stability/Clinical Decision Making Stable/Uncomplicated    Clinical Decision Making Low    Rehab Potential Fair    PT Frequency 1x / week    PT Duration 8 weeks    PT Treatment/Interventions ADLs/Self  Care Home Management;Moist Heat;Gait training;Stair training;Functional mobility training;Therapeutic activities;Therapeutic exercise;Neuromuscular re-education;Manual techniques;Patient/family education;Passive range of motion;Balance training    PT Next Visit Plan 1x/week x 8 weeks to continue functional strength, mobility and transfers    PT Home Exercise Plan Access Code: [email or web address]    Consulted and Agree with Plan of Care Patient;Family member/caregiver    Family Member Consulted Pt's daughter             Patient will benefit from skilled therapeutic intervention in order to improve the following deficits and impairments:  Abnormal gait, Decreased range of motion, Difficulty walking, Cardiopulmonary status limiting activity, Postural dysfunction, Decreased strength, Decreased mobility, Decreased balance, Improper body mechanics, Pain, Decreased activity tolerance  Visit Diagnosis: Abnormal posture - Plan: PT plan of care cert/re-cert  Muscle weakness (generalized) - Plan: PT plan of care cert/re-cert     Problem List Patient Active Problem List   Diagnosis Date Noted   Symptomatic anemia 02/24/2021   Senile purpura (HCC) 12/09/2020   Frail elderly 12/09/2020   Unstable gait 10/25/2020   Paroxysmal atrial fibrillation (HCC) 10/25/2020   Nonrheumatic mitral valve stenosis 10/25/2020   Nonrheumatic aortic valve stenosis 10/25/2020   Fatigue 10/25/2020   Generalized abdominal pain 10/25/2020  Iron deficiency anemia secondary to inadequate dietary iron intake 10/25/2020   Hypotension 10/25/2020   Chronic combined systolic and diastolic heart failure (HCC) 10/25/2020   Chronic bilateral low back pain without sciatica 10/25/2020   Multiple falls 10/25/2020   Slow transit constipation 10/25/2020   Poor appetite 10/25/2020   Underweight due to inadequate caloric intake 10/25/2020   Mattis Featherly, PT 03/17/21 11:50 AM   [facility] [address], [address] [place], [place], [address] Phone: [phone]   Fax:  [phone]  Name: Jaclyn Berry MRN: [id number] Date of Birth: 1927/05/05

## 2021-03-17 NOTE — Progress Notes (Signed)
Lab results discussed with daughter.

## 2021-03-23 ENCOUNTER — Ambulatory Visit: Payer: Medicare Other

## 2021-03-23 ENCOUNTER — Other Ambulatory Visit: Payer: Self-pay

## 2021-03-23 DIAGNOSIS — R293 Abnormal posture: Secondary | ICD-10-CM | POA: Diagnosis not present

## 2021-03-23 DIAGNOSIS — R2689 Other abnormalities of gait and mobility: Secondary | ICD-10-CM

## 2021-03-23 DIAGNOSIS — M6281 Muscle weakness (generalized): Secondary | ICD-10-CM

## 2021-03-23 NOTE — Therapy (Signed)
[facility] [address] [place], [place], [address] Phone: [phone]   Fax:  [phone]  Physical Therapy Treatment  Patient Details  Name: Jaclyn Berry MRN: [id number] Date of Birth: 1927-03-27 Referring Provider (PT): [name], NP   Encounter Date: 03/23/2021   PT End of Session - 03/23/21 1229     Visit Number 21    Date for PT Re-Evaluation 05/12/21    Authorization Type Medicare Part A and B, KX at 15 visits    Progress Note Due on Visit 30    PT Start Time 1154    PT Stop Time 1228    PT Time Calculation (min) 34 min    Activity Tolerance Patient limited by fatigue             Past Medical History:  Diagnosis Date   A-fib (HCC)    Anemia    Aortic valve stenosis    Arthritis    Depression    Gall stones    Gallstone    Low BP    Mitral valve stenosis     Past Surgical History:  Procedure Laterality Date   COLONOSCOPY     [place]. Normal   [facility]  [facility]   FRACTURE SURGERY  2016   Broken Arm   HIP SURGERY  2009   Replacement repaired     MELANOMA EXCISION  2013   right leg   mirtral valve repair  2008   ROTATOR CUFF REPAIR  2016   TOTAL HIP ARTHROPLASTY  2008   VAGINAL HYSTERECTOMY  2005    There were no vitals filed for this visit.   Subjective Assessment - 03/23/21 1157     Subjective I've been doing my walking at night when I have to go to the bathroom.    Currently in Pain? No/denies                               [facility] Adult PT Treatment/Exercise - 03/23/21 0001       Lumbar Exercises: Aerobic   Nustep L1 x 10' PT closely monitored for fatigue      Knee/Hip Exercises: Seated   Ball Squeeze 2x10, hold 3 sec    Marching Limitations march to 2" step 1x10 Lt A/ROM, AA/ROM Rt 1x10 with assist by PT    Hamstring Curl Strengthening;Both;20 reps    Hamstring Limitations green loop    Abduction/Adduction   Strengthening;Both;2 sets;10 reps    Abd/Adduction Limitations blue band clamshells      Shoulder Exercises: Seated   Other Seated Exercises biceps curls: 1# 2x10                      PT Short Term Goals - 02/06/21 1216       PT SHORT TERM GOAL #1   Title Pt will be able to perform intial HEP with supervision for safety    Status Achieved      PT SHORT TERM GOAL #2   Title Pt will be able to demo bed rolling Lt/Rt with supervision using proper form and muscle groups    Status Achieved      PT SHORT TERM GOAL #3   Title Pt will report at least 30% greater ease and confidence with bed mobility and transfers with supervision/min assist as needed    Status Achieved  PT SHORT TERM GOAL #4   Title Pt will be able to ambulate at least 20' using RW with min/mod SOB and recovery within 1' of sitting    Status Achieved               PT Long Term Goals - 03/17/21 1110       PT LONG TERM GOAL #1   Title Pt will improve functional strength to allow for ind/supervision only with bed mobility and chair transfers    Baseline needs mod/max assist x 1    Status On-going      PT LONG TERM GOAL #2   Title Pt will be able to demo coordination of step ups and downs with caregiver or PT min assist to simulate entry stairs access (4 stairs no railing) for improved safety.    Baseline needs 2 people for up > down    Status On-going      PT LONG TERM GOAL #3   Title Pt will demo improved awareness of more erect posture with less need for cueing during ther ex for spine posture.    Status On-going      PT LONG TERM GOAL #4   Title Pt will be able to ambulate at least 40' with RW and brief standing or sitting break as needed for SOB secondary to heart condition to demo improved gait endurance for household amb (with supervision)    Baseline 23' 03/17/21, had reached 62' in past      PT LONG TERM GOAL #5   Title Pt and Pt's daughter will learn strategies and demo improved  efficiency of bed mobility including turning over in bed and transferring from bed to standing without rail (hospital bed rail interferes with getting up so can't use it).    Status Achieved                   Plan - 03/23/21 1209     Clinical Impression Statement Pt has been able to be more active since her blood transfusion and elevating hemoglobin levels. Pt has regressed in strength and functional mobility/endurance secondary to these health complications and age.  Pt continues to use W/C, hospital bed and RW and has support of adult daughter and family for assist with community access, bed mobility and bathroom transfers, and errands/appointments.  She requires mod/max assist x 2 for home access with stairs.  Pt transferred with min assistance from wheelchair to NuStep and was able to do the NuStep for 10 minutes.  She is motivated to continue PT 1x/week to regain functional strength and maximize her endurance for daily tasks and self-care with assistance.    PT Frequency 1x / week    PT Duration 8 weeks    PT Treatment/Interventions ADLs/Self Care Home Management;Moist Heat;Gait training;Stair training;Functional mobility training;Therapeutic activities;Therapeutic exercise;Neuromuscular re-education;Manual techniques;Patient/family education;Passive range of motion;Balance training    PT Next Visit Plan 1x/week x 8 weeks to continue functional strength, mobility and transfers    PT Home Exercise Plan Access Code: [email or web address]    Consulted and Agree with Plan of Care Patient;Family member/caregiver             Patient will benefit from skilled therapeutic intervention in order to improve the following deficits and impairments:  Abnormal gait, Decreased range of motion, Difficulty walking, Cardiopulmonary status limiting activity, Postural dysfunction, Decreased strength, Decreased mobility, Decreased balance, Improper body mechanics, Pain, Decreased activity tolerance  Visit  Diagnosis: Abnormal posture  Muscle weakness (generalized)  Other abnormalities of gait and mobility     Problem List Patient Active Problem List   Diagnosis Date Noted   Symptomatic anemia 02/24/2021   Senile purpura (HCC) 12/09/2020   Frail elderly 12/09/2020   Unstable gait 10/25/2020   Paroxysmal atrial fibrillation (HCC) 10/25/2020   Nonrheumatic mitral valve stenosis 10/25/2020   Nonrheumatic aortic valve stenosis 10/25/2020   Fatigue 10/25/2020   Generalized abdominal pain 10/25/2020   Iron deficiency anemia secondary to inadequate dietary iron intake 10/25/2020   Hypotension 10/25/2020   Chronic combined systolic and diastolic heart failure (HCC) 10/25/2020   Chronic bilateral low back pain without sciatica 10/25/2020   Multiple falls 10/25/2020   Slow transit constipation 10/25/2020   Poor appetite 10/25/2020   Underweight due to inadequate caloric intake 10/25/2020    Lorrene Reid, PT 03/23/21 12:31 PM   [facility] [address] [place], [place], [address] Phone: [phone]   Fax:  [phone]  Name: Jaclyn Berry MRN: [id number] Date of Birth: 1926/11/09

## 2021-03-27 ENCOUNTER — Other Ambulatory Visit: Payer: Self-pay

## 2021-03-27 ENCOUNTER — Ambulatory Visit: Payer: Medicare Other

## 2021-03-27 DIAGNOSIS — R293 Abnormal posture: Secondary | ICD-10-CM

## 2021-03-27 DIAGNOSIS — R2689 Other abnormalities of gait and mobility: Secondary | ICD-10-CM

## 2021-03-27 DIAGNOSIS — M6281 Muscle weakness (generalized): Secondary | ICD-10-CM

## 2021-03-27 NOTE — Therapy (Signed)
[facility] [address], [address] [place], [place], [address] Phone: [phone]   Fax:  [phone]  Physical Therapy Treatment  Patient Details  Name: Jaclyn Berry MRN: [id number] Date of Birth: 1926-08-17 Referring Provider (PT): [name], NP   Encounter Date: 03/27/2021   PT End of Session - 03/27/21 1314     Visit Number 22    Date for PT Re-Evaluation 05/12/21    Authorization Type Medicare Part A and B, KX at 15 visits    Progress Note Due on Visit 30    PT Start Time 1232   rest breaks   PT Stop Time 1309    PT Time Calculation (min) 37 min    Activity Tolerance Patient limited by fatigue    Behavior During Therapy [facility] for tasks assessed/performed             Past Medical History:  Diagnosis Date   A-fib (HCC)    Anemia    Aortic valve stenosis    Arthritis    Depression    Gall stones    Gallstone    Low BP    Mitral valve stenosis     Past Surgical History:  Procedure Laterality Date   [place]OSCOPY     [place]. Normal   [facility]  [facility]   FRACTURE SURGERY  2016   Broken Arm   HIP SURGERY  2009   Replacement repaired     MELANOMA EXCISION  2013   right leg   mirtral valve repair  2008   ROTATOR CUFF REPAIR  2016   TOTAL HIP ARTHROPLASTY  2008   VAGINAL HYSTERECTOMY  2005    There were no vitals filed for this visit.   Subjective Assessment - 03/27/21 1245     Subjective I was tired after last session but not too bad.    Patient Stated Goals be more independent with bed mobility, work on gait with RW, transfers, strength    Currently in Pain? No/denies                               [facility] Adult PT Treatment/Exercise - 03/27/21 0001       Lumbar Exercises: Aerobic   Nustep L1 x 10' PT closely monitored for fatigue      Knee/Hip Exercises: Seated   Ball Squeeze 2x10, hold 3 sec    Hamstring Curl Strengthening;Both;20 reps     Hamstring Limitations green loop    Abduction/Adduction  Strengthening;Both;2 sets;10 reps    Abd/Adduction Limitations red loop    Sit to Sand 1 set;5 reps;with UE support                      PT Short Term Goals - 02/06/21 1216       PT SHORT TERM GOAL #1   Title Pt will be able to perform intial HEP with supervision for safety    Status Achieved      PT SHORT TERM GOAL #2   Title Pt will be able to demo bed rolling Lt/Rt with supervision using proper form and muscle groups    Status Achieved      PT SHORT TERM GOAL #3   Title Pt will report at least 30% greater ease and confidence with bed mobility and transfers with supervision/min assist as needed    Status Achieved  PT SHORT TERM GOAL #4   Title Pt will be able to ambulate at least 20' using RW with min/mod SOB and recovery within 1' of sitting    Status Achieved               PT Long Term Goals - 03/17/21 1110       PT LONG TERM GOAL #1   Title Pt will improve functional strength to allow for ind/supervision only with bed mobility and chair transfers    Baseline needs mod/max assist x 1    Status On-going      PT LONG TERM GOAL #2   Title Pt will be able to demo coordination of step ups and downs with caregiver or PT min assist to simulate entry stairs access (4 stairs no railing) for improved safety.    Baseline needs 2 people for up > down    Status On-going      PT LONG TERM GOAL #3   Title Pt will demo improved awareness of more erect posture with less need for cueing during ther ex for spine posture.    Status On-going      PT LONG TERM GOAL #4   Title Pt will be able to ambulate at least 40' with RW and brief standing or sitting break as needed for SOB secondary to heart condition to demo improved gait endurance for household amb (with supervision)    Baseline 23' 03/17/21, had reached 95' in past      PT LONG TERM GOAL #5   Title Pt and Pt's daughter will learn strategies and demo  improved efficiency of bed mobility including turning over in bed and transferring from bed to standing without rail (hospital bed rail interferes with getting up so can't use it).    Status Achieved                   Plan - 03/27/21 1249     Clinical Impression Statement Pt did well after last session although was fatigued x 1 day after that level of activity. Pt has regressed in strength and functional mobility/endurance secondary to these health complications and age.  Pt continues to use W/C, hospital bed and RW and has support of adult daughter and family for assist with community access, bed mobility and bathroom transfers, and errands/appointments.  She requires mod/max assist x 2 for home access with stairs.  Pt transferred with min assistance from wheelchair to NuStep and was able to do the NuStep for 10 minutes.  She is motivated to continue PT 1x/week to regain functional strength and maximize her endurance for daily tasks and self-care with assistance.    PT Frequency 1x / week    PT Duration 8 weeks    PT Treatment/Interventions ADLs/Self Care Home Management;Moist Heat;Gait training;Stair training;Functional mobility training;Therapeutic activities;Therapeutic exercise;Neuromuscular re-education;Manual techniques;Patient/family education;Passive range of motion;Balance training    PT Next Visit Plan strength, endurance, work on transfers and gait    PT Home Exercise Plan Access Code: [email or web address]    Consulted and Agree with Plan of Care Patient;Family member/caregiver             Patient will benefit from skilled therapeutic intervention in order to improve the following deficits and impairments:  Abnormal gait, Decreased range of motion, Difficulty walking, Cardiopulmonary status limiting activity, Postural dysfunction, Decreased strength, Decreased mobility, Decreased balance, Improper body mechanics, Pain, Decreased activity tolerance  Visit Diagnosis: Muscle weakness  (generalized)  Abnormal posture  Other abnormalities  of gait and mobility     Problem List Patient Active Problem List   Diagnosis Date Noted   Symptomatic anemia 02/24/2021   Senile purpura (HCC) 12/09/2020   Frail elderly 12/09/2020   Unstable gait 10/25/2020   Paroxysmal atrial fibrillation (HCC) 10/25/2020   Nonrheumatic mitral valve stenosis 10/25/2020   Nonrheumatic aortic valve stenosis 10/25/2020   Fatigue 10/25/2020   Generalized abdominal pain 10/25/2020   Iron deficiency anemia secondary to inadequate dietary iron intake 10/25/2020   Hypotension 10/25/2020   Chronic combined systolic and diastolic heart failure (HCC) 10/25/2020   Chronic bilateral low back pain without sciatica 10/25/2020   Multiple falls 10/25/2020   Slow transit constipation 10/25/2020   Poor appetite 10/25/2020   Underweight due to inadequate caloric intake 10/25/2020    Lorrene Reid, PT 03/27/21 1:16 PM    [facility] [address] [place], [place], [address] Phone: [phone]   Fax:  [phone]  Name: Jaclyn Berry MRN: [id number] Date of Birth: Sep 03, 1926

## 2021-04-05 ENCOUNTER — Other Ambulatory Visit: Payer: Self-pay

## 2021-04-05 ENCOUNTER — Encounter: Payer: Self-pay | Admitting: Physical Therapy

## 2021-04-05 ENCOUNTER — Ambulatory Visit: Payer: Medicare Other | Admitting: Physical Therapy

## 2021-04-05 DIAGNOSIS — R293 Abnormal posture: Secondary | ICD-10-CM

## 2021-04-05 DIAGNOSIS — R2689 Other abnormalities of gait and mobility: Secondary | ICD-10-CM

## 2021-04-05 DIAGNOSIS — M6281 Muscle weakness (generalized): Secondary | ICD-10-CM

## 2021-04-05 NOTE — Therapy (Signed)
[facility] [address] [place], [place], [address] Phone: [phone]   Fax:  [phone]  Physical Therapy Treatment  Patient Details  Name: Jaclyn Berry MRN: [id number] Date of Birth: 04-Aug-1927 Referring Provider (PT): [name], NP   Encounter Date: 04/05/2021   PT End of Session - 04/05/21 1149     Visit Number 23    Date for PT Re-Evaluation 05/12/21    Authorization Type Medicare Part A and B, KX at 15 visits    Progress Note Due on Visit 30    PT Start Time 1143    PT Stop Time 1217    PT Time Calculation (min) 34 min    Activity Tolerance Patient limited by fatigue    Behavior During Therapy [facility] for tasks assessed/performed             Past Medical History:  Diagnosis Date   A-fib (HCC)    Anemia    Aortic valve stenosis    Arthritis    Depression    Gall stones    Gallstone    Low BP    Mitral valve stenosis     Past Surgical History:  Procedure Laterality Date   [place]OSCOPY     [place] Normal   [facility]   FRACTURE SURGERY  2016   Broken Arm   HIP SURGERY  2009   Replacement repaired     MELANOMA EXCISION  2013   right leg   mirtral valve repair  2008   ROTATOR CUFF REPAIR  2016   TOTAL HIP ARTHROPLASTY  2008   VAGINAL HYSTERECTOMY  2005    There were no vitals filed for this visit.   Subjective Assessment - 04/05/21 1147     Subjective I continue to need help getting out of the bed and to the bathroom.  We try to go outside as much as we can.    Pertinent History recent hospital admission for low hemoglobin, Lt LE hematoma, PMH: h/o fall, Rt hip replacement with revision after a fall (2008/2009) with ongoing pain since femur fraction/revision, a-fib, reduced ejection fraction    Patient Stated Goals be more independent with bed mobility, work on gait with RW, transfers, strength    Currently in Pain? No/denies                                [facility] Adult PT Treatment/Exercise - 04/05/21 0001       Exercises   Exercises Shoulder;Knee/Hip;Lumbar      Lumbar Exercises: Aerobic   Nustep L1 x 10' PT closely monitored for fatigue      Lumbar Exercises: Seated   Other Seated Lumbar Exercises heel toe raises x 20      Knee/Hip Exercises: Seated   Ball Squeeze 2x10, hold 3 sec    Clamshell with TheraBand Red   sent Pt home with tied green band into loop for HEP   Marching Right;Left;2 sets;10 reps    Marching Limitations march to 2" step 1x10 Lt A/ROM, AA/ROM Rt 2x10 with assist by PT    Hamstring Curl Strengthening;Both;2 sets;10 reps    Hamstring Limitations green loop    Abduction/Adduction  Strengthening;Both;2 sets;10 reps    Abd/Adduction Limitations red loop    Sit to Sand 1 set;5 reps;with UE support  PT Short Term Goals - 02/06/21 1216       PT SHORT TERM GOAL #1   Title Pt will be able to perform intial HEP with supervision for safety    Status Achieved      PT SHORT TERM GOAL #2   Title Pt will be able to demo bed rolling Lt/Rt with supervision using proper form and muscle groups    Status Achieved      PT SHORT TERM GOAL #3   Title Pt will report at least 30% greater ease and confidence with bed mobility and transfers with supervision/min assist as needed    Status Achieved      PT SHORT TERM GOAL #4   Title Pt will be able to ambulate at least 20' using RW with min/mod SOB and recovery within 1' of sitting    Status Achieved               PT Long Term Goals - 03/17/21 1110       PT LONG TERM GOAL #1   Title Pt will improve functional strength to allow for ind/supervision only with bed mobility and chair transfers    Baseline needs mod/max assist x 1    Status On-going      PT LONG TERM GOAL #2   Title Pt will be able to demo coordination of step ups and downs with caregiver or PT min assist to simulate entry stairs  access (4 stairs no railing) for improved safety.    Baseline needs 2 people for up > down    Status On-going      PT LONG TERM GOAL #3   Title Pt will demo improved awareness of more erect posture with less need for cueing during ther ex for spine posture.    Status On-going      PT LONG TERM GOAL #4   Title Pt will be able to ambulate at least 40' with RW and brief standing or sitting break as needed for SOB secondary to heart condition to demo improved gait endurance for household amb (with supervision)    Baseline 23' 03/17/21, had reached 70' in past      PT LONG TERM GOAL #5   Title Pt and Pt's daughter will learn strategies and demo improved efficiency of bed mobility including turning over in bed and transferring from bed to standing without rail (hospital bed rail interferes with getting up so can't use it).    Status Achieved                   Plan - 04/05/21 1223     Clinical Impression Statement Pt states PT always helps her.  She has been spending more time outside with assistance from her family now that the heat has broken. Pt continues to use W/C, hospital bed and RW and has support of adult daughter and family for assist with community access, bed mobility and bathroom transfers, and errands/appointments. She requires mod/max assist x 2 for home access with stairs. Pt transferred with min assistance from wheelchair to NuStep and was able to do the NuStep for 10 minutes. She is motivated but does get fatigued easily and is limited by Rt hip weakness and pain.  She needs PT assist for seated march to 2" step on Rt.  She requested to be done with session at 63' secondary to fatigue.    Comorbidities a-fib, Rt THR with femur fracture revision    PT Frequency 1x /  week    PT Duration 8 weeks    PT Treatment/Interventions ADLs/Self Care Home Management;Moist Heat;Gait training;Stair training;Functional mobility training;Therapeutic activities;Therapeutic  exercise;Neuromuscular re-education;Manual techniques;Patient/family education;Passive range of motion;Balance training    PT Next Visit Plan strength, endurance, work on transfers and gait    Consulted and Agree with Plan of Care Patient;Family member/caregiver    Family Member Consulted Pt's daughter             Patient will benefit from skilled therapeutic intervention in order to improve the following deficits and impairments:     Visit Diagnosis: Muscle weakness (generalized)  Abnormal posture  Other abnormalities of gait and mobility     Problem List Patient Active Problem List   Diagnosis Date Noted   Symptomatic anemia 02/24/2021   Senile purpura (HCC) 12/09/2020   Frail elderly 12/09/2020   Unstable gait 10/25/2020   Paroxysmal atrial fibrillation (HCC) 10/25/2020   Nonrheumatic mitral valve stenosis 10/25/2020   Nonrheumatic aortic valve stenosis 10/25/2020   Fatigue 10/25/2020   Generalized abdominal pain 10/25/2020   Iron deficiency anemia secondary to inadequate dietary iron intake 10/25/2020   Hypotension 10/25/2020   Chronic combined systolic and diastolic heart failure (HCC) 10/25/2020   Chronic bilateral low back pain without sciatica 10/25/2020   Multiple falls 10/25/2020   Slow transit constipation 10/25/2020   Poor appetite 10/25/2020   Underweight due to inadequate caloric intake 10/25/2020    Jabre Heo, PT 04/05/21 12:27 PM   [facility] [address] [place], [place], [address] Phone: [phone]   Fax:  [phone]  Name: Daleysa Kristiansen MRN: [id number] Date of Birth: 09/28/1926

## 2021-04-12 ENCOUNTER — Ambulatory Visit: Payer: Medicare Other

## 2021-04-12 ENCOUNTER — Other Ambulatory Visit: Payer: Self-pay

## 2021-04-12 ENCOUNTER — Ambulatory Visit: Payer: Medicare Other | Admitting: Internal Medicine

## 2021-04-12 DIAGNOSIS — M6281 Muscle weakness (generalized): Secondary | ICD-10-CM

## 2021-04-12 DIAGNOSIS — R293 Abnormal posture: Secondary | ICD-10-CM

## 2021-04-12 DIAGNOSIS — R2689 Other abnormalities of gait and mobility: Secondary | ICD-10-CM

## 2021-04-12 NOTE — Therapy (Signed)
[facility] [address] [place], [place], [address] Phone: [phone]   Fax:  [phone]  Physical Therapy Treatment  Patient Details  Name: Jaclyn Berry MRN: [id number] Date of Birth: Mar 05, 1927 Referring Provider (PT): [name], NP   Encounter Date: 04/12/2021   PT End of Session - 04/12/21 1305     Visit Number 24    Date for PT Re-Evaluation 05/12/21    Authorization Type Medicare Part A and B, KX at 15 visits    Progress Note Due on Visit 30    PT Start Time 1232    PT Stop Time 1305    PT Time Calculation (min) 33 min    Activity Tolerance Patient limited by fatigue    Behavior During Therapy [facility] for tasks assessed/performed             Past Medical History:  Diagnosis Date   A-fib (HCC)    Anemia    Aortic valve stenosis    Arthritis    Depression    Gall stones    Gallstone    Low BP    Mitral valve stenosis     Past Surgical History:  Procedure Laterality Date   [place]ONOSCOPY     [place]. Normal   [facility]CP  [facility]   FRACTURE SURGERY  2016   Broken Arm   HIP SURGERY  2009   Replacement repaired     MELANOMA EXCISION  2013   right leg   mirtral valve repair  2008   ROTATOR CUFF REPAIR  2016   TOTAL HIP ARTHROPLASTY  2008   VAGINAL HYSTERECTOMY  2005    There were no vitals filed for this visit.                      [facility] Adult PT Treatment/Exercise - 04/12/21 0001       Lumbar Exercises: Aerobic   Nustep L1 x 10' PT closely monitored for fatigue      Lumbar Exercises: Seated   Other Seated Lumbar Exercises heel toe raises x 20      Knee/Hip Exercises: Seated   Ball Squeeze 2x10, hold 3 sec    Clamshell with TheraBand Red   sent Pt home with tied green band into loop for HEP   Marching Right;Left;2 sets;10 reps    Marching Limitations march to 2" step 1x10 Lt A/ROM, AA/ROM Rt 2x10 with assist by PT    Hamstring Curl  Strengthening;Both;2 sets;10 reps    Hamstring Limitations green loop    Abduction/Adduction  Strengthening;Both;2 sets;10 reps    Abd/Adduction Limitations red loop                      PT Short Term Goals - 02/06/21 1216       PT SHORT TERM GOAL #1   Title Pt will be able to perform intial HEP with supervision for safety    Status Achieved      PT SHORT TERM GOAL #2   Title Pt will be able to demo bed rolling Lt/Rt with supervision using proper form and muscle groups    Status Achieved      PT SHORT TERM GOAL #3   Title Pt will report at least 30% greater ease and confidence with bed mobility and transfers with supervision/min assist as needed    Status Achieved      PT SHORT TERM GOAL #  4   Title Pt will be able to ambulate at least 20' using RW with min/mod SOB and recovery within 1' of sitting    Status Achieved               PT Long Term Goals - 03/17/21 1110       PT LONG TERM GOAL #1   Title Pt will improve functional strength to allow for ind/supervision only with bed mobility and chair transfers    Baseline needs mod/max assist x 1    Status On-going      PT LONG TERM GOAL #2   Title Pt will be able to demo coordination of step ups and downs with caregiver or PT min assist to simulate entry stairs access (4 stairs no railing) for improved safety.    Baseline needs 2 people for up > down    Status On-going      PT LONG TERM GOAL #3   Title Pt will demo improved awareness of more erect posture with less need for cueing during ther ex for spine posture.    Status On-going      PT LONG TERM GOAL #4   Title Pt will be able to ambulate at least 40' with RW and brief standing or sitting break as needed for SOB secondary to heart condition to demo improved gait endurance for household amb (with supervision)    Baseline 23' 03/17/21, had reached 23' in past      PT LONG TERM GOAL #5   Title Pt and Pt's daughter will learn strategies and demo improved  efficiency of bed mobility including turning over in bed and transferring from bed to standing without rail (hospital bed rail interferes with getting up so can't use it).    Status Achieved                   Plan - 04/12/21 1259     Clinical Impression Statement Pt states PT always helps her to be more mobile. Pt continues to use W/C, hospital bed and RW and has support of adult daughter and family for assist with community access, bed mobility and bathroom transfers, and errands/appointments. She requires mod/max assist x 2 for home access with stairs. Pt transferred with min assistance from wheelchair to NuStep and was able to do the NuStep for 10 minutes. She is motivated but does get fatigued easily and is limited by Rt hip weakness and pain.  She needs PT assist for seated march to 2" step on Rt.  Session ended early due to fatigue today.    PT Frequency 1x / week    PT Duration 8 weeks    PT Treatment/Interventions ADLs/Self Care Home Management;Moist Heat;Gait training;Stair training;Functional mobility training;Therapeutic activities;Therapeutic exercise;Neuromuscular re-education;Manual techniques;Patient/family education;Passive range of motion;Balance training    PT Next Visit Plan strength, endurance, work on transfers and gait    PT Home Exercise Plan Access Code: [email or web address]    Consulted and Agree with Plan of Care Patient;Family member/caregiver    Family Member Consulted Pt's daughter             Patient will benefit from skilled therapeutic intervention in order to improve the following deficits and impairments:  Abnormal gait, Decreased range of motion, Difficulty walking, Cardiopulmonary status limiting activity, Postural dysfunction, Decreased strength, Decreased mobility, Decreased balance, Improper body mechanics, Pain, Decreased activity tolerance  Visit Diagnosis: Muscle weakness (generalized)  Abnormal posture  Other abnormalities of gait and  mobility  Problem List Patient Active Problem List   Diagnosis Date Noted   Symptomatic anemia 02/24/2021   Senile purpura (HCC) 12/09/2020   Frail elderly 12/09/2020   Unstable gait 10/25/2020   Paroxysmal atrial fibrillation (HCC) 10/25/2020   Nonrheumatic mitral valve stenosis 10/25/2020   Nonrheumatic aortic valve stenosis 10/25/2020   Fatigue 10/25/2020   Generalized abdominal pain 10/25/2020   Iron deficiency anemia secondary to inadequate dietary iron intake 10/25/2020   Hypotension 10/25/2020   Chronic combined systolic and diastolic heart failure (HCC) 10/25/2020   Chronic bilateral low back pain without sciatica 10/25/2020   Multiple falls 10/25/2020   Slow transit constipation 10/25/2020   Poor appetite 10/25/2020   Underweight due to inadequate caloric intake 10/25/2020    Lorrene Reid, PT 04/12/21 1:11 PM  [facility] [address] [place], [place], [address] Phone: [phone]   Fax:  [phone]  Name: Jaclyn Berry MRN: [id number] Date of Birth: [birth year]

## 2021-04-19 ENCOUNTER — Ambulatory Visit: Payer: Medicare Other

## 2021-04-20 ENCOUNTER — Ambulatory Visit: Payer: Medicare Other | Admitting: Orthopedic Surgery

## 2021-04-20 ENCOUNTER — Other Ambulatory Visit: Payer: Self-pay

## 2021-04-20 ENCOUNTER — Other Ambulatory Visit: Payer: Medicare Other

## 2021-04-21 LAB — COMPLETE METABOLIC PANEL WITH GFR
AG Ratio: 0.8 (calc) — ABNORMAL LOW (ref 1.0–2.5)
ALT: 14 U/L (ref 6–29)
AST: 26 U/L (ref 10–35)
Albumin: 3.1 g/dL — ABNORMAL LOW (ref 3.6–5.1)
Alkaline phosphatase (APISO): 100 U/L (ref 37–153)
BUN/Creatinine Ratio: 53 (calc) — ABNORMAL HIGH (ref 6–22)
BUN: 44 mg/dL — ABNORMAL HIGH (ref 7–25)
CO2: 31 mmol/L (ref 20–32)
Calcium: 9 mg/dL (ref 8.6–10.4)
Chloride: 94 mmol/L — ABNORMAL LOW (ref 98–110)
Creat: 0.83 mg/dL (ref 0.60–0.95)
Globulin: 3.7 g/dL (calc) (ref 1.9–3.7)
Glucose, Bld: 147 mg/dL — ABNORMAL HIGH (ref 65–99)
Potassium: 3.9 mmol/L (ref 3.5–5.3)
Sodium: 137 mmol/L (ref 135–146)
Total Bilirubin: 0.7 mg/dL (ref 0.2–1.2)
Total Protein: 6.8 g/dL (ref 6.1–8.1)
eGFR: 66 mL/min/{1.73_m2} (ref >=60)

## 2021-04-21 LAB — CBC WITH DIFFERENTIAL/PLATELET
Absolute Monocytes: 459 cells/uL (ref 200–950)
Basophils Absolute: 34 cells/uL (ref 0–200)
Basophils Relative: 0.4 %
Eosinophils Absolute: 17 cells/uL (ref 15–500)
Eosinophils Relative: 0.2 %
HCT: 29.2 % — ABNORMAL LOW (ref 35.0–45.0)
Hemoglobin: 9.3 g/dL — ABNORMAL LOW (ref 11.7–15.5)
Lymphs Abs: 944 cells/uL (ref 850–3900)
MCH: 31.2 pg (ref 27.0–33.0)
MCHC: 31.8 g/dL — ABNORMAL LOW (ref 32.0–36.0)
MCV: 98 fL (ref 80.0–100.0)
MPV: 10.8 fL (ref 7.5–12.5)
Monocytes Relative: 5.4 %
Neutro Abs: 7047 cells/uL (ref 1500–7800)
Neutrophils Relative %: 82.9 %
Platelets: 199 10*3/uL (ref 140–400)
RBC: 2.98 10*6/uL — ABNORMAL LOW (ref 3.80–5.10)
RDW: 12.9 % (ref 11.0–15.0)
Total Lymphocyte: 11.1 %
WBC: 8.5 10*3/uL (ref 3.8–10.8)

## 2021-04-21 LAB — TSH: TSH: 1.41 mIU/L (ref 0.40–4.50)

## 2021-04-24 ENCOUNTER — Ambulatory Visit: Payer: Medicare Other

## 2021-04-26 ENCOUNTER — Other Ambulatory Visit: Payer: Self-pay | Admitting: Orthopedic Surgery

## 2021-04-26 ENCOUNTER — Ambulatory Visit: Payer: Medicare Other | Admitting: Internal Medicine

## 2021-04-26 DIAGNOSIS — M545 Low back pain, unspecified: Secondary | ICD-10-CM

## 2021-04-26 NOTE — Telephone Encounter (Signed)
Treatment agreement on file from April 2022. Fill history not showing in epic

## 2021-04-27 ENCOUNTER — Telehealth: Payer: Self-pay

## 2021-04-27 ENCOUNTER — Telehealth (INDEPENDENT_AMBULATORY_CARE_PROVIDER_SITE_OTHER): Payer: Medicare Other | Admitting: Nurse Practitioner

## 2021-04-27 ENCOUNTER — Other Ambulatory Visit: Payer: Self-pay

## 2021-04-27 DIAGNOSIS — U071 COVID-19: Secondary | ICD-10-CM | POA: Diagnosis not present

## 2021-04-27 MED ORDER — MOLNUPIRAVIR EUA 200MG CAPSULE: 4.0000 | ORAL_CAPSULE | Freq: Two times a day (BID) | ORAL | 0 refills | Status: AC

## 2021-04-27 NOTE — Progress Notes (Signed)
This service is provided via telemedicine  No vital signs collected/recorded due to the encounter was a telemedicine visit.   Location of patient (ex: home, work):  Home  Patient consents to a telephone visit:  Yes, see encounter dated 04/27/2021  Location of the provider (ex: office, home):  [facility]   Name of any referring provider:  Hazle Nordmann, NP  Names of all persons participating in the telemedicine service and their role in the encounter:  Abbey Chatters, Nurse Practitioner, Elveria Royals, CMA, and patient.   Time spent on call:  7 minutes with medical assistant

## 2021-04-27 NOTE — Progress Notes (Signed)
Careteam: Patient Care Team: Jaclyn Heir, NP as PCP - General (Adult Health Nurse Practitioner) [name], MD as PCP - Cardiology (Cardiology)  Advanced Directive information    Allergies  Allergen Reactions   Tape Other (See Comments)    SKIN TEARS VERY EASILY!!!!   Morphine And Related Nausea And Vomiting   Sulfa Antibiotics Rash    Chief Complaint  Patient presents with   Acute Visit    Cough and congestion. Body aches. No headache and no fever. Symptoms started Tuesday.Patient tested positive for COVID yesterday. Patient took coricidin     HPI: Patient is a 87 y.o. female due to positive COVID.  Reports she is having trouble getting comfortable.  Has body aches.  Has tramadol that she takes a night.  She gave her coricidin with tylenol.  She gave her night time tylenol 325, dextromethorphan 10mg  with Doxylamine She has a weak cough. Barely has a cough but has congestion. Mild congestion. Shortness of breath- which she has a baseline- has been a little worse since positive COVID. Drinking a lot of sprite and trying to get her to drink water.  No nasal congestion No sore throat.  No headaches  No fever.   Her son in law had COVID last week then daughter tested positive 5 days ago. She has had a mild cough and was very sensitive.  She has been getting weaker over the last 2 weeks.   Eats minimally. Has hx of GI upset. Hx of constipation. Takes OTC for that.   Review of Systems:  Review of Systems  Constitutional:  Positive for malaise/fatigue. Negative for chills and fever.  HENT:  Negative for congestion and sore throat.   Respiratory:  Positive for cough and shortness of breath. Negative for sputum production.   Cardiovascular:  Negative for chest pain.  Skin:  Negative for itching and rash.  Neurological:  Negative for dizziness and headaches.   Past Medical History:  Diagnosis Date   A-fib (HCC)    Anemia    Aortic valve stenosis     Arthritis    Depression    Gall stones    Gallstone    Low BP    Mitral valve stenosis    Past Surgical History:  Procedure Laterality Date   COLONOSCOPY     . Normal   [facility]   FRACTURE SURGERY  2016   Broken Arm   HIP SURGERY  2009   Replacement repaired     MELANOMA EXCISION  2013   right leg   mirtral valve repair  2008   ROTATOR CUFF REPAIR  2016   TOTAL HIP ARTHROPLASTY  2008   VAGINAL HYSTERECTOMY  2005   Social History:   reports that she has never smoked. She has never used smokeless tobacco. She reports that she does not drink alcohol and does not use drugs.  Family History  Problem Relation Age of Onset   COPD Mother    Emphysema Mother    Stroke Father        With complications   Colon cancer Neg Hx    Esophageal cancer Neg Hx    Colon polyps Neg Hx     Medications: Patient's Medications  New Prescriptions   No medications on file  Previous Medications   ACETAMINOPHEN (TYLENOL) 650 MG CR TABLET    Take 650 mg by mouth every 12 (twelve) hours as needed for pain.  AMITRIPTYLINE (ELAVIL) 50 MG TABLET    Take 25 mg by mouth at bedtime.   BUMETANIDE (BUMEX) 1 MG TABLET    Take 1 mg by mouth daily.   DIGOXIN (LANOXIN) 0.125 MG TABLET    Take 1 tablet (0.125 mg total) by mouth daily.   DOCUSATE SODIUM (COLACE) 100 MG CAPSULE    Take 100 mg by mouth as needed for mild constipation.   FERROUS SULFATE 325 (65 FE) MG TABLET    Take 325 mg by mouth 2 (two) times daily with a meal.   LEVOTHYROXINE (SYNTHROID) 25 MCG TABLET    Take 1 tablet (25 mcg total) by mouth daily.   METOCLOPRAMIDE (REGLAN) 5 MG TABLET    TAKE 1 TABLET BY MOUTH TWICE A DAY   MIRTAZAPINE (REMERON) 30 MG TABLET    Take 1 tablet (30 mg total) by mouth at bedtime.   MULTIPLE VITAMINS-MINERALS (PRESERVISION AREDS 2+MULTI VIT PO)    Take 1 capsule by mouth at bedtime.   ONDANSETRON (ZOFRAN) 4 MG TABLET    Take 4 mg by mouth 3 (three) times daily as  needed for nausea or vomiting.   PANTOPRAZOLE (PROTONIX) 40 MG TABLET    TAKE 1 TABLET BY MOUTH TWICE A DAY   POTASSIUM CHLORIDE SA (KLOR-CON) 20 MEQ TABLET    Take 1 tablet (20 mEq total) by mouth daily.   SENNOSIDES-DOCUSATE SODIUM (SENOKOT-S) 8.6-50 MG TABLET    Take 1 tablet by mouth every other day.   SILDENAFIL (REVATIO) 20 MG TABLET    Take 1 tablet (20 mg total) by mouth 2 (two) times daily.   TRAMADOL (ULTRAM) 50 MG TABLET    TAKE 1 TABLET (50 MG TOTAL) BY MOUTH 2 (TWO) TIMES DAILY FOR 5 DAYS.   VITAMIN B-12 (CYANOCOBALAMIN) 1000 MCG TABLET    Take 1,000 mcg by mouth daily.   VITAMIN C (ASCORBIC ACID) 500 MG TABLET    Take 1 tablet (500 mg total) by mouth 2 (two) times daily.  Modified Medications   No medications on file  Discontinued Medications   No medications on file    Physical Exam:  There were no vitals filed for this visit. There is no height or weight on file to calculate BMI. Wt Readings from Last 3 Encounters:  03/16/21 72 lb (32.7 kg)  02/24/21 74 lb 4.7 oz (33.7 kg)  02/23/21 74 lb 3.2 oz (33.7 kg)     Labs reviewed: Basic Metabolic Panel: Recent Labs    02/25/21 0418 03/16/21 1137 04/20/21 1144  NA 139 136 137  K 3.9 4.3 3.9  CL 97* 94* 94*  CO2 34* 31 31  GLUCOSE 81 144* 147*  BUN 40* 37* 44*  CREATININE 0.96 0.93 0.83  CALCIUM 8.8* 9.3 9.0  TSH  --  5.65* 1.41   Liver Function Tests: Recent Labs    01/14/21 1902 02/23/21 0000 02/24/21 1407 03/16/21 1137 04/20/21 1144  AST 35   < > 29 36*  36* 26  ALT 15   < > 14 16  16 14   ALKPHOS 55  --  60  --   --   BILITOT 0.7   < > 0.8 0.6  0.6 0.7  PROT 7.2   < > 6.7 7.0  7.0 6.8  ALBUMIN 3.2*  --  3.1*  --   --    < > = values in this interval not displayed.   No results for input(s): LIPASE, AMYLASE in the last 8760 hours. No  results for input(s): AMMONIA in the last 8760 hours. CBC: Recent Labs    02/24/21 1407 02/25/21 0418 03/16/21 1137 04/20/21 1144  WBC 5.2 5.0 5.0 8.5   NEUTROABS 4.3  --  3,885 7,047  HGB 6.9* 8.5* 10.1* 9.3*  HCT 22.1* 26.7* 31.1* 29.2*  MCV 107.8* 101.5* 98.7 98.0  PLT 200 167 180 199   Lipid Panel: No results for input(s): CHOL, HDL, LDLCALC, TRIG, CHOLHDL, LDLDIRECT in the last 8760 hours. TSH: Recent Labs    03/16/21 1137 04/20/21 1144  TSH 5.65* 1.41   A1C: No results found for: HGBA1C   Assessment/Plan 1. COVID-19 -recommended to take Vit C 1000 mg twice daily, Vit D 2000 units daily and zinc 50 mg daily for 7 days- take with food.  -maintain proper hydration -tylenol 1000 mg by mouth every 8 hours as needed fever/body aches.  -mucinex DM twice daily as needed for cough and chest congestion -do not sit in bed all day, sit up in chair and walk around as tolerated -nasal wash daily and nasal saline as needed -education provided on when to follow up and when to seek immediate medication attention through the ED.  - high risk for complication due to age and co-morbidities unable to take paxlovid due to interactions with other medications -molnupiravir EUA (LAGEVRIO) 200 mg CAPS capsule; Take 4 capsules (800 mg total) by mouth 2 (two) times daily for 5 days.  Dispense: 40 capsule; Refill: 0 -follow up precautions given -isolation precautions discussed    Marshayla Mitschke K. Biagio Borg  [facility] [phone]    Virtual Visit via mychart video  I connected with patient on 04/27/21 at 10:00 AM EDT by video and verified that I am speaking with the correct person using two identifiers.  Location: Patient: home Provider: [facility]   I discussed the limitations, risks, security and privacy concerns of performing an evaluation and management service by telephone and the availability of in person appointments. I also discussed with the patient that there may be a patient responsible charge related to this service. The patient expressed understanding and agreed to proceed.   I discussed the assessment  and treatment plan with the patient. The patient was provided an opportunity to ask questions and all were answered. The patient agreed with the plan and demonstrated an understanding of the instructions.   The patient was advised to call back or seek an in-person evaluation if the symptoms worsen or if the condition fails to improve as anticipated.  I provided 18 minutes of non-face-to-face time during this encounter.  Janene Harvey. Biagio Borg Avs printed and mailed

## 2021-04-27 NOTE — Telephone Encounter (Signed)
[name], boule are scheduled for a virtual visit with your provider today.    Just as we do with appointments in the office, we must obtain your consent to participate.  Your consent will be active for this visit and any virtual visit you may have with one of our providers in the next 365 days.    If you have a MyChart account, I can also send a copy of this consent to you electronically.  All virtual visits are billed to your insurance company just like a traditional visit in the office.  As this is a virtual visit, video technology does not allow for your provider to perform a traditional examination.  This may limit your provider's ability to fully assess your condition.  If your provider identifies any concerns that need to be evaluated in person or the need to arrange testing such as labs, EKG, etc, we will make arrangements to do so.    Although advances in technology are sophisticated, we cannot ensure that it will always work on either your end or our end.  If the connection with a video visit is poor, we may have to switch to a telephone visit.  With either a video or telephone visit, we are not always able to ensure that we have a secure connection.   I need to obtain your verbal consent now.   Are you willing to proceed with your visit today?   Jaclyn Berry has provided verbal consent on 04/27/2021 for a virtual visit (video or telephone).   Elveria Royals, CMA 04/27/2021  11:53 AM

## 2021-05-03 ENCOUNTER — Telehealth (INDEPENDENT_AMBULATORY_CARE_PROVIDER_SITE_OTHER): Payer: Medicare Other | Admitting: Nurse Practitioner

## 2021-05-03 ENCOUNTER — Encounter: Payer: Medicare Other | Admitting: Physical Therapy

## 2021-05-03 ENCOUNTER — Inpatient Hospital Stay (HOSPITAL_COMMUNITY)
Admission: EM | Admit: 2021-05-03 | Discharge: 2021-05-13 | DRG: 193 | Disposition: E | Payer: Medicare Other | Attending: Internal Medicine | Admitting: Internal Medicine

## 2021-05-03 ENCOUNTER — Other Ambulatory Visit: Payer: Self-pay

## 2021-05-03 ENCOUNTER — Emergency Department (HOSPITAL_COMMUNITY): Payer: Medicare Other

## 2021-05-03 ENCOUNTER — Encounter: Payer: Self-pay | Admitting: Nurse Practitioner

## 2021-05-03 DIAGNOSIS — T460X1A Poisoning by cardiac-stimulant glycosides and drugs of similar action, accidental (unintentional), initial encounter: Secondary | ICD-10-CM

## 2021-05-03 DIAGNOSIS — R531 Weakness: Secondary | ICD-10-CM

## 2021-05-03 DIAGNOSIS — Z823 Family history of stroke: Secondary | ICD-10-CM

## 2021-05-03 DIAGNOSIS — J1282 Pneumonia due to coronavirus disease 2019: Secondary | ICD-10-CM

## 2021-05-03 DIAGNOSIS — I083 Combined rheumatic disorders of mitral, aortic and tricuspid valves: Secondary | ICD-10-CM | POA: Diagnosis present

## 2021-05-03 DIAGNOSIS — J159 Unspecified bacterial pneumonia: Principal | ICD-10-CM | POA: Diagnosis present

## 2021-05-03 DIAGNOSIS — Z7189 Other specified counseling: Secondary | ICD-10-CM | POA: Diagnosis not present

## 2021-05-03 DIAGNOSIS — Z91048 Other nonmedicinal substance allergy status: Secondary | ICD-10-CM

## 2021-05-03 DIAGNOSIS — Y95 Nosocomial condition: Secondary | ICD-10-CM | POA: Diagnosis present

## 2021-05-03 DIAGNOSIS — G9340 Encephalopathy, unspecified: Secondary | ICD-10-CM | POA: Diagnosis not present

## 2021-05-03 DIAGNOSIS — Z90710 Acquired absence of both cervix and uterus: Secondary | ICD-10-CM

## 2021-05-03 DIAGNOSIS — I110 Hypertensive heart disease with heart failure: Secondary | ICD-10-CM | POA: Diagnosis present

## 2021-05-03 DIAGNOSIS — F32A Depression, unspecified: Secondary | ICD-10-CM | POA: Diagnosis present

## 2021-05-03 DIAGNOSIS — R638 Other symptoms and signs concerning food and fluid intake: Secondary | ICD-10-CM | POA: Diagnosis not present

## 2021-05-03 DIAGNOSIS — R5383 Other fatigue: Secondary | ICD-10-CM | POA: Diagnosis not present

## 2021-05-03 DIAGNOSIS — I350 Nonrheumatic aortic (valve) stenosis: Secondary | ICD-10-CM | POA: Diagnosis not present

## 2021-05-03 DIAGNOSIS — R54 Age-related physical debility: Secondary | ICD-10-CM | POA: Diagnosis present

## 2021-05-03 DIAGNOSIS — R64 Cachexia: Secondary | ICD-10-CM | POA: Diagnosis present

## 2021-05-03 DIAGNOSIS — K219 Gastro-esophageal reflux disease without esophagitis: Secondary | ICD-10-CM | POA: Diagnosis present

## 2021-05-03 DIAGNOSIS — F419 Anxiety disorder, unspecified: Secondary | ICD-10-CM | POA: Diagnosis present

## 2021-05-03 DIAGNOSIS — U099 Post covid-19 condition, unspecified: Secondary | ICD-10-CM | POA: Diagnosis present

## 2021-05-03 DIAGNOSIS — Z825 Family history of asthma and other chronic lower respiratory diseases: Secondary | ICD-10-CM

## 2021-05-03 DIAGNOSIS — J9601 Acute respiratory failure with hypoxia: Secondary | ICD-10-CM | POA: Diagnosis present

## 2021-05-03 DIAGNOSIS — J690 Pneumonitis due to inhalation of food and vomit: Secondary | ICD-10-CM | POA: Diagnosis present

## 2021-05-03 DIAGNOSIS — N179 Acute kidney failure, unspecified: Secondary | ICD-10-CM | POA: Diagnosis not present

## 2021-05-03 DIAGNOSIS — R627 Adult failure to thrive: Secondary | ICD-10-CM | POA: Diagnosis not present

## 2021-05-03 DIAGNOSIS — E43 Unspecified severe protein-calorie malnutrition: Secondary | ICD-10-CM | POA: Insufficient documentation

## 2021-05-03 DIAGNOSIS — R7989 Other specified abnormal findings of blood chemistry: Secondary | ICD-10-CM | POA: Diagnosis not present

## 2021-05-03 DIAGNOSIS — Z681 Body mass index (BMI) 19 or less, adult: Secondary | ICD-10-CM

## 2021-05-03 DIAGNOSIS — T460X5A Adverse effect of cardiac-stimulant glycosides and drugs of similar action, initial encounter: Secondary | ICD-10-CM | POA: Diagnosis present

## 2021-05-03 DIAGNOSIS — E860 Dehydration: Secondary | ICD-10-CM | POA: Diagnosis not present

## 2021-05-03 DIAGNOSIS — E039 Hypothyroidism, unspecified: Secondary | ICD-10-CM | POA: Diagnosis present

## 2021-05-03 DIAGNOSIS — I959 Hypotension, unspecified: Secondary | ICD-10-CM | POA: Diagnosis present

## 2021-05-03 DIAGNOSIS — I4819 Other persistent atrial fibrillation: Secondary | ICD-10-CM | POA: Diagnosis present

## 2021-05-03 DIAGNOSIS — I428 Other cardiomyopathies: Secondary | ICD-10-CM | POA: Diagnosis present

## 2021-05-03 DIAGNOSIS — Z789 Other specified health status: Secondary | ICD-10-CM | POA: Diagnosis not present

## 2021-05-03 DIAGNOSIS — U071 COVID-19: Secondary | ICD-10-CM

## 2021-05-03 DIAGNOSIS — I5042 Chronic combined systolic (congestive) and diastolic (congestive) heart failure: Secondary | ICD-10-CM | POA: Diagnosis present

## 2021-05-03 DIAGNOSIS — R296 Repeated falls: Secondary | ICD-10-CM | POA: Diagnosis present

## 2021-05-03 DIAGNOSIS — Z515 Encounter for palliative care: Secondary | ICD-10-CM

## 2021-05-03 DIAGNOSIS — E8809 Other disorders of plasma-protein metabolism, not elsewhere classified: Secondary | ICD-10-CM | POA: Diagnosis present

## 2021-05-03 DIAGNOSIS — R0902 Hypoxemia: Secondary | ICD-10-CM | POA: Diagnosis present

## 2021-05-03 DIAGNOSIS — Z882 Allergy status to sulfonamides status: Secondary | ICD-10-CM

## 2021-05-03 DIAGNOSIS — I493 Ventricular premature depolarization: Secondary | ICD-10-CM | POA: Diagnosis present

## 2021-05-03 DIAGNOSIS — Z66 Do not resuscitate: Secondary | ICD-10-CM | POA: Diagnosis not present

## 2021-05-03 DIAGNOSIS — Z79899 Other long term (current) drug therapy: Secondary | ICD-10-CM

## 2021-05-03 DIAGNOSIS — K224 Dyskinesia of esophagus: Secondary | ICD-10-CM | POA: Diagnosis present

## 2021-05-03 DIAGNOSIS — R7889 Finding of other specified substances, not normally found in blood: Secondary | ICD-10-CM

## 2021-05-03 DIAGNOSIS — I4891 Unspecified atrial fibrillation: Secondary | ICD-10-CM | POA: Diagnosis not present

## 2021-05-03 DIAGNOSIS — I82452 Acute embolism and thrombosis of left peroneal vein: Secondary | ICD-10-CM | POA: Diagnosis present

## 2021-05-03 DIAGNOSIS — I444 Left anterior fascicular block: Secondary | ICD-10-CM | POA: Diagnosis present

## 2021-05-03 DIAGNOSIS — Z885 Allergy status to narcotic agent status: Secondary | ICD-10-CM

## 2021-05-03 DIAGNOSIS — Z85820 Personal history of malignant melanoma of skin: Secondary | ICD-10-CM

## 2021-05-03 DIAGNOSIS — Z79890 Hormone replacement therapy (postmenopausal): Secondary | ICD-10-CM

## 2021-05-03 DIAGNOSIS — R609 Edema, unspecified: Secondary | ICD-10-CM | POA: Diagnosis not present

## 2021-05-03 LAB — CBC WITH DIFFERENTIAL/PLATELET
Abs Immature Granulocytes: 0.1 10*3/uL — ABNORMAL HIGH (ref 0.00–0.07)
Basophils Absolute: 0 10*3/uL (ref 0.0–0.1)
Basophils Relative: 0 %
Eosinophils Absolute: 0 10*3/uL (ref 0.0–0.5)
Eosinophils Relative: 0 %
HCT: 26 % — ABNORMAL LOW (ref 36.0–46.0)
Hemoglobin: 8.4 g/dL — ABNORMAL LOW (ref 12.0–15.0)
Immature Granulocytes: 1 %
Lymphocytes Relative: 3 %
Lymphs Abs: 0.3 10*3/uL — ABNORMAL LOW (ref 0.7–4.0)
MCH: 31.7 pg (ref 26.0–34.0)
MCHC: 32.3 g/dL (ref 30.0–36.0)
MCV: 98.1 fL (ref 80.0–100.0)
Monocytes Absolute: 0.3 10*3/uL (ref 0.1–1.0)
Monocytes Relative: 3 %
Neutro Abs: 11.1 10*3/uL — ABNORMAL HIGH (ref 1.7–7.7)
Neutrophils Relative %: 93 %
Platelets: 258 10*3/uL (ref 150–400)
RBC: 2.65 MIL/uL — ABNORMAL LOW (ref 3.87–5.11)
RDW: 14.5 % (ref 11.5–15.5)
WBC: 11.9 10*3/uL — ABNORMAL HIGH (ref 4.0–10.5)
nRBC: 0 % (ref 0.0–0.2)

## 2021-05-03 LAB — LACTIC ACID, PLASMA: Lactic Acid, Venous: 1.8 mmol/L (ref 0.5–1.9)

## 2021-05-03 LAB — COMPREHENSIVE METABOLIC PANEL
ALT: 18 U/L (ref 0–44)
AST: 40 U/L (ref 15–41)
Albumin: 2 g/dL — ABNORMAL LOW (ref 3.5–5.0)
Alkaline Phosphatase: 145 U/L — ABNORMAL HIGH (ref 38–126)
Anion gap: 12 (ref 5–15)
BUN: 64 mg/dL — ABNORMAL HIGH (ref 8–23)
CO2: 29 mmol/L (ref 22–32)
Calcium: 8.8 mg/dL — ABNORMAL LOW (ref 8.9–10.3)
Chloride: 98 mmol/L (ref 98–111)
Creatinine, Ser: 1.12 mg/dL — ABNORMAL HIGH (ref 0.44–1.00)
GFR, Estimated: 46 mL/min — ABNORMAL LOW (ref >60)
Glucose, Bld: 65 mg/dL — ABNORMAL LOW (ref 70–99)
Potassium: 4.8 mmol/L (ref 3.5–5.1)
Sodium: 139 mmol/L (ref 135–145)
Total Bilirubin: 1 mg/dL (ref 0.3–1.2)
Total Protein: 6.7 g/dL (ref 6.5–8.1)

## 2021-05-03 LAB — D-DIMER, QUANTITATIVE: D-Dimer, Quant: 6.43 ug/mL-FEU — ABNORMAL HIGH (ref 0.00–0.50)

## 2021-05-03 LAB — URINALYSIS, ROUTINE W REFLEX MICROSCOPIC
Bilirubin Urine: NEGATIVE
Glucose, UA: NEGATIVE mg/dL
Hgb urine dipstick: NEGATIVE
Ketones, ur: NEGATIVE mg/dL
Nitrite: NEGATIVE
Protein, ur: NEGATIVE mg/dL
Specific Gravity, Urine: <1.005 — ABNORMAL LOW (ref 1.005–1.030)
pH: 6 (ref 5.0–8.0)

## 2021-05-03 LAB — URINALYSIS, MICROSCOPIC (REFLEX): Bacteria, UA: NONE SEEN

## 2021-05-03 LAB — PROCALCITONIN: Procalcitonin: 7.4 ng/mL

## 2021-05-03 LAB — DIGOXIN LEVEL: Digoxin Level: 3.3 ng/mL (ref 0.8–2.0)

## 2021-05-03 LAB — LACTATE DEHYDROGENASE: LDH: 193 U/L — ABNORMAL HIGH (ref 98–192)

## 2021-05-03 LAB — FIBRINOGEN: Fibrinogen: 739 mg/dL — ABNORMAL HIGH (ref 210–475)

## 2021-05-03 LAB — TRIGLYCERIDES: Triglycerides: 131 mg/dL (ref <150)

## 2021-05-03 MED ORDER — ENOXAPARIN SODIUM 40 MG/0.4ML IJ SOSY: 40.0000 mg | PREFILLED_SYRINGE | INTRAMUSCULAR | Status: DC

## 2021-05-03 MED ORDER — SODIUM CHLORIDE 0.9 % IV SOLN: INTRAVENOUS | Status: DC

## 2021-05-03 MED ORDER — DEXTROSE 50 % IV SOLN
50.0000 mL | Freq: Once | INTRAVENOUS | Status: AC
  Administered 2021-05-03: 50 mL via INTRAVENOUS
  Filled 2021-05-03: qty 50

## 2021-05-03 MED ORDER — SODIUM CHLORIDE 0.9 % IV SOLN
1.0000 g | Freq: Once | INTRAVENOUS | Status: AC
  Administered 2021-05-03: 1 g via INTRAVENOUS
  Filled 2021-05-03: qty 10

## 2021-05-03 MED ORDER — SODIUM CHLORIDE 0.9 % IV SOLN: 4.0000 | Freq: Once | INTRAVENOUS | Status: DC

## 2021-05-03 NOTE — ED Provider Notes (Signed)
[facility] EMERGENCY DEPARTMENT Provider Note   CSN: [id number] Arrival date & time: 05-24-21  1732     History Chief Complaint  Patient presents with   Weakness    Jaclyn Berry is a 87 y.o. female.   Weakness  This patient is a 87 year old female, she is accompanied by her daughter today who is the primary historian and caregiver, the patient does live with her daughter.  She has a known history of atrial fibrillation, aortic valve stenosis, she also has a history of low blood pressure.  She is very elderly, her baseline functioning is low and currently prior to getting COVID was able to walk short distances with a walker but needed significant assistance.  She presents to the hospital today with progressive weakness which has aggressively increased over the last 24 hours.  The patient has recently had COVID-19, this was diagnosed approximately 10 days ago when she completed a 5-day course of Molnupiravir.  Since that time the patient has just not improved and over the last 24 hours has been difficult to wake up, she has difficulty getting out of bed, full care, not able to get to the bathroom, not taking very much by mouth at all though she will still wake up and answer questions.  She does have DNR orders, the daughter is the historian due to the severe weakness thus a level 5 caveat applies.  Past Medical History:  Diagnosis Date   A-fib (HCC)    Anemia    Aortic valve stenosis    Arthritis    Depression    Gall stones    Gallstone    Low BP    Mitral valve stenosis     Patient Active Problem List   Diagnosis Date Noted   Symptomatic anemia 02/24/2021   Senile purpura (HCC) 12/09/2020   Frail elderly 12/09/2020   Unstable gait 10/25/2020   Paroxysmal atrial fibrillation (HCC) 10/25/2020   Nonrheumatic mitral valve stenosis 10/25/2020   Nonrheumatic aortic valve stenosis 10/25/2020   Fatigue 10/25/2020   Generalized abdominal pain 10/25/2020   Iron  deficiency anemia secondary to inadequate dietary iron intake 10/25/2020   Hypotension 10/25/2020   Chronic combined systolic and diastolic heart failure (HCC) 10/25/2020   Chronic bilateral low back pain without sciatica 10/25/2020   Multiple falls 10/25/2020   Slow transit constipation 10/25/2020   Poor appetite 10/25/2020   Underweight due to inadequate caloric intake 10/25/2020    Past Surgical History:  Procedure Laterality Date   COLONOSCOPY     [place]. Normal   ERCP  09/[facility]   [facility]   FRACTURE SURGERY  2016   Broken Arm   HIP SURGERY  2009   Replacement repaired     MELANOMA EXCISION  2013   right leg   mirtral valve repair  2008   ROTATOR CUFF REPAIR  2016   TOTAL HIP ARTHROPLASTY  2008   VAGINAL HYSTERECTOMY  2005     OB History   No obstetric history on file.     Family History  Problem Relation Age of Onset   COPD Mother    Emphysema Mother    Stroke Father        With complications   Colon cancer Neg Hx    Esophageal cancer Neg Hx    Colon polyps Neg Hx     Social History   Tobacco Use   Smoking status: Never   Smokeless tobacco: Never  Vaping Use  Vaping Use: Never used  Substance Use Topics   Alcohol use: Never   Drug use: Never    Home Medications Prior to Admission medications   Medication Sig Start Date End Date Taking? Authorizing Provider  acetaminophen (TYLENOL) 650 MG CR tablet Take 650 mg by mouth every 12 (twelve) hours as needed for pain.   Yes [provider]  amitriptyline (ELAVIL) 50 MG tablet Take 25 mg by mouth at bedtime.   Yes [provider]  bumetanide (BUMEX) 1 MG tablet Take 1 mg by mouth daily.   Yes [provider]  digoxin (LANOXIN) 0.125 MG tablet Take 1 tablet (0.125 mg total) by mouth daily. 12/26/20  Yes Fargo, Amy E, NP  docusate sodium (COLACE) 100 MG capsule Take 100 mg by mouth daily.   Yes [provider]  ferrous sulfate 325 (65 FE) MG tablet  Take 325 mg by mouth 2 (two) times daily with a meal.   Yes [provider]  lactose free nutrition (BOOST) LIQD Take 237 mLs by mouth 2 (two) times daily between meals.   Yes [provider]  levothyroxine (SYNTHROID) 25 MCG tablet Take 1 tablet (25 mcg total) by mouth daily. 03/17/21  Yes Fargo, Amy E, NP  metoCLOPramide (REGLAN) 5 MG tablet TAKE 1 TABLET BY MOUTH TWICE A DAY Patient taking differently: Take 5 mg by mouth 2 (two) times daily. 01/23/21  Yes Fargo, Amy E, NP  mirtazapine (REMERON) 30 MG tablet Take 1 tablet (30 mg total) by mouth at bedtime. 12/26/20  Yes Fargo, Amy E, NP  molnupiravir EUA (LAGEVRIO) 200 MG CAPS capsule Take 4 capsules by mouth 2 (two) times daily. Start date:04/27/21   Yes [provider]  Multiple Vitamins-Minerals (PRESERVISION AREDS 2+MULTI VIT PO) Take 1 capsule by mouth at bedtime.   Yes [provider]  ondansetron (ZOFRAN) 4 MG tablet Take 4 mg by mouth 3 (three) times daily as needed for nausea or vomiting.   Yes [provider]  pantoprazole (PROTONIX) 40 MG tablet TAKE 1 TABLET BY MOUTH TWICE A DAY Patient taking differently: Take 40 mg by mouth 2 (two) times daily. 02/17/21  Yes Fargo, Amy E, NP  potassium chloride SA (KLOR-CON) 20 MEQ tablet Take 1 tablet (20 mEq total) by mouth daily. 12/16/20  Yes Fargo, Amy E, NP  sennosides-docusate sodium (SENOKOT-S) 8.6-50 MG tablet Take 1 tablet by mouth daily.   Yes [provider]  sildenafil (REVATIO) 20 MG tablet Take 1 tablet (20 mg total) by mouth 2 (two) times daily. 11/11/20  Yes Fargo, Amy E, NP  traMADol (ULTRAM) 50 MG tablet TAKE 1 TABLET (50 MG TOTAL) BY MOUTH 2 (TWO) TIMES DAILY FOR 5 DAYS. Patient taking differently: Take 50 mg by mouth every 12 (twelve) hours as needed for severe pain or moderate pain. 04/26/21  Yes Fargo, Amy E, NP  vitamin B-12 (CYANOCOBALAMIN) 1000 MCG tablet Take 1,000 mcg by mouth daily.   Yes [provider]  vitamin C  (ASCORBIC ACID) 500 MG tablet Take 1 tablet (500 mg total) by mouth 2 (two) times daily. Patient taking differently: Take 500 mg by mouth daily. 01/16/21  Yes Medina-Vargas, Monina C, NP    Allergies    Tape, Morphine and related, and Sulfa antibiotics  Review of Systems   Review of Systems  Unable to perform ROS: Mental status change  Neurological:  Positive for weakness.   Physical Exam Updated Vital Signs BP (!) 108/59   Pulse 85  Temp (!) 97.1 F (36.2 C) (Axillary)   Resp 20   Ht 1.6 m (5\' 3" )   Wt 32.7 kg   SpO2 94%   BMI 12.75 kg/m   Physical Exam Vitals and nursing note reviewed.  Constitutional:      General: She is not in acute distress.    Appearance: She is well-developed.  HENT:     Head: Normocephalic and atraumatic.     Mouth/Throat:     Pharynx: No oropharyngeal exudate.  Eyes:     General: No scleral icterus.       Right eye: No discharge.        Left eye: No discharge.     Conjunctiva/sclera: Conjunctivae normal.     Pupils: Pupils are equal, round, and reactive to light.  Neck:     Thyroid: No thyromegaly.     Vascular: No JVD.  Cardiovascular:     Rate and Rhythm: Normal rate and regular rhythm.     Heart sounds: Normal heart sounds. No murmur heard.   No friction rub. No gallop.  Pulmonary:     Effort: Pulmonary effort is normal. No respiratory distress.     Breath sounds: Rales present. No wheezing.  Abdominal:     General: Bowel sounds are normal. There is no distension.     Palpations: Abdomen is soft. There is no mass.     Tenderness: There is no abdominal tenderness.  Musculoskeletal:        General: No tenderness. Normal range of motion.     Cervical back: Normal range of motion and neck supple.     Comments: The patient is severely cachectic and has diffuse muscle wasting  Lymphadenopathy:     Cervical: No cervical adenopathy.  Skin:    General: Skin is warm and dry.     Findings: No erythema or rash.  Neurological:     Mental  Status: She is alert.     Coordination: Coordination normal.     Comments: The patient has difficult generalized weakness, she can barely move either foot, she can barely squeeze either hand, she can open her eyes and mouth some words but is severely weak.  She cannot sit up at all  Psychiatric:        Behavior: Behavior normal.    ED Results / Procedures / Treatments   Labs (all labs ordered are listed, but only abnormal results are displayed) Labs Reviewed  DIGOXIN LEVEL - Abnormal; Notable for the following components:      Result Value   Digoxin Level 3.3 (*)    All other components within normal limits  CBC WITH DIFFERENTIAL/PLATELET - Abnormal; Notable for the following components:   WBC 11.9 (*)    RBC 2.65 (*)    Hemoglobin 8.4 (*)    HCT 26.0 (*)    Neutro Abs 11.1 (*)    Lymphs Abs 0.3 (*)    Abs Immature Granulocytes 0.10 (*)    All other components within normal limits  COMPREHENSIVE METABOLIC PANEL - Abnormal; Notable for the following components:   Glucose, Bld 65 (*)    BUN 64 (*)    Creatinine, Ser 1.12 (*)    Calcium 8.8 (*)    Albumin 2.0 (*)    Alkaline Phosphatase 145 (*)    GFR, Estimated 46 (*)    All other components within normal limits  D-DIMER, QUANTITATIVE - Abnormal; Notable for the following components:   D-Dimer, Quant 6.43 (*)  All other components within normal limits  LACTATE DEHYDROGENASE - Abnormal; Notable for the following components:   LDH 193 (*)    All other components within normal limits  FIBRINOGEN - Abnormal; Notable for the following components:   Fibrinogen 739 (*)    All other components within normal limits  URINALYSIS, ROUTINE W REFLEX MICROSCOPIC - Abnormal; Notable for the following components:   Specific Gravity, Urine <1.005 (*)    Leukocytes,Ua SMALL (*)    All other components within normal limits  CULTURE, BLOOD (ROUTINE X 2)  CULTURE, BLOOD (ROUTINE X 2)  URINE CULTURE  LACTIC ACID, PLASMA  PROCALCITONIN   TRIGLYCERIDES  URINALYSIS, MICROSCOPIC (REFLEX)  FERRITIN  C-REACTIVE PROTEIN    EKG EKG Interpretation  Date/Time:  Wednesday 05/26/21 17:45:56 EDT Ventricular Rate:  78 PR Interval:  142 QRS Duration: 90 QT Interval:  443 QTC Calculation: 512 R Axis:   -70 Text Interpretation: Sinus rhythm Left anterior fascicular block Repol abnrm suggests ischemia, diffuse leads Prolonged QT interval since last tracing no significant change Confirmed by Eber Hong ([id number]) on 2021/05/26 6:11:54 PM  Radiology DG Chest Port 1 View  Result Date: May 26, 2021 CLINICAL DATA:  Cough, COVID, weakness EXAM: PORTABLE CHEST 1 VIEW COMPARISON:  02/24/2021 FINDINGS: Status post median sternotomy and CABG. Unchanged mild cardiac enlargement. Hyperinflated lungs. Suspect a focal opacity in the right lung base, however evaluation is somewhat limited by overlying wires. No pleural effusion or pneumothorax. Redemonstrated calcified granuloma in the left lower lobe the visualized skeletal structures are unremarkable. IMPRESSION: Suspect focal opacity in the right lung base, which could be infection, aspiration, or atelectasis; however evaluation is somewhat limited by multiple overlying wires. Electronically Signed   By: Wiliam Ke M.D.   On: May 26, 2021 19:50    Procedures .Critical Care Performed by: Eber Hong, MD Authorized by: Eber Hong, MD   Critical care provider statement:    Critical care time (minutes):  35   Critical care time was exclusive of:  Separately billable procedures and treating other patients and teaching time   Critical care was necessary to treat or prevent imminent or life-threatening deterioration of the following conditions:  Toxidrome   Critical care was time spent personally by me on the following activities:  Blood draw for specimens, development of treatment plan with patient or surrogate, discussions with consultants, evaluation of patient's response to treatment,  examination of patient, obtaining history from patient or surrogate, ordering and performing treatments and interventions, ordering and review of laboratory studies, ordering and review of radiographic studies, pulse oximetry, re-evaluation of patient's condition and review of old charts   Medications Ordered in ED Medications  0.9 %  sodium chloride infusion ( Intravenous New Bag/Given 05/26/2021 2024)  cefTRIAXone (ROCEPHIN) 1 g in sodium chloride 0.9 % 100 mL IVPB (0 g Intravenous Stopped 2021-05-26 2156)  dextrose 50 % solution 50 mL (50 mLs Intravenous Given 2021-05-26 2243)    ED Course  I have reviewed the triage vital signs and the nursing notes.  Pertinent labs & imaging results that were available during my care of the patient were reviewed by me and considered in my medical decision making (see chart for details).  Clinical Course as of 05/26/2021 2328  Wed 05-26-2021  2259 Digoxin, Serum(!!): 3.3 [BM]    Clinical Course User Index [BM] Eber Hong, MD   MDM Rules/Calculators/A&P  This patient is severely ill appearing, chronically ill-appearing, elderly at 87 years old and not doing well in a post COVID state.  We will obtain a digoxin level, some basic labs, urinalysis, fluids, will likely need to be admitted to the hospital as she is not able to be cared for by family at home and is severely weak and dehydrated.  Discussed the case with the toxicology service, they agreed that this patient needs to be admitted to the hospital, hydration only as the patient is not having any arrhythmias, recommended hydration only but not Digibind.  Uremia present with a BUN of 64, the patient appears dehydrated and is currently getting saline resuscitation  I discussed the care with Dr. Margo Aye of the hospitalist service who is agreeable to admit the patient to the hospital.  She is currently having no arrhythmias, somnolent  Final Clinical Impression(s) / ED  Diagnoses Final diagnoses:  Poisoning by digoxin, accidental or unintentional, initial encounter  Dehydration  COVID-19     Eber Hong, MD 05/15/2021 2328

## 2021-05-03 NOTE — ED Triage Notes (Signed)
Pt bib ems from home with c/o weakness within 24-48 hours. Pt is usually bed bound but moves with a walker to the restroom. Pt tested COVID + 05/01/21. Hx of 2 valve fails. Pt usually is on room air but transported 4L O2 nasal canula.   BP initially 108/60 en route 150/70 after given 500 ml bolus  HR 90 Capnography 26/27 CBG 93 Room Air initial 87% en route 98% 4L

## 2021-05-03 NOTE — ED Notes (Signed)
Removed pt from O2  to see has pt maintain on room air.

## 2021-05-03 NOTE — ED Notes (Signed)
Pt is at 100% on room air.

## 2021-05-03 NOTE — ED Notes (Signed)
CRITICAL VALUE   CRITICAL VALUE: Digoxin  RECEIVER (on-site recipient of call): Linton Flemings, RN  DATE & TIME NOTIFIED: May 31, 2021 at 22:52  MD NOTIFIED: Dr. Edward Qualia  TIME OF NOTIFICATION: May 31, 2021 at 22:54

## 2021-05-03 NOTE — ED Notes (Signed)
Lab called to follow up on 1900 labs that were collected but not resulted (CBC, CMP, D-dimer). They have just filed the labs to be received and will be running labs now.

## 2021-05-03 NOTE — ED Notes (Signed)
Pt daughter stated she has hx A-Fib not on thinners currently.

## 2021-05-03 NOTE — Progress Notes (Signed)
   This service is provided via telemedicine  No vital signs collected/recorded due to the encounter was a telemedicine visit.   Location of patient (ex: home, work):  Home  Patient consents to a telephone visit: Yes, see telephone visit dated 04/27/21  Location of the provider (ex: office, home):  [facility], Office   Name of any referring provider:  N/A  Names of all persons participating in the telemedicine service and their role in the encounter:  S.Chrae B/CMA, Abbey Chatters, NP, daughter Judeth Cornfield), and Patient   Time spent on call:  6 min with medical assistant

## 2021-05-03 NOTE — Progress Notes (Signed)
Careteam: Patient Care Team: [name], NP as PCP - General (Adult Health Nurse Practitioner) [name], MD as PCP - Cardiology (Cardiology)  Advanced Directive information    Allergies  Allergen Reactions   Tape Other (See Comments)    SKIN TEARS VERY EASILY!!!!   Morphine And Related Nausea And Vomiting   Sulfa Antibiotics Rash    Chief Complaint  Patient presents with   Acute Visit    Covid follow-up. Patient c/o weakness, disoriented, significant decline within the last 24 hours, sleepy, and possibley dehydrated. Visit completed via telephone/video.      HPI: Patient is a 87 y.o. female via virtual visit.  While she was on treatment for COVID she was more lethargic but did not have the cold symptoms.  She was getting out of bed, eating and drinking Daughter reports in the last 24 hours she has become progressively weak. She is moaning.  She was having a lot more pain but now better.  Daughter feels like the COVID is better but increase fatigue and weakness. Daughter has been getting her up but suddenly she has become very weak. 3 days ago they thought about putting her in the car and taking her to PT and now she can hardly get her to sit up.  Daughter reports she is still urinating. Got her up to the bathroom but was difficult.  Took pills with applesauce. Patients daughter is coming tonight.    Daughter has not taken VS.  Review of Systems:  Review of Systems  Unable to perform ROS: Mental status change   Past Medical History:  Diagnosis Date   A-fib (HCC)    Anemia    Aortic valve stenosis    Arthritis    Depression    Gall stones    Gallstone    Low BP    Mitral valve stenosis    Past Surgical History:  Procedure Laterality Date   COLONOSCOPY     [place]. Normal   [facility]   FRACTURE SURGERY  2016   Broken Arm   HIP SURGERY  2009   Replacement repaired     MELANOMA EXCISION  2013    right leg   mirtral valve repair  2008   ROTATOR CUFF REPAIR  2016   TOTAL HIP ARTHROPLASTY  2008   VAGINAL HYSTERECTOMY  2005   Social History:   reports that she has never smoked. She has never used smokeless tobacco. She reports that she does not drink alcohol and does not use drugs.  Family History  Problem Relation Age of Onset   COPD Mother    Emphysema Mother    Stroke Father        With complications   Colon cancer Neg Hx    Esophageal cancer Neg Hx    Colon polyps Neg Hx     Medications: Patient's Medications  New Prescriptions   No medications on file  Previous Medications   ACETAMINOPHEN (TYLENOL) 650 MG CR TABLET    Take 650 mg by mouth every 12 (twelve) hours as needed for pain.   AMITRIPTYLINE (ELAVIL) 50 MG TABLET    Take 25 mg by mouth at bedtime.   BUMETANIDE (BUMEX) 1 MG TABLET    Take 1 mg by mouth daily.   DIGOXIN (LANOXIN) 0.125 MG TABLET    Take 1 tablet (0.125 mg total) by mouth daily.   DOCUSATE SODIUM (COLACE) 100 MG CAPSULE  Take 100 mg by mouth as needed for mild constipation.   FERROUS SULFATE 325 (65 FE) MG TABLET    Take 325 mg by mouth 2 (two) times daily with a meal.   LEVOTHYROXINE (SYNTHROID) 25 MCG TABLET    Take 1 tablet (25 mcg total) by mouth daily.   METOCLOPRAMIDE (REGLAN) 5 MG TABLET    TAKE 1 TABLET BY MOUTH TWICE A DAY   MIRTAZAPINE (REMERON) 30 MG TABLET    Take 1 tablet (30 mg total) by mouth at bedtime.   MULTIPLE VITAMINS-MINERALS (PRESERVISION AREDS 2+MULTI VIT PO)    Take 1 capsule by mouth at bedtime.   ONDANSETRON (ZOFRAN) 4 MG TABLET    Take 4 mg by mouth 3 (three) times daily as needed for nausea or vomiting.   PANTOPRAZOLE (PROTONIX) 40 MG TABLET    TAKE 1 TABLET BY MOUTH TWICE A DAY   POTASSIUM CHLORIDE SA (KLOR-CON) 20 MEQ TABLET    Take 1 tablet (20 mEq total) by mouth daily.   SENNOSIDES-DOCUSATE SODIUM (SENOKOT-S) 8.6-50 MG TABLET    Take 1 tablet by mouth every other day.   SILDENAFIL (REVATIO) 20 MG TABLET    Take 1  tablet (20 mg total) by mouth 2 (two) times daily.   TRAMADOL (ULTRAM) 50 MG TABLET    TAKE 1 TABLET (50 MG TOTAL) BY MOUTH 2 (TWO) TIMES DAILY FOR 5 DAYS.   VITAMIN B-12 (CYANOCOBALAMIN) 1000 MCG TABLET    Take 1,000 mcg by mouth daily.   VITAMIN C (ASCORBIC ACID) 500 MG TABLET    Take 1 tablet (500 mg total) by mouth 2 (two) times daily.  Modified Medications   No medications on file  Discontinued Medications   No medications on file    Physical Exam:  There were no vitals filed for this visit. There is no height or weight on file to calculate BMI. Wt Readings from Last 3 Encounters:  03/16/21 72 lb (32.7 kg)  02/24/21 74 lb 4.7 oz (33.7 kg)  02/23/21 74 lb 3.2 oz (33.7 kg)    Physical Exam Constitutional:      General: She is sleeping.     Appearance: She is underweight.    Labs reviewed: Basic Metabolic Panel: Recent Labs    02/25/21 0418 03/16/21 1137 04/20/21 1144  NA 139 136 137  K 3.9 4.3 3.9  CL 97* 94* 94*  CO2 34* 31 31  GLUCOSE 81 144* 147*  BUN 40* 37* 44*  CREATININE 0.96 0.93 0.83  CALCIUM 8.8* 9.3 9.0  TSH  --  5.65* 1.41   Liver Function Tests: Recent Labs    01/14/21 1902 02/23/21 0000 02/24/21 1407 03/16/21 1137 04/20/21 1144  AST 35   < > 29 36*  36* 26  ALT 15   < > 14 16  16 14   ALKPHOS 55  --  60  --   --   BILITOT 0.7   < > 0.8 0.6  0.6 0.7  PROT 7.2   < > 6.7 7.0  7.0 6.8  ALBUMIN 3.2*  --  3.1*  --   --    < > = values in this interval not displayed.   No results for input(s): LIPASE, AMYLASE in the last 8760 hours. No results for input(s): AMMONIA in the last 8760 hours. CBC: Recent Labs    02/24/21 1407 02/25/21 0418 03/16/21 1137 04/20/21 1144  WBC 5.2 5.0 5.0 8.5  NEUTROABS 4.3  --  3,885 7,047  HGB  6.9* 8.5* 10.1* 9.3*  HCT 22.1* 26.7* 31.1* 29.2*  MCV 107.8* 101.5* 98.7 98.0  PLT 200 167 180 199   Lipid Panel: No results for input(s): CHOL, HDL, LDLCALC, TRIG, CHOLHDL, LDLDIRECT in the last 8760  hours. TSH: Recent Labs    03/16/21 1137 04/20/21 1144  TSH 5.65* 1.41   A1C: No results found for: HGBA1C   Assessment/Plan 1. Lethargy 2. COVID-19 Completed course or molnupiravir, minimal symptoms during COVID except for mild lethargy. Family was able to get her up daily and she was drinking well during COVID.  Since stopping molnupiravir has had significant weakness, increase in fatigue and minimal PO intake with dry membranes.  Advised daughter to take her to ED via EMS for further evaluation and treatment.    Janene Harvey. Biagio Borg  [facility] [phone]    Virtual Visit via video  I connected with patient on 05/29/2021 at  1:00 PM EDT by video via mychart and verified that I am speaking with the correct person using two identifiers.  Location: Patient: home Provider: [facility]   I discussed the limitations, risks, security and privacy concerns of performing an evaluation and management service by telephone and the availability of in person appointments. I also discussed with the patient that there may be a patient responsible charge related to this service. The patient expressed understanding and agreed to proceed.   I discussed the assessment and treatment plan with the patient. The patient was provided an opportunity to ask questions and all were answered. The patient agreed with the plan and demonstrated an understanding of the instructions.   The patient was advised to call back or seek an in-person evaluation if the symptoms worsen or if the condition fails to improve as anticipated.  I provided 18 minutes of non-face-to-face time during this encounter.  Janene Harvey. Biagio Borg Avs printed and mailed

## 2021-05-04 ENCOUNTER — Inpatient Hospital Stay (HOSPITAL_COMMUNITY): Payer: Medicare Other

## 2021-05-04 DIAGNOSIS — R609 Edema, unspecified: Secondary | ICD-10-CM

## 2021-05-04 DIAGNOSIS — Z7189 Other specified counseling: Secondary | ICD-10-CM

## 2021-05-04 DIAGNOSIS — U071 COVID-19: Secondary | ICD-10-CM | POA: Diagnosis not present

## 2021-05-04 DIAGNOSIS — I5042 Chronic combined systolic (congestive) and diastolic (congestive) heart failure: Secondary | ICD-10-CM

## 2021-05-04 DIAGNOSIS — R7889 Finding of other specified substances, not normally found in blood: Secondary | ICD-10-CM

## 2021-05-04 DIAGNOSIS — E860 Dehydration: Secondary | ICD-10-CM | POA: Diagnosis not present

## 2021-05-04 DIAGNOSIS — R531 Weakness: Secondary | ICD-10-CM

## 2021-05-04 DIAGNOSIS — Z66 Do not resuscitate: Secondary | ICD-10-CM

## 2021-05-04 DIAGNOSIS — Z515 Encounter for palliative care: Secondary | ICD-10-CM

## 2021-05-04 DIAGNOSIS — I350 Nonrheumatic aortic (valve) stenosis: Secondary | ICD-10-CM

## 2021-05-04 DIAGNOSIS — J1282 Pneumonia due to coronavirus disease 2019: Secondary | ICD-10-CM

## 2021-05-04 DIAGNOSIS — R54 Age-related physical debility: Secondary | ICD-10-CM

## 2021-05-04 LAB — CBC
HCT: 26.3 % — ABNORMAL LOW (ref 36.0–46.0)
Hemoglobin: 8.3 g/dL — ABNORMAL LOW (ref 12.0–15.0)
MCH: 30.7 pg (ref 26.0–34.0)
MCHC: 31.6 g/dL (ref 30.0–36.0)
MCV: 97.4 fL (ref 80.0–100.0)
Platelets: 250 10*3/uL (ref 150–400)
RBC: 2.7 MIL/uL — ABNORMAL LOW (ref 3.87–5.11)
RDW: 14.5 % (ref 11.5–15.5)
WBC: 12 10*3/uL — ABNORMAL HIGH (ref 4.0–10.5)
nRBC: 0 % (ref 0.0–0.2)

## 2021-05-04 LAB — GLUCOSE, CAPILLARY
Glucose-Capillary: 106 mg/dL — ABNORMAL HIGH (ref 70–99)
Glucose-Capillary: 75 mg/dL (ref 70–99)
Glucose-Capillary: 89 mg/dL (ref 70–99)
Glucose-Capillary: 97 mg/dL (ref 70–99)

## 2021-05-04 LAB — C-REACTIVE PROTEIN: CRP: 41.4 mg/dL — ABNORMAL HIGH (ref <1.0)

## 2021-05-04 LAB — COMPREHENSIVE METABOLIC PANEL
ALT: 18 U/L (ref 0–44)
AST: 38 U/L (ref 15–41)
Albumin: 2 g/dL — ABNORMAL LOW (ref 3.5–5.0)
Alkaline Phosphatase: 131 U/L — ABNORMAL HIGH (ref 38–126)
Anion gap: 12 (ref 5–15)
BUN: 60 mg/dL — ABNORMAL HIGH (ref 8–23)
CO2: 28 mmol/L (ref 22–32)
Calcium: 8.5 mg/dL — ABNORMAL LOW (ref 8.9–10.3)
Chloride: 102 mmol/L (ref 98–111)
Creatinine, Ser: 0.99 mg/dL (ref 0.44–1.00)
GFR, Estimated: 53 mL/min — ABNORMAL LOW (ref >60)
Glucose, Bld: 85 mg/dL (ref 70–99)
Potassium: 4.3 mmol/L (ref 3.5–5.1)
Sodium: 142 mmol/L (ref 135–145)
Total Bilirubin: 1 mg/dL (ref 0.3–1.2)
Total Protein: 6.2 g/dL — ABNORMAL LOW (ref 6.5–8.1)

## 2021-05-04 LAB — MAGNESIUM: Magnesium: 1.5 mg/dL — ABNORMAL LOW (ref 1.7–2.4)

## 2021-05-04 LAB — BRAIN NATRIURETIC PEPTIDE: B Natriuretic Peptide: 563.6 pg/mL — ABNORMAL HIGH (ref 0.0–100.0)

## 2021-05-04 LAB — FERRITIN: Ferritin: 649 ng/mL — ABNORMAL HIGH (ref 11–307)

## 2021-05-04 LAB — CBG MONITORING, ED: Glucose-Capillary: 105 mg/dL — ABNORMAL HIGH (ref 70–99)

## 2021-05-04 LAB — PROCALCITONIN: Procalcitonin: 7 ng/mL

## 2021-05-04 LAB — DIGOXIN LEVEL: Digoxin Level: 2.6 ng/mL (ref 0.8–2.0)

## 2021-05-04 LAB — MRSA NEXT GEN BY PCR, NASAL: MRSA by PCR Next Gen: NOT DETECTED

## 2021-05-04 LAB — PHOSPHORUS: Phosphorus: 4 mg/dL (ref 2.5–4.6)

## 2021-05-04 MED ORDER — SODIUM CHLORIDE 0.9 % IV SOLN
2.0000 g | INTRAVENOUS | Status: DC
  Administered 2021-05-05: 2 g via INTRAVENOUS
  Filled 2021-05-04: qty 20

## 2021-05-04 MED ORDER — AMITRIPTYLINE HCL 25 MG PO TABS
25.0000 mg | ORAL_TABLET | Freq: Every day | ORAL | Status: DC
  Administered 2021-05-04: 25 mg via ORAL
  Filled 2021-05-04 (×2): qty 1

## 2021-05-04 MED ORDER — VITAMIN B-12 1000 MCG PO TABS
1000.0000 ug | ORAL_TABLET | Freq: Every day | ORAL | Status: DC
  Filled 2021-05-04: qty 1

## 2021-05-04 MED ORDER — TRAMADOL HCL 50 MG PO TABS
50.0000 mg | ORAL_TABLET | Freq: Two times a day (BID) | ORAL | Status: DC | PRN
Start: 2021-05-04 — End: 2021-05-05
  Administered 2021-05-04: 50 mg via ORAL
  Filled 2021-05-04: qty 1

## 2021-05-04 MED ORDER — SILDENAFIL CITRATE 20 MG PO TABS
20.0000 mg | ORAL_TABLET | Freq: Two times a day (BID) | ORAL | Status: DC
  Administered 2021-05-04 (×2): 20 mg via ORAL
  Filled 2021-05-04 (×3): qty 1

## 2021-05-04 MED ORDER — LEVOTHYROXINE SODIUM 25 MCG PO TABS
25.0000 ug | ORAL_TABLET | Freq: Every day | ORAL | Status: DC
  Administered 2021-05-04: 25 ug via ORAL
  Filled 2021-05-04: qty 1

## 2021-05-04 MED ORDER — ASCORBIC ACID 500 MG PO TABS
500.0000 mg | ORAL_TABLET | Freq: Every day | ORAL | Status: DC
  Filled 2021-05-04: qty 1

## 2021-05-04 MED ORDER — HEPARIN SODIUM (PORCINE) 5000 UNIT/ML IJ SOLN
5000.0000 [IU] | Freq: Three times a day (TID) | INTRAMUSCULAR | Status: DC
  Administered 2021-05-04 – 2021-05-05 (×3): 5000 [IU] via SUBCUTANEOUS
  Filled 2021-05-04 (×3): qty 1

## 2021-05-04 MED ORDER — DEXTROSE IN LACTATED RINGERS 5 % IV SOLN: INTRAVENOUS | Status: DC

## 2021-05-04 MED ORDER — ENSURE ENLIVE PO LIQD
237.0000 mL | Freq: Two times a day (BID) | ORAL | Status: DC
  Administered 2021-05-04: 237 via ORAL
  Filled 2021-05-04: qty 237

## 2021-05-04 NOTE — ED Notes (Signed)
Daughter at bedside.

## 2021-05-04 NOTE — Consult Note (Signed)
Cardiology Consultation:   Patient ID: Jaclyn Berry MRN: [id number]; DOB: 1927/04/17  Admit date: 05/08/2021 Date of Consult: 05/04/2021  PCP:  [name], NP   [facility] Providers Cardiologist:  [name], MD        Patient Profile:   Jaclyn Berry is a 87 y.o. female with a hx of NICM HFrEF 30-35% presumed tachy-mediated, persistent atrial fibrillation on chronic digoxin therapy not on AC due to falls, severe AS, esophageal dysmotility with failure to thrive who is being seen 05/04/2021 for the evaluation of elevated digoxin levels at the request of Dr Dow Adolph.  History of Present Illness:   Jaclyn Berry was at her baseline status until the 15th of September when she developed COVID pneumonia. Developed respiratory symptoms and weakness, poor PO intake, have somewhat worsened over the past few days precipitating patient being brought to the ED. Procalcitonin level > 7, CXR with RLL infiltrates, treated with rocephin for presumed superimposed bacterial pneumonia. Labwork notable for dig level of 3.3, K 4.8, Cr 1.1 b/l 0.8, BUN 60s b/l 30s-40s. EKG in atrial fibrillation with stable anterolateral ST depressions from prior. Telemetry with occasional aberrant complexes which look to be predominantly Ashman beats and not PVCs. Patient denies any change in neuro status or any GI symptoms aside from difficulty eating over the past year somewhat worsened with COVID. Her daughter helps her with medications but patient believes she has not taken any extra doses. She has continued to take her home bumex. Given 1L IVFs, cardiology consulted   Past Medical History:  Diagnosis Date   A-fib (HCC)    Anemia    Aortic valve stenosis    Arthritis    Depression    Gall stones    Gallstone    Low BP    Mitral valve stenosis     Past Surgical History:  Procedure Laterality Date   COLONOSCOPY     [place]. Normal   [facility]CP  [facility]   FRACTURE  SURGERY  2016   Broken Arm   HIP SURGERY  2009   Replacement repaired     MELANOMA EXCISION  2013   right leg   mirtral valve repair  2008   ROTATOR CUFF REPAIR  2016   TOTAL HIP ARTHROPLASTY  2008   VAGINAL HYSTERECTOMY  2005     Home Medications:  Prior to Admission medications   Medication Sig Start Date End Date Taking? Authorizing Provider  acetaminophen (TYLENOL) 650 MG CR tablet Take 650 mg by mouth every 12 (twelve) hours as needed for pain.   Yes [provider]  amitriptyline (ELAVIL) 50 MG tablet Take 25 mg by mouth at bedtime.   Yes [provider]  bumetanide (BUMEX) 1 MG tablet Take 1 mg by mouth daily.   Yes [provider]  digoxin (LANOXIN) 0.125 MG tablet Take 1 tablet (0.125 mg total) by mouth daily. 12/26/20  Yes Fargo, Amy E, NP  docusate sodium (COLACE) 100 MG capsule Take 100 mg by mouth daily.   Yes [provider]  ferrous sulfate 325 (65 FE) MG tablet Take 325 mg by mouth 2 (two) times daily with a meal.   Yes [provider]  lactose free nutrition (BOOST) LIQD Take 237 mLs by mouth 2 (two) times daily between meals.   Yes [provider]  levothyroxine (SYNTHROID) 25 MCG tablet Take 1 tablet (25 mcg total) by mouth daily. 03/17/21  Yes [name], NP  metoCLOPramide (REGLAN) 5 MG tablet TAKE 1 TABLET BY MOUTH TWICE A DAY Patient taking differently: Take 5 mg by mouth 2 (two) times daily. 01/23/21  Yes Fargo, Amy E, NP  mirtazapine (REMERON) 30 MG tablet Take 1 tablet (30 mg total) by mouth at bedtime. 12/26/20  Yes Fargo, Amy E, NP  molnupiravir EUA (LAGEVRIO) 200 MG CAPS capsule Take 4 capsules by mouth 2 (two) times daily. Start date:04/27/21   Yes [provider]  Multiple Vitamins-Minerals (PRESERVISION AREDS 2+MULTI VIT PO) Take 1 capsule by mouth at bedtime.   Yes [provider]  ondansetron (ZOFRAN) 4 MG tablet Take 4 mg by mouth 3 (three) times daily as needed for nausea or vomiting.    Yes [provider]  pantoprazole (PROTONIX) 40 MG tablet TAKE 1 TABLET BY MOUTH TWICE A DAY Patient taking differently: Take 40 mg by mouth 2 (two) times daily. 02/17/21  Yes Fargo, Amy E, NP  potassium chloride SA (KLOR-CON) 20 MEQ tablet Take 1 tablet (20 mEq total) by mouth daily. 12/16/20  Yes Fargo, Amy E, NP  sennosides-docusate sodium (SENOKOT-S) 8.6-50 MG tablet Take 1 tablet by mouth daily.   Yes [provider]  sildenafil (REVATIO) 20 MG tablet Take 1 tablet (20 mg total) by mouth 2 (two) times daily. 11/11/20  Yes Fargo, Amy E, NP  traMADol (ULTRAM) 50 MG tablet TAKE 1 TABLET (50 MG TOTAL) BY MOUTH 2 (TWO) TIMES DAILY FOR 5 DAYS. Patient taking differently: Take 50 mg by mouth every 12 (twelve) hours as needed for severe pain or moderate pain. 04/26/21  Yes Fargo, Amy E, NP  vitamin B-12 (CYANOCOBALAMIN) 1000 MCG tablet Take 1,000 mcg by mouth daily.   Yes [provider]  vitamin C (ASCORBIC ACID) 500 MG tablet Take 1 tablet (500 mg total) by mouth 2 (two) times daily. Patient taking differently: Take 500 mg by mouth daily. 01/16/21  Yes Medina-Vargas, Monina C, NP    Inpatient Medications: Scheduled Meds:  amitriptyline  25 mg Oral QHS   vitamin C  500 mg Oral Daily   feeding supplement  237 mL Oral BID BM   heparin  5,000 Units Subcutaneous Q8H   levothyroxine  25 mcg Oral Daily   sildenafil  20 mg Oral BID   vitamin B-12  1,000 mcg Oral Daily   Continuous Infusions:  cefTRIAXone (ROCEPHIN)  IV     dextrose 5% lactated ringers 25 mL/hr at 05/04/21 0145   PRN Meds: traMADol  Allergies:    Allergies  Allergen Reactions   Tape Other (See Comments)    SKIN TEARS VERY EASILY!!!!   Morphine And Related Nausea And Vomiting   Sulfa Antibiotics Rash    Social History:   Social History   Socioeconomic History   Marital status: Widowed    Spouse name: Not on file   Number of children: Not on file   Years of education: Not on file   Highest  education level: Not on file  Occupational History   Not on file  Tobacco Use   Smoking status: Never   Smokeless tobacco: Never  Vaping Use   Vaping Use: Never used  Substance and Sexual Activity   Alcohol use: Never   Drug use: Never   Sexual activity: Not Currently  Other Topics Concern   Not on file  Social History Narrative   Diet: Regular      Do you drink/ eat things with caffeine? No always drank coffee but not now  Marital status:  Widowed                              What year were you married ? 1954      Do you live in a house, apartment,assistred living, condo, trailer, etc.)? House      Is it one or more stories? 2      How many persons live in your home ? 4      Do you have any pets in your home ?(please list) Dog, Cat      Highest Level of education completed: High School      Current or past profession: Book Publishing rights manager       Do you exercise?  No                            Type & how often ------------      ADVANCED DIRECTIVES (Please bring copies)      Do you have a living will? Yes      Do you have a DNR form? Yes                      If not, do you want to discuss one?       Do you have signed POA?HPOA forms?   Yes              If so, please bring to your appointment      FUNCTIONAL STATUS- To be completed by Spouse / child / Staff       Do you have difficulty bathing or dressing yourself ? Yes      Do you have difficulty preparing food or eating ? Yes      Do you have difficulty managing your mediation ? Yes      Do you have difficulty managing your finances ? Yes      Do you have difficulty affording your medication ? No      Social Determinants of Corporate investment banker Strain: Not on file  Food Insecurity: Not on file  Transportation Needs: Not on file  Physical Activity: Not on file  Stress: Not on file  Social Connections: Not on file  Intimate Partner Violence: Not on file    Family History:     Family History  Problem Relation Age of Onset   COPD Mother    Emphysema Mother    Stroke Father        With complications   Colon cancer Neg Hx    Esophageal cancer Neg Hx    Colon polyps Neg Hx      ROS:  Please see the history of present illness.   All other ROS reviewed and negative.     Physical Exam/Data:   Vitals:   05/04/21 0030 05/04/21 0147 05/04/21 0200 05/04/21 0215  BP: 109/72 107/90 (!) 111/56 112/67  Pulse: (!) 107 81  85  Resp: 19 (!) 24 19 18   Temp:  (!) 97.4 F (36.3 C)    TempSrc:  Rectal    SpO2: 93% 99%  100%  Weight:      Height:       No intake or output data in the 24 hours ending 05/04/21 0237 Last 3 Weights 04/26/2021 03/16/2021 02/24/2021  Weight (lbs) 72 lb 72 lb 74 lb 4.7 oz  Weight (kg) 32.659  kg 32.659 kg 33.7 kg     Body mass index is 12.75 kg/m.  General:  cachectic, in no acute distress. Fatigued HEENT: normal Neck: no JVD Vascular: No carotid bruits; Distal pulses 2+ bilaterally Cardiac:  normal S1, S2; irregular; no murmur  Lungs:  clear to auscultation bilaterally, no wheezing, rhonchi or rales  Abd: soft, nontender, no hepatomegaly  Ext: no edema Musculoskeletal:  No deformities, BUE and BLE strength normal and equal Skin: warm and dry, skin tenting  Neuro:  CNs 2-12 intact, no focal abnormalities noted Psych:  Normal affect   EKG:  The EKG was personally reviewed and demonstrates:  afib, stable anterolateral ST depressions Telemetry:  Telemetry was personally reviewed and demonstrates:  afib Ashman beats intermittently (favor over PVCs)  Relevant CV Studies:  Laboratory Data:  High Sensitivity Troponin:  No results for input(s): TROPONINIHS in the last 720 hours.   Chemistry Recent Labs  Lab 06-01-2021 1903  NA 139  K 4.8  CL 98  CO2 29  GLUCOSE 65*  BUN 64*  CREATININE 1.12*  CALCIUM 8.8*  GFRNONAA 46*  ANIONGAP 12    Recent Labs  Lab June 01, 2021 1903  PROT 6.7  ALBUMIN 2.0*  AST 40  ALT 18  ALKPHOS  145*  BILITOT 1.0   Lipids  Recent Labs  Lab June 01, 2021 1903  TRIG 131    Hematology Recent Labs  Lab 2021/06/01 1903  WBC 11.9*  RBC 2.65*  HGB 8.4*  HCT 26.0*  MCV 98.1  MCH 31.7  MCHC 32.3  RDW 14.5  PLT 258   Thyroid No results for input(s): TSH, FREET4 in the last 168 hours.  BNPNo results for input(s): BNP, PROBNP in the last 168 hours.  DDimer  Recent Labs  Lab 06/01/2021 1903  DDIMER 6.43*     Radiology/Studies:  DG Chest Port 1 View  Result Date: Jun 01, 2021 CLINICAL DATA:  Cough, COVID, weakness EXAM: PORTABLE CHEST 1 VIEW COMPARISON:  02/24/2021 FINDINGS: Status post median sternotomy and CABG. Unchanged mild cardiac enlargement. Hyperinflated lungs. Suspect a focal opacity in the right lung base, however evaluation is somewhat limited by overlying wires. No pleural effusion or pneumothorax. Redemonstrated calcified granuloma in the left lower lobe the visualized skeletal structures are unremarkable. IMPRESSION: Suspect focal opacity in the right lung base, which could be infection, aspiration, or atelectasis; however evaluation is somewhat limited by multiple overlying wires. Electronically Signed   By: Wiliam Ke M.D.   On: 06-01-2021 19:50     Assessment and Plan:   Supratherapeutic digoxin levels: goal dig level 0.5-0.8; patient at 3.3. Suspect this is due to worsening renal function due to volume depletion, poor PO intake, and recent COVId PNA with possible superimposed bacterial PNA. I do not see convincing signs of true digoxin toxicity at this time (neuro symptoms, GI symptoms, or cardiac dysrhythmias), but the patient is certainly fatigued. Okay to hold on digibind for now but if renal function continues to worsen and K > 5 and dig levels stay steady on repeat would consider giving 1 vial of digibind. Worsening neurologic status, GI symptoms, or dysrhythmias would also trigger giving digibind. Would need to be cautious given renal function and weight of only  33 kg AKI: Patient still appears dry, consider further fluid resuscitation guided by change in renal function Afib: hold digoxin. Hold home potassium supplementation. CHADSVASC of 4 but hold on AC since not on in the outpatinet setting due to GOC HF, severe AS: home home bumex given volume  status   Risk Assessment/Risk Scores:        New York Heart Association (NYHA) Functional Class NYHA Class II  CHA2DS2-VASc Score = 4   This indicates a 4.8% annual risk of stroke. The patient's score is based upon: CHF History: 1 HTN History: 0 Diabetes History: 0 Stroke History: 0 Vascular Disease History: 0 Age Score: 2 Gender Score: 1         For questions or updates, please contact [facility] Please consult [email or web address] for contact info under    Signed, [place] Gregor Dershem, MD  05/04/2021 2:37 AM

## 2021-05-04 NOTE — ED Notes (Signed)
Attending made aware pt failed her swallow exam & a Speech swallow eval was ordered.

## 2021-05-04 NOTE — ED Notes (Signed)
SLP was contacted in regards to pt's order & they said they will "be out soon."

## 2021-05-04 NOTE — ED Notes (Signed)
CBG 75. °

## 2021-05-04 NOTE — ED Notes (Signed)
Family at bedside updated.  

## 2021-05-04 NOTE — ED Notes (Signed)
Critical Digoxin level is 2.6 at this time. Secure chat to admitting and cardiology of results at this time.

## 2021-05-04 NOTE — Evaluation (Signed)
Clinical/Bedside Swallow Evaluation Patient Details  Name: Jaclyn Berry MRN: [id number] Date of Birth: May 30, 1927  Today's Date: 05/04/2021 Time: SLP Start Time (ACUTE ONLY): 1426 SLP Stop Time (ACUTE ONLY): 1502 SLP Time Calculation (min) (ACUTE ONLY): 36 min  Past Medical History:  Past Medical History:  Diagnosis Date   A-fib (HCC)    Anemia    Aortic valve stenosis    Arthritis    Depression    Gall stones    Gallstone    Low BP    Mitral valve stenosis    Past Surgical History:  Past Surgical History:  Procedure Laterality Date   COLONOSCOPY     [place] Normal   ERCP  09/[facility]   [facility]   FRACTURE SURGERY  2016   Broken Arm   HIP SURGERY  2009   Replacement repaired     MELANOMA EXCISION  2013   right leg   mirtral valve repair  2008   ROTATOR CUFF REPAIR  2016   TOTAL HIP ARTHROPLASTY  2008   VAGINAL HYSTERECTOMY  2005   HPI:  87 y.o. female with medical history significant for paroxysmal A. fib on digoxin, chronic combined diastolic and systolic CHF EF 30 to 35%, moderate to severe tricuspid valve regurgitation, severe aortic valve stenosis, iron deficiency anemia, hypothyroidism, chronic anxiety/depression, failure to thrive in an adult, esophageal dysmotility, GERD, recent COVID-19 viral infection treated with antivirals, who presented to [facility] ED from home due to worsening generalized weakness for the past 24 to 48 hours. Dx bacterial RLL pna, failure to thrive in an adult/severe protein calorie malnutrition. 12/02/20 EGD showed esophageal motility disorder with extensive tertiary contractions. Her daughters, who are at bedside, report significant weight loss, early satiety. She drinks two boosts/day, which can take quite a while for her to get down.    Assessment / Plan / Recommendation  Clinical Impression  Pt presents with s/s of a primary esophageal dysphagia.  Prior to assessment, oral care was provided, removing thick dry sputum from  oral cavity and palate.  Once mouth was clean of debris, pt consumed sips of water and bites of applesauce with no overt s/s of aspiration. She confirmed feeling full after only a few bites of applesauce.  PTA she had been drinking/eating very little, especially in the days preceding her arrival in the ED.  We reviewed the impact of dysmotility on functional eating and talked about strategies to help with intake.  We talked about the inherent risk of aspiration due to potential backflow from esophagus/spill-over into airway.  Her dtrs verbalized understanding.  Recommend starting with a full liquid diet; allow pt to sip liquids slowly at her pace. Sit upright while taking POs and for 45 min after; keep HOB elevated while sleeping. Crush meds when large.  SLP will follow briefly for education/safety.  D/W RN. SLP Visit Diagnosis: Dysphagia, unspecified (R13.10)           Diet Recommendation   Full liquids  Medication Administration: Crushed with puree    Other  Recommendations Oral Care Recommendations: Oral care BID    Recommendations for follow up therapy are one component of a multi-disciplinary discharge planning process, led by the attending physician.  Recommendations may be updated based on patient status, additional functional criteria and insurance authorization.  Follow up Recommendations None      Frequency and Duration min 2x/week  1 week       Prognosis Prognosis for Safe Diet Advancement: Guarded  Swallow Study   General HPI: 87 y.o. female with medical history significant for paroxysmal A. fib on digoxin, chronic combined diastolic and systolic CHF EF 30 to 35%, moderate to severe tricuspid valve regurgitation, severe aortic valve stenosis, iron deficiency anemia, hypothyroidism, chronic anxiety/depression, failure to thrive in an adult, esophageal dysmotility, GERD, recent COVID-19 viral infection treated with antivirals, who presented to [facility] ED from home due to  worsening generalized weakness for the past 24 to 48 hours. Dx bacterial RLL pna, failure to thrive in an adult/severe protein calorie malnutrition. 12/02/20 EGD showed esophageal motility disorder with extensive tertiary contractions. Her daughters, who are at bedside, report significant weight loss, early satiety. She drinks two boosts/day, which can take quite a while for her to get down. Type of Study: Bedside Swallow Evaluation Previous Swallow Assessment: no Diet Prior to this Study: NPO Temperature Spikes Noted: No History of Recent Intubation: No Behavior/Cognition: Alert Oral Cavity Assessment: Dried secretions;Excessive secretions Oral Care Completed by SLP: Yes Oral Cavity - Dentition: Adequate natural dentition Self-Feeding Abilities: Needs assist Patient Positioning: Upright in bed Baseline Vocal Quality: Low vocal intensity Volitional Cough: Weak Volitional Swallow: Able to elicit    Oral/Motor/Sensory Function Overall Oral Motor/Sensory Function: Within functional limits   Ice Chips Ice chips: Within functional limits   Thin Liquid Thin Liquid: Within functional limits    Nectar Thick Nectar Thick Liquid: Not tested   Honey Thick Honey Thick Liquid: Not tested   Puree Puree: Within functional limits   Solid     Solid: Not tested      [name] 05/04/2021,3:16 PM [name], MA CCC/SLP Acute Rehabilitation Services Office number [phone] Pager [phone]

## 2021-05-04 NOTE — Progress Notes (Signed)
DAILY PROGRESS NOTE   Patient Name: Jaclyn Berry Date of Encounter: 05/04/2021 Cardiologist: [name], MD  Chief Complaint   "I feel terrible"  Patient Profile   Jaclyn Berry is a 87 y.o. female with a hx of NICM HFrEF 30-35% presumed tachy-mediated, persistent atrial fibrillation on chronic digoxin therapy not on AC due to falls, severe AS, esophageal dysmotility with failure to thrive who is being seen 05/04/2021 for the evaluation of elevated digoxin levels at the request of Dr Dow Adolph.  Subjective   Jaclyn Berry reports that she feels terrible today.  Was noted to be coughing, appears somewhat lethargic and short of breath.  Overnight consultation note was thoroughly reviewed.  She is quite ill given a COVID-pneumonia with high procalcitonin level, but also noted to have frequent PVCs and a very high digoxin level of 3.3.  This has come down to 2.6 today.  Fortunately her creatinine seems to have improved somewhat.  Telemetry shows frequent PVCs that are multifocal however heart rate is still in the 80s to 90s.  Potassium is normal at 4.3.  Objective   Vitals:   05/04/21 0830 05/04/21 0845 05/04/21 0932 05/04/21 0936  BP: 107/87 (!) 112/100 90/63   Pulse: (!) 104 65 82 82  Resp: (!) 25 (!) 44 (!) 36 (!) 49  Temp:      TempSrc:      SpO2: 93% 96% 90% 90%  Weight:      Height:        Intake/Output Summary (Last 24 hours) at 05/04/2021 1019 Last data filed at 05/04/2021 0508 Gross per 24 hour  Intake --  Output 250 ml  Net -250 ml   Filed Weights   05-16-21 1804  Weight: 32.7 kg    Physical Exam   General appearance: alert, cachectic, moderate distress, and toxic Neck: no carotid bruit, no JVD, and thyroid not enlarged, symmetric, no tenderness/mass/nodules Lungs: diminished breath sounds bilaterally, rhonchi bilaterally, and wheezes and expiratory Heart: irregularly irregular rhythm and systolic murmur: systolic ejection 3/6, crescendo at 2nd  right intercostal space Abdomen: soft, non-tender; bowel sounds normal; no masses,  no organomegaly and scaphoid Extremities: extremities normal, atraumatic, no cyanosis or edema Pulses: 2+ and symmetric Skin: Skin color, texture, turgor normal. No rashes or lesions Neurologic: Mental status: Awakens to vocal command but appears somnolent Psych:Cannot assess  Inpatient Medications    Scheduled Meds:  amitriptyline  25 mg Oral QHS   vitamin C  500 mg Oral Daily   feeding supplement  237 mL Oral BID BM   heparin  5,000 Units Subcutaneous Q8H   levothyroxine  25 mcg Oral Daily   sildenafil  20 mg Oral BID   vitamin B-12  1,000 mcg Oral Daily    Continuous Infusions:  cefTRIAXone (ROCEPHIN)  IV     dextrose 5% lactated ringers 25 mL/hr at 05/04/21 0145    PRN Meds: traMADol   Labs   Results for orders placed or performed during the hospital encounter of 05-16-2021 (from the past 48 hour(s))  Digoxin level     Status: Abnormal   Collection Time: 2021/05/16  7:03 PM  Result Value Ref Range   Digoxin Level 3.3 (HH) 0.8 - 2.0 ng/mL    Comment: CRITICAL RESULT CALLED TO, READ BACK BY AND VERIFIED WITH: Stana Bunting 05/16/2021 2251 WAYK Performed at [facility], [address], [place], [place] [address]   Lactic acid, plasma     Status: None   Collection  Time: 04/28/2021  7:03 PM  Result Value Ref Range   Lactic Acid, Venous 1.8 0.5 - 1.9 mmol/L    Comment: Performed at [facility], [address] [place], [place] [address]  CBC WITH DIFFERENTIAL     Status: Abnormal   Collection Time: 04/20/2021  7:03 PM  Result Value Ref Range   WBC 11.9 (H) 4.0 - 10.5 K/uL   RBC 2.65 (L) 3.87 - 5.11 MIL/uL   Hemoglobin 8.4 (L) 12.0 - 15.0 g/dL   HCT 07.3 (L) 71.0 - 62.6 %   MCV 98.1 80.0 - 100.0 fL   MCH 31.7 26.0 - 34.0 pg   MCHC 32.3 30.0 - 36.0 g/dL   RDW 94.8 54.6 - 27.0 %   Platelets 258 150 - 400 K/uL    Comment: REPEATED TO VERIFY   nRBC 0.0 0.0 - 0.2 %   Neutrophils  Relative % 93 %   Neutro Abs 11.1 (H) 1.7 - 7.7 K/uL   Lymphocytes Relative 3 %   Lymphs Abs 0.3 (L) 0.7 - 4.0 K/uL   Monocytes Relative 3 %   Monocytes Absolute 0.3 0.1 - 1.0 K/uL   Eosinophils Relative 0 %   Eosinophils Absolute 0.0 0.0 - 0.5 K/uL   Basophils Relative 0 %   Basophils Absolute 0.0 0.0 - 0.1 K/uL   Immature Granulocytes 1 %   Abs Immature Granulocytes 0.10 (H) 0.00 - 0.07 K/uL    Comment: Performed at [facility], [address] [place], [place] [address]  Comprehensive metabolic panel     Status: Abnormal   Collection Time: 05/02/2021  7:03 PM  Result Value Ref Range   Sodium 139 135 - 145 mmol/L   Potassium 4.8 3.5 - 5.1 mmol/L   Chloride 98 98 - 111 mmol/L   CO2 29 22 - 32 mmol/L   Glucose, Bld 65 (L) 70 - 99 mg/dL    Comment: Glucose reference range applies only to samples taken after fasting for at least 8 hours.   BUN 64 (H) 8 - 23 mg/dL   Creatinine, Ser 3.81 (H) 0.44 - 1.00 mg/dL   Calcium 8.8 (L) 8.9 - 10.3 mg/dL   Total Protein 6.7 6.5 - 8.1 g/dL   Albumin 2.0 (L) 3.5 - 5.0 g/dL   AST 40 15 - 41 U/L   ALT 18 0 - 44 U/L   Alkaline Phosphatase 145 (H) 38 - 126 U/L   Total Bilirubin 1.0 0.3 - 1.2 mg/dL   GFR, Estimated 46 (L) >60 mL/min    Comment: (NOTE) Calculated using the CKD-EPI Creatinine Equation (2021)    Anion gap 12 5 - 15    Comment: Performed at [facility], [address], [place], [place] [address]  D-dimer, quantitative     Status: Abnormal   Collection Time: 05/06/2021  7:03 PM  Result Value Ref Range   D-Dimer, Quant 6.43 (H) 0.00 - 0.50 ug/mL-FEU    Comment: (NOTE) At the manufacturer cut-off value of 0.5 g/mL FEU, this assay has a negative predictive value of 95-100%.This assay is intended for use in conjunction with a clinical pretest probability (PTP) assessment model to exclude pulmonary embolism (PE) and deep venous thrombosis (DVT) in outpatients suspected of PE or DVT. Results should be correlated with  clinical presentation. Performed at [facility], [address], [place], [place] [address]   Procalcitonin     Status: None   Collection Time: 05/11/2021  7:03 PM  Result Value Ref Range   Procalcitonin 7.40 ng/mL    Comment:        Interpretation: PCT > 2 ng/mL: Systemic infection (sepsis) is likely, unless other causes are known. (NOTE)       Sepsis PCT Algorithm           Lower Respiratory Tract                                      Infection PCT Algorithm    ----------------------------     ----------------------------         PCT < 0.25 ng/mL                PCT < 0.10 ng/mL          Strongly encourage             Strongly discourage   discontinuation of antibiotics    initiation of antibiotics    ----------------------------     -----------------------------       PCT 0.25 - 0.50 ng/mL            PCT 0.10 - 0.25 ng/mL               OR       >80% decrease in PCT            Discourage initiation of                                            antibiotics      Encourage discontinuation           of antibiotics    ----------------------------     -----------------------------         PCT >= 0.50 ng/mL              PCT 0.26 - 0.50 ng/mL               AND       <80% decrease in PCT              Encourage initiation of                                             antibiotics       Encourage continuation           of antibiotics    ----------------------------     -----------------------------        PCT >= 0.50 ng/mL                  PCT > 0.50 ng/mL               AND         increase in PCT                  Strongly encourage                                      initiation of antibiotics    Strongly encourage escalation           of  antibiotics                                     -----------------------------                                           PCT <= 0.25 ng/mL                                                 OR                                        > 80% decrease  in PCT                                      Discontinue / Do not initiate                                             antibiotics  Performed at [facility], [address] [place], [place] [address]   Lactate dehydrogenase     Status: Abnormal   Collection Time: 04/13/2021  7:03 PM  Result Value Ref Range   LDH 193 (H) 98 - 192 U/L    Comment: Performed at [facility], [address] [place], [place] [address]  Ferritin     Status: Abnormal   Collection Time: 04/18/2021  7:03 PM  Result Value Ref Range   Ferritin 649 (H) 11 - 307 ng/mL    Comment: Performed at [facility], [address] [place], [place] [address]  Triglycerides     Status: None   Collection Time: 04/23/2021  7:03 PM  Result Value Ref Range   Triglycerides 131 <150 mg/dL    Comment: Performed at [facility], [address], [place], [place] [address]  Fibrinogen     Status: Abnormal   Collection Time: 05/10/2021  7:03 PM  Result Value Ref Range   Fibrinogen 739 (H) 210 - 475 mg/dL    Comment: (NOTE) Fibrinogen results may be underestimated in patients receiving thrombolytic therapy. Performed at [facility], [address], [place], [place] [address]   C-reactive protein     Status: Abnormal   Collection Time: 04/20/2021  7:03 PM  Result Value Ref Range   CRP 41.4 (H) <1.0 mg/dL    Comment: Performed at [facility], [address] [place], [place] [address]  Brain natriuretic peptide     Status: Abnormal   Collection Time: 04/27/2021  7:03 PM  Result Value Ref Range   B Natriuretic Peptide 563.6 (H) 0.0 - 100.0 pg/mL    Comment: Performed at [facility], [address] [place], [place] [address]  Urinalysis, Routine w reflex microscopic Urine, Clean Catch     Status: Abnormal   Collection Time: 04/14/2021  9:18 PM  Result  Value Ref Range   Color, Urine YELLOW YELLOW   APPearance CLEAR CLEAR   Specific Gravity, Urine <1.005 (L) 1.005 - 1.030   pH 6.0 5.0 - 8.0    Glucose, UA NEGATIVE NEGATIVE mg/dL   Hgb urine dipstick NEGATIVE NEGATIVE   Bilirubin Urine NEGATIVE NEGATIVE   Ketones, ur NEGATIVE NEGATIVE mg/dL   Protein, ur NEGATIVE NEGATIVE mg/dL   Nitrite NEGATIVE NEGATIVE   Leukocytes,Ua SMALL (A) NEGATIVE    Comment: Performed at [facility], [address], [place], [place] [address]  Urinalysis, Microscopic (reflex)     Status: None   Collection Time: May 14, 2021  9:18 PM  Result Value Ref Range   RBC / HPF 0-5 0 - 5 RBC/hpf   WBC, UA 0-5 0 - 5 WBC/hpf   Bacteria, UA NONE SEEN NONE SEEN   Squamous Epithelial / LPF 0-5 0 - 5   Mucus PRESENT     Comment: Performed at [facility], [address] [place], [place] [address]  MRSA Next Gen by PCR, Nasal     Status: None   Collection Time: 05/04/21 12:34 AM   Specimen: Nasal Mucosa; Nasal Swab  Result Value Ref Range   MRSA by PCR Next Gen NOT DETECTED NOT DETECTED    Comment: (NOTE) The GeneXpert MRSA Assay (FDA approved for NASAL specimens only), is one component of a comprehensive MRSA colonization surveillance program. It is not intended to diagnose MRSA infection nor to guide or monitor treatment for MRSA infections. Test performance is not FDA approved in patients less than 52 years old. Performed at [facility], [address], [place], [place] [address]   CBG monitoring, ED     Status: Abnormal   Collection Time: 05/04/21 12:34 AM  Result Value Ref Range   Glucose-Capillary 105 (H) 70 - 99 mg/dL    Comment: Glucose reference range applies only to samples taken after fasting for at least 8 hours.  Digoxin level     Status: Abnormal   Collection Time: 05/04/21  3:00 AM  Result Value Ref Range   Digoxin Level 2.6 (HH) 0.8 - 2.0 ng/mL    Comment: CRITICAL RESULT CALLED TO, READ BACK BY AND VERIFIED WITH: Lavona Mound, RN. @0412  BLANKENSHIP R. Performed at [facility], [address] [place], [place]   Procalcitonin - Baseline      Status: None   Collection Time: 05/04/21  4:25 AM  Result Value Ref Range   Procalcitonin 7.00 ng/mL    Comment:        Interpretation: PCT > 2 ng/mL: Systemic infection (sepsis) is likely, unless other causes are known. (NOTE)       Sepsis PCT Algorithm           Lower Respiratory Tract                                      Infection PCT Algorithm    ----------------------------     ----------------------------         PCT < 0.25 ng/mL                PCT < 0.10 ng/mL          Strongly encourage             Strongly discourage   discontinuation of antibiotics    initiation of antibiotics    ----------------------------     -----------------------------  PCT 0.25 - 0.50 ng/mL            PCT 0.10 - 0.25 ng/mL               OR       >80% decrease in PCT            Discourage initiation of                                            antibiotics      Encourage discontinuation           of antibiotics    ----------------------------     -----------------------------         PCT >= 0.50 ng/mL              PCT 0.26 - 0.50 ng/mL               AND       <80% decrease in PCT              Encourage initiation of                                             antibiotics       Encourage continuation           of antibiotics    ----------------------------     -----------------------------        PCT >= 0.50 ng/mL                  PCT > 0.50 ng/mL               AND         increase in PCT                  Strongly encourage                                      initiation of antibiotics    Strongly encourage escalation           of antibiotics                                     -----------------------------                                           PCT <= 0.25 ng/mL                                                 OR                                        > 80% decrease in PCT  Discontinue / Do not initiate                                              antibiotics  Performed at [facility], [address] [place], [place] [address]   CBC     Status: Abnormal   Collection Time: 05/04/21  4:25 AM  Result Value Ref Range   WBC 12.0 (H) 4.0 - 10.5 K/uL   RBC 2.70 (L) 3.87 - 5.11 MIL/uL   Hemoglobin 8.3 (L) 12.0 - 15.0 g/dL   HCT 46.5 (L) 03.5 - 46.5 %   MCV 97.4 80.0 - 100.0 fL   MCH 30.7 26.0 - 34.0 pg   MCHC 31.6 30.0 - 36.0 g/dL   RDW 68.1 27.5 - 17.0 %   Platelets 250 150 - 400 K/uL   nRBC 0.0 0.0 - 0.2 %    Comment: Performed at [facility], [address], [place], [place] [address]  Comprehensive metabolic panel     Status: Abnormal   Collection Time: 05/04/21  4:25 AM  Result Value Ref Range   Sodium 142 135 - 145 mmol/L   Potassium 4.3 3.5 - 5.1 mmol/L   Chloride 102 98 - 111 mmol/L   CO2 28 22 - 32 mmol/L   Glucose, Bld 85 70 - 99 mg/dL    Comment: Glucose reference range applies only to samples taken after fasting for at least 8 hours.   BUN 60 (H) 8 - 23 mg/dL   Creatinine, Ser 4.49 0.44 - 1.00 mg/dL   Calcium 8.5 (L) 8.9 - 10.3 mg/dL   Total Protein 6.2 (L) 6.5 - 8.1 g/dL   Albumin 2.0 (L) 3.5 - 5.0 g/dL   AST 38 15 - 41 U/L   ALT 18 0 - 44 U/L   Alkaline Phosphatase 131 (H) 38 - 126 U/L   Total Bilirubin 1.0 0.3 - 1.2 mg/dL   GFR, Estimated 53 (L) >60 mL/min    Comment: (NOTE) Calculated using the CKD-EPI Creatinine Equation (2021)    Anion gap 12 5 - 15    Comment: Performed at [facility], [address] [place], [place] [address]  Magnesium     Status: Abnormal   Collection Time: 05/04/21  4:25 AM  Result Value Ref Range   Magnesium 1.5 (L) 1.7 - 2.4 mg/dL    Comment: Performed at [facility], [address], [place], [place] [address]  Phosphorus     Status: None   Collection Time: 05/04/21  4:25 AM  Result Value Ref Range   Phosphorus 4.0 2.5 - 4.6 mg/dL    Comment: Performed at [facility], [address] [place], [place] [address]    ECG   A. fib at 79,  prolonged QTC at 559 ms- Personally Reviewed  Telemetry   A. fib with CVR, frequent multifocal PVCs- Personally Reviewed  Radiology    DG Chest Port 1 View  Result Date: 05/02/2021 CLINICAL DATA:  Cough, COVID, weakness EXAM: PORTABLE CHEST 1 VIEW COMPARISON:  02/24/2021 FINDINGS: Status post median sternotomy and CABG. Unchanged mild cardiac enlargement. Hyperinflated lungs. Suspect a focal opacity in the right lung base, however evaluation is somewhat limited by overlying wires. No pleural effusion or pneumothorax. Redemonstrated calcified granuloma in the left lower lobe the visualized skeletal structures are unremarkable. IMPRESSION:  Suspect focal opacity in the right lung base, which could be infection, aspiration, or atelectasis; however evaluation is somewhat limited by multiple overlying wires. Electronically Signed   By: Wiliam Ke M.D.   On: 05/11/2021 19:50    Cardiac Studies   None  Assessment   Principal Problem:   Pneumonia due to COVID-19 virus Active Problems:   Chronic combined systolic and diastolic heart failure (HCC)   Frail elderly   Generalized weakness   Elevated digoxin level   Severe aortic stenosis   Plan   Very unfortunate 87 year old female with severe if not critical aortic stenosis by echo in April of this year.  According to her primary cardiologist she has not wanted any procedures regarding her valves and at this point I would not consider her a candidate for TAVR.  Given probable COVID-pneumonia and high levels of digoxin, but not clear evidence of digoxin toxicity, her mortality is high in the short-term.  Would recommend palliative care consultation for goals of care discussion.  Would continue to monitor digoxin levels to demonstrate decline, but would avoid Digibind at this point due to the potentially higher risks associated with that.  Time Spent Directly with Patient:  I have spent a total of 35 minutes with the patient reviewing hospital  notes, telemetry, EKGs, labs and examining the patient as well as establishing an assessment and plan that was discussed personally with the patient.  > 50% of time was spent in direct patient care.  Length of Stay:  LOS: 1 day   Chrystie Nose, MD, [facility], FACP  [place]  [facility]  Medical Director of the Advanced Lipid Disorders &  Cardiovascular Risk Reduction Clinic Diplomate of the American Board of Clinical Lipidology Attending Cardiologist  Direct Dial: [phone]  Fax: [phone]  Website:  [email or web address] Nicely Eragon Hammond 05/04/2021, 10:19 AM

## 2021-05-04 NOTE — ED Notes (Signed)
Cardiology at bedside.

## 2021-05-04 NOTE — Consult Note (Addendum)
                                                                                 Consultation Note Date: 05/04/2021   Patient Name: Jaclyn Berry  DOB: 05/15/1927  MRN: [id number]  Age / Sex: 87 y.o., female  PCP: Fargo, Amy E, NP Referring Physician: Swayze, Ava, DO  Reason for Consultation: Establishing goals of care  HPI/Patient Profile: 87 y.o. female  with past medical history of paroxysmal atrial fibrillation (not on anticoagulation), chronic combined systolic and diastolic CHF (EF 30-35% April 2022), moderate to severe tricuspid valve regurgitation, severe aortic stenosis, iron deficiency anemia, hypothyroidism, anxiety/depression, failure to thrive, esophageal dysmotility, GERD, and recent COVID-19 infection. She presented to [facility] emergency department on 04/19/2021 with worsening generalized weakness for the past 24-48 hours. She tested positive for COVID-19 on 04/27/21 and has completed a 5 day course of antiviral therapy. Family reports COVID symptoms have improved but generalized weakness and failure to thrive has progressed.  Work-up in the ED revealed right lower lobe infiltrates on chest x-ray, procalcitonin greater than 7, and digoxin level greater than 3. Admitted to [facility] with CAP, digoxin toxicity, failure to thrive, and generalized weakness.   Clinical Assessment and Goals of Care: I have reviewed medical records including EPIC notes, labs and imaging, and examined the patient. She remains confused and unable to participate in GOC discussion at this time.  I met at bedside with daughters Kim and Stephanie  to discuss diagnosis, prognosis, GOC, EOL wishes, disposition, and options.   I introduced Palliative Medicine as specialized medical care for people living with serious illness. It focuses on providing relief from the symptoms and stress of a serious illness.   We discussed a brief life review of the patient. She is originally from [place]. Her husband worked for an oil  company and they moved often related to his work. They ultimately retired in [place]. They had 2 daughters - Kim and Stephanie. Kim lives in [place] and Stephanie lives in [place]. Patient has been widowed since 2011. She lived alone for several years, but in February 2021 she suffered a fall at home resulting in hospital admission due to rhabdomyolysis. After she recovered, she went to live with her younger sister as it was determined she could no longer live alone. Even at this time, it seems she was showing a pattern of failure to thrive with poor oral intake.   Patient has lived with Stephanie since February 2022. Also in the home are Stephanie's husband and 2 sons (ages 15 and 21). Stephanie works as an RN at  in pediatrics. As far as functional status, up until recently patient has been ambulatory with a rollator.  Her nutritional status has remained poor with continued weight loss.   We discussed her current illness and what it means in the larger context of her ongoing co-morbidities.  Natural disease trajectory of chronic illness, advanced age, and frailty was discussed. Daughters clearly understand their mother's multiple co-morbid conditions and that she is not a candidate for advanced or invasive interventions.   I attempted to elicit values and goals of care important to the patient. Daughters express that   patient has always been very strong, and has previously indicated she did not want to "give up".    The difference between full scope medical intervention and comfort care was considered.  I introduced the concept of a comfort path to family, emphasizing that this path involves de-escalating and stopping full scope medical interventions, allowing a natural course to occur. Discussed that the goal is comfort and dignity rather than cure/prolonging life. Introduced hospice philosophy and provided information on home vs residential hospice services.   I gently stated that given  patient's overall current medical condition and pattern of decline, it seems she is likely approaching end of life. Daughters kindly indicate they are unsure they would agree with this assessment, and would like to hear from the physician regarding prognosis.   At this time family are clear that they would like to continue current medical interventions with watchful waiting. They wish to continue  and will make stepwise decisions based off of that information.   Advanced directives, concepts specific to code status, artifical feeding and hydration, and rehospitalization were considered and discussed. There is a POLST/MOST form on file in [place] that patient completed in [place] in December 2021. This form indicates: No CPR, Limited additional interventions, antibiotics if indicated, vasopressors if indicated, and time trial of artificial feeding (per discretion of daughter Maudie Mercury). This form was reviewed with daughters and they confirm this scope of treatment at this time.   Created space and opportunity for family to express thoughts and feelings regarding patient's current medical situation. Emotional support provided. Copy of hard choices book provided.     Primary decision maker:   HCPOA document on file in [place].  Health care agent is daughter Darryl Nestle. Alternate is daughter Inetta Fermo. It seems daughters prefer to make decisions together.   SUMMARY OF RECOMMENDATIONS   DNR/DNI as previously documented Continue current interventions  Patient seems to be approaching EOL PMT will continue to follow  Code Status/Advance Care Planning: DNR  Palliative Prophylaxis:  Oral Care and Turn Reposition  Additional Recommendations (Limitations, Scope, Preferences): No Surgical Procedures and No Tracheostomy  Prognosis:  Difficult to determine  Discharge Planning: To Be Determined      Primary Diagnoses: Present on Admission: **None**   I have reviewed the medical record,  interviewed the patient and family, and examined the patient. The following aspects are pertinent.  Past Medical History:  Diagnosis Date   A-fib ([place])    Anemia    Aortic valve stenosis    Arthritis    Depression    Gall stones    Gallstone    Low BP    Mitral valve stenosis     Family History  Problem Relation Age of Onset   COPD Mother    Emphysema Mother    Stroke Father        With complications   Colon cancer Neg Hx    Esophageal cancer Neg Hx    Colon polyps Neg Hx    Scheduled Meds:  amitriptyline  25 mg Oral QHS   vitamin C  500 mg Oral Daily   feeding supplement  237 mL Oral BID BM   heparin  5,000 Units Subcutaneous Q8H   levothyroxine  25 mcg Oral Daily   sildenafil  20 mg Oral BID   vitamin B-12  1,000 mcg Oral Daily   Continuous Infusions:  cefTRIAXone (ROCEPHIN)  IV     dextrose 5% lactated ringers 25 mL/hr at 05/04/21 0145   PRN  Meds:.traMADol   Allergies  Allergen Reactions   Tape Other (See Comments)    SKIN TEARS VERY EASILY!!!!   Morphine And Related Nausea And Vomiting   Sulfa Antibiotics Rash   Review of Systems  Unable to perform ROS: Other   Physical Exam Vitals reviewed.  Constitutional:      General: She is not in acute distress.    Appearance: She is cachectic. She is ill-appearing.  Cardiovascular:     Rate and Rhythm: Normal rate. Rhythm irregularly irregular.     Comments: A-fib with frequent PVCs Pulmonary:     Effort: Pulmonary effort is normal.  Skin:    Findings: Ecchymosis present.    Vital Signs: BP 90/63   Pulse 82   Temp 98.1 F (36.7 C) (Rectal)   Resp (!) 49   Ht 5' 3" (1.6 m)   Wt 32.7 kg   SpO2 90%   BMI 12.75 kg/m  Pain Scale: 0-10   Pain Score: 3    SpO2: SpO2: 90 % O2 Device:SpO2: 90 % O2 Flow Rate: .O2 Flow Rate (L/min): 2 L/min  IO: Intake/output summary:  Intake/Output Summary (Last 24 hours) at 05/04/2021 0943 Last data filed at 05/04/2021 1638 Gross per 24 hour  Intake --  Output  250 ml  Net -250 ml    LBM:   Baseline Weight: Weight: 32.7 kg Most recent weight: Weight: 32.7 kg      Palliative Assessment/Data: PPS 20%     Time In: 1600 Time Out: 1715 Time Total: 75 minutes Greater than 50%  of this time was spent counseling and coordinating care related to the above assessment and plan.  Signed by: Lavena Bullion, NP   Please contact Palliative Medicine Team phone at [phone] for questions and concerns.  For individual provider: See Shea Evans

## 2021-05-04 NOTE — Progress Notes (Signed)
The patient is a 87 yr old woman who presented to [facility] ED on 05/06/2021 with complaints of worsening generalized weakness for the past 24 to 48 hours. The patient has been unable to walk on her own due to generalized weakness. She also has had very poor oral intake. The patient tested positive for COVID-19 on 04/27/2021 and has completed 5 days of Remdesivir. COVID symptoms improved, but her FTT and weakness have increased. Work up in ED has demonstrated RLL pneumonia with procalcitonin greater than 7 with right lower lobe infiltrates. She is on digoxin chronically for atrial fibrillation, but her dig level upon presentation was very high at 3.3. She had AKI with creatinine 30% greater than baseline at 1.12. She has been started on IV ceftriaxone, and has received IV fluids. Magnesium level was low at 1.5 and has been supplemented. The patient is hypoxic and has been hypotensive, although this has responded to IV fluids to some degree. She has failed a bedside swallow eval, and SLP has been consulted. They have recommended a full liquid diet.   Upon my visit, the patient is lying on the gurney and appears moribund. She has taken her oxygen off and oxygen saturations are in the low 80's. I have replaced it. The patient is lethargic, but with eyes open and is not focusing on me. She is grunting with respirations. Systolic blood pressures are in the nineties. HR is in the 90's.  Lung sounds are significant for rales and rhonchi. No rales. Positive for accessory muscle use and tachypnea. The patient's abdomen is scaffoid, and her extremities are cachectic. She is moving all extremities, but cannot cooperate with exam.  I have consulted palliative care, and SLP. The patient is a DNR. Her progonosis is grim.

## 2021-05-04 NOTE — H&P (Signed)
History and Physical  Jaclyn Berry HEN:[id number] DOB: [birth year] DOA: 05/09/2021  Referring physician: Dr. Hyacinth Meeker, EDP PCP: [name], NP  Outpatient Specialists: Cardiology. Patient coming from: Home.  Chief Complaint: Generalized weakness  HPI: Jaclyn Berry is a 87 y.o. female with medical history significant for paroxysmal A. fib on digoxin, not on oral anticoagulation, chronic combined diastolic and systolic CHF EF 30 to 35%(12/05/2020), moderate to severe tricuspid valve regurgitation, severe aortic valve stenosis, iron deficiency anemia, hypothyroidism, chronic anxiety/depression, failure to thrive in an adult, esophageal dysmotility, GERD, recent COVID-19 viral infection treated with antivirals, who presented to [facility] ED from home due to worsening generalized weakness for the past 24 to 48 hours.  Associated with inability to ambulate due to weakness and poor oral intake.  Patient tested positive for COVID-19 and was started on molnupiravir on 04/27/2021 by PCP, completed 5 days, yesterday.  Per family COVID-19 viral symptoms have improved however her generalized weakness and failure to thrive is worsening.  She was brought in via EMS to the ED for further evaluation.  Work-up in the ED revealed superimposed bacterial RLL pneumonia with procalcitonin greater than 7, right lower lobe infiltrates on chest x-ray, elevated digoxin level greater than 3.  She was started on IV antibiotics empirically in the ED.  EDP contacted poison control, recommended IV fluid hydration and to repeat dig level in the next 6 hours.  [facility], hospitalist team, was asked to admit.  ED Course:  Temperature 97.1.  BP 111/67, pulse 85, respiration rate 20, saturation 98% on 2 L.  Lab studies remarkable for creatinine 1.12 with GFR 46.  Alkaline phosphatase 145, LDH 193.  Procalcitonin 7.40.  WBC 11.9, hemoglobin 8.4 from 9.3 on 04/20/2021.  Review of Systems: Review of systems as noted in the HPI. All other systems reviewed  and are negative.   Past Medical History:  Diagnosis Date   A-fib ([facility])    Anemia    Aortic valve stenosis    Arthritis    Depression    Gall stones    Gallstone    Low BP    Mitral valve stenosis    Past Surgical History:  Procedure Laterality Date   COLONOSCOPY     [place]. Normal   [facility]CP  09/[facility]   FRACTURE SURGERY  2016   Broken Arm   HIP SURGERY  2009   Replacement repaired     MELANOMA EXCISION  2013   right leg   mirtral valve repair  2008   ROTATOR CUFF REPAIR  2016   TOTAL HIP ARTHROPLASTY  2008   VAGINAL HYSTERECTOMY  2005    Social History:  reports that she has never smoked. She has never used smokeless tobacco. She reports that she does not drink alcohol and does not use drugs.   Allergies  Allergen Reactions   Tape Other (See Comments)    SKIN TEARS VERY EASILY!!!!   Morphine And Related Nausea And Vomiting   Sulfa Antibiotics Rash    Family History  Problem Relation Age of Onset   COPD Mother    Emphysema Mother    Stroke Father        With complications   Colon cancer Neg Hx    Esophageal cancer Neg Hx    Colon polyps Neg Hx       Prior to Admission medications   Medication Sig Start Date End Date Taking? Authorizing Provider  acetaminophen (TYLENOL) 650 MG CR tablet Take 650  mg by mouth every 12 (twelve) hours as needed for pain.   Yes [provider]  amitriptyline (ELAVIL) 50 MG tablet Take 25 mg by mouth at bedtime.   Yes [provider]  bumetanide (BUMEX) 1 MG tablet Take 1 mg by mouth daily.   Yes [provider]  digoxin (LANOXIN) 0.125 MG tablet Take 1 tablet (0.125 mg total) by mouth daily. 12/26/20  Yes Fargo, Amy E, NP  docusate sodium (COLACE) 100 MG capsule Take 100 mg by mouth daily.   Yes [provider]  ferrous sulfate 325 (65 FE) MG tablet Take 325 mg by mouth 2 (two) times daily with a meal.   Yes [provider]  lactose free nutrition  (BOOST) LIQD Take 237 mLs by mouth 2 (two) times daily between meals.   Yes [provider]  levothyroxine (SYNTHROID) 25 MCG tablet Take 1 tablet (25 mcg total) by mouth daily. 03/17/21  Yes Fargo, Amy E, NP  metoCLOPramide (REGLAN) 5 MG tablet TAKE 1 TABLET BY MOUTH TWICE A DAY Patient taking differently: Take 5 mg by mouth 2 (two) times daily. 01/23/21  Yes Fargo, Amy E, NP  mirtazapine (REMERON) 30 MG tablet Take 1 tablet (30 mg total) by mouth at bedtime. 12/26/20  Yes Fargo, Amy E, NP  molnupiravir EUA (LAGEVRIO) 200 MG CAPS capsule Take 4 capsules by mouth 2 (two) times daily. Start date:04/27/21   Yes [provider]  Multiple Vitamins-Minerals (PRESERVISION AREDS 2+MULTI VIT PO) Take 1 capsule by mouth at bedtime.   Yes [provider]  ondansetron (ZOFRAN) 4 MG tablet Take 4 mg by mouth 3 (three) times daily as needed for nausea or vomiting.   Yes [provider]  pantoprazole (PROTONIX) 40 MG tablet TAKE 1 TABLET BY MOUTH TWICE A DAY Patient taking differently: Take 40 mg by mouth 2 (two) times daily. 02/17/21  Yes Fargo, Amy E, NP  potassium chloride SA (KLOR-CON) 20 MEQ tablet Take 1 tablet (20 mEq total) by mouth daily. 12/16/20  Yes Fargo, Amy E, NP  sennosides-docusate sodium (SENOKOT-S) 8.6-50 MG tablet Take 1 tablet by mouth daily.   Yes [provider]  sildenafil (REVATIO) 20 MG tablet Take 1 tablet (20 mg total) by mouth 2 (two) times daily. 11/11/20  Yes Fargo, Amy E, NP  traMADol (ULTRAM) 50 MG tablet TAKE 1 TABLET (50 MG TOTAL) BY MOUTH 2 (TWO) TIMES DAILY FOR 5 DAYS. Patient taking differently: Take 50 mg by mouth every 12 (twelve) hours as needed for severe pain or moderate pain. 04/26/21  Yes Fargo, Amy E, NP  vitamin B-12 (CYANOCOBALAMIN) 1000 MCG tablet Take 1,000 mcg by mouth daily.   Yes [provider]  vitamin C (ASCORBIC ACID) 500 MG tablet Take 1 tablet (500 mg total) by mouth 2 (two) times daily. Patient taking  differently: Take 500 mg by mouth daily. 01/16/21  Yes Medina-Vargas, Monina C, NP    Physical Exam: BP (!) 108/59   Pulse 85   Temp (!) 97.1 F (36.2 C) (Axillary)   Resp 20   Ht 5\' 3"  (1.6 m)   Wt 32.7 kg   SpO2 94%   BMI 12.75 kg/m   General: 87 y.o. year-old female  Cachectic, lethargic but arouses to voices and answers appropriately. Cardiovascular: Irregular rate and rhythm with no rubs or gallops.  No thyromegaly or JVD noted.  No lower extremity edema. 2/4 pulses in all 4 extremities. Respiratory: Rales R lower lobe. Poor inspiratory effort. Abdomen:  Soft cachectic non distended with bowel sounds. Muskuloskeletal: No cyanosis, clubbing or edema noted bilaterally Neuro: CN II-XII intact, strength, sensation, reflexes Skin: No ulcerative lesions noted or rashes.  Hyperpigmentation in LE bilaterally.  Skin abrasions in the back. Psychiatry: Judgement and insight appear normal. Mood is appropriate for condition and setting          Labs on Admission:  Basic Metabolic Panel: Recent Labs  Lab 05/01/2021 1903  NA 139  K 4.8  CL 98  CO2 29  GLUCOSE 65*  BUN 64*  CREATININE 1.12*  CALCIUM 8.8*   Liver Function Tests: Recent Labs  Lab 05/05/2021 1903  AST 40  ALT 18  ALKPHOS 145*  BILITOT 1.0  PROT 6.7  ALBUMIN 2.0*   No results for input(s): LIPASE, AMYLASE in the last 168 hours. No results for input(s): AMMONIA in the last 168 hours. CBC: Recent Labs  Lab 04/17/2021 1903  WBC 11.9*  NEUTROABS 11.1*  HGB 8.4*  HCT 26.0*  MCV 98.1  PLT 258   Cardiac Enzymes: No results for input(s): CKTOTAL, CKMB, CKMBINDEX, TROPONINI in the last 168 hours.  BNP (last 3 results) Recent Labs    02/24/21 1407  BNP 526.5*    ProBNP (last 3 results) Recent Labs    11/04/20 1247  PROBNP 6,807*    CBG: No results for input(s): GLUCAP in the last 168 hours.  Radiological Exams on Admission: DG Chest Port 1 View  Result Date: 04/24/2021 CLINICAL DATA:  Cough,  COVID, weakness EXAM: PORTABLE CHEST 1 VIEW COMPARISON:  02/24/2021 FINDINGS: Status post median sternotomy and CABG. Unchanged mild cardiac enlargement. Hyperinflated lungs. Suspect a focal opacity in the right lung base, however evaluation is somewhat limited by overlying wires. No pleural effusion or pneumothorax. Redemonstrated calcified granuloma in the left lower lobe the visualized skeletal structures are unremarkable. IMPRESSION: Suspect focal opacity in the right lung base, which could be infection, aspiration, or atelectasis; however evaluation is somewhat limited by multiple overlying wires. Electronically Signed   By: Wiliam Ke M.D.   On: 04/14/2021 19:50    EKG: I independently viewed the EKG done and my findings are as followed: Sinus rhythm rate of 78, left anterior fascicular block, nonspecific ST-T changes.  QTC 512.  Assessment/Plan Present on Admission: **None**  Active Problems:   Generalized weakness  Generalized weakness in the setting of recent COVID-19 viral infection, esophageal dysmotility, and poor oral intake Recent COVID-19 viral infection, diagnosed on 9/14, was started on antivirals Molnupiravir on 04/27/2021, completed 5 days on 05/02/21. Poor oral intake in the setting of esophageal dysmotility Followed by GI outpatient. Obtain UA and urine culture Speech therapist consult Dietitian consult PT OT assessment in the morning Fall precautions  Digoxin toxicity Digoxin level on presentation 3.3 IV fluid hydration as recommended by poison control Repeat digoxin level in 6 hours. Closely monitor on telemetry Cardiology consulted  RLL CAP with recently treated covid 19 viral infection Started on Rocephin in the ED, increased dose of 2 g daily, continue Obtain MRSA screening test Procalcitonin >7, repeat level in the AM Sputum culture ordered, follow Monitor fever curve and WBC  Elevated D-dimer in the setting of recent COVID-19 viral infection D-dimer  6.43 Obtain bilateral lower extremity duplex ultrasound. Pharmacological DVT prophylaxis Hold off SCDs for now until DVT is ruled out  Dysphagia/esophageal dysmotility Follows with GI outpatient Speech therapist consulted for swallow evaluation N.p.o. until passes her swallow evaluation Aspiration precautions Judicious IV fluid hydration D5 LR at 25  cc/h x 1 day due to chronic systolic CHF, severe aortic stenosis  Failure to thrive in an adult/severe protein calorie malnutrition BMI 12 Severe muscle mass loss Dietary consult Encourage oral intake after she passes her swallow evaluation  Prolonged QTC Admission 12 EKG shows QTC 520 Avoid QTC prolonging agents Optimize magnesium and potassium levels Repeat 12 EKG in the morning Repeat BMP Obtain magnesium level  Chronic combined diastolic and systolic CHF, LVEF 30 to 35% Grade 2 diastolic dysfunction Resume home cardiac regimen Start strict I's and O's and daily weight  Paroxysmal A. fib, not on oral anticoagulation On home digoxin, hold off Digoxin level elevated greater than 3 Monitor rhythm and rate on telemetry Further recommendations per cardiology  Hypothyroidism Resume home levothyroxine  Chronic anxiety/depression Resume home amitriptyline and mirtazapine  Chronic GERD Resume home PPI when no longer NPO.  Goals of care Palliative care team consulted to assist with establishing goals of care Patient is currently DNR  Skin abrasions, pressure lesions Wound care specialist assessment Local wound care    DVT prophylaxis: Subcu heparin 3 times daily  Code Status: DNR  Family Communication: Daughter at bedside  Disposition Plan: Admit to telemetry cardiac unit  Consults called: None  Admission status: Inpatient status.  Patient will require at least 2 midnights for further evaluation and treatment of present condition.   Status is: Inpatient    Dispo:  Patient From:  Home  Planned  Disposition:  Home with Health Care Svc  Medically stable for discharge:  No          Darlin Drop MD [facility] Pager [phone]  If 7PM-7AM, please contact night-coverage [email or web address] Password [email or web address]  05/04/2021, 12:01 AM

## 2021-05-04 NOTE — ED Notes (Signed)
Poison control called for an update. Discussed recent Dig level 2.6, vitals. Patient is not experiencing any nausea, vomiting, or bradycardia. She is confused from her baseline but able to state her name, DOB, and where she is (unchanged during my shift).

## 2021-05-04 NOTE — Progress Notes (Signed)
Due to Crcl<30 ml/min (age and wt), ok to change lovenox to SQ heparin per Dr. Margo Aye.  Ulyses Southward, PharmD, BCIDP, AAHIVP, CPP Infectious Disease Pharmacist 05/04/2021 12:24 AM

## 2021-05-04 NOTE — Progress Notes (Signed)
Bilateral lower extremity venous duplex completed. Refer to "CV Proc" under chart review to view preliminary results.  05/04/2021 4:59 PM Eula Fried., MHA, RVT, RDCS, RDMS

## 2021-05-05 ENCOUNTER — Inpatient Hospital Stay (HOSPITAL_COMMUNITY): Payer: Medicare Other

## 2021-05-05 DIAGNOSIS — I350 Nonrheumatic aortic (valve) stenosis: Secondary | ICD-10-CM

## 2021-05-05 DIAGNOSIS — I5042 Chronic combined systolic (congestive) and diastolic (congestive) heart failure: Secondary | ICD-10-CM

## 2021-05-05 DIAGNOSIS — Z7189 Other specified counseling: Secondary | ICD-10-CM | POA: Diagnosis not present

## 2021-05-05 DIAGNOSIS — R531 Weakness: Secondary | ICD-10-CM

## 2021-05-05 DIAGNOSIS — U071 COVID-19: Secondary | ICD-10-CM | POA: Diagnosis not present

## 2021-05-05 DIAGNOSIS — R7889 Finding of other specified substances, not normally found in blood: Secondary | ICD-10-CM

## 2021-05-05 DIAGNOSIS — R627 Adult failure to thrive: Secondary | ICD-10-CM | POA: Diagnosis not present

## 2021-05-05 DIAGNOSIS — R638 Other symptoms and signs concerning food and fluid intake: Secondary | ICD-10-CM

## 2021-05-05 DIAGNOSIS — Z66 Do not resuscitate: Secondary | ICD-10-CM | POA: Diagnosis not present

## 2021-05-05 DIAGNOSIS — E43 Unspecified severe protein-calorie malnutrition: Secondary | ICD-10-CM

## 2021-05-05 DIAGNOSIS — E860 Dehydration: Secondary | ICD-10-CM | POA: Diagnosis not present

## 2021-05-05 DIAGNOSIS — Z789 Other specified health status: Secondary | ICD-10-CM

## 2021-05-05 DIAGNOSIS — T460X1A Poisoning by cardiac-stimulant glycosides and drugs of similar action, accidental (unintentional), initial encounter: Secondary | ICD-10-CM

## 2021-05-05 DIAGNOSIS — J1282 Pneumonia due to coronavirus disease 2019: Secondary | ICD-10-CM

## 2021-05-05 LAB — COMPREHENSIVE METABOLIC PANEL
ALT: 20 U/L (ref 0–44)
AST: 36 U/L (ref 15–41)
Albumin: 1.9 g/dL — ABNORMAL LOW (ref 3.5–5.0)
Alkaline Phosphatase: 136 U/L — ABNORMAL HIGH (ref 38–126)
Anion gap: 10 (ref 5–15)
BUN: 60 mg/dL — ABNORMAL HIGH (ref 8–23)
CO2: 28 mmol/L (ref 22–32)
Calcium: 8.8 mg/dL — ABNORMAL LOW (ref 8.9–10.3)
Chloride: 104 mmol/L (ref 98–111)
Creatinine, Ser: 1.08 mg/dL — ABNORMAL HIGH (ref 0.44–1.00)
GFR, Estimated: 48 mL/min — ABNORMAL LOW (ref >60)
Glucose, Bld: 102 mg/dL — ABNORMAL HIGH (ref 70–99)
Potassium: 4 mmol/L (ref 3.5–5.1)
Sodium: 142 mmol/L (ref 135–145)
Total Bilirubin: 1.1 mg/dL (ref 0.3–1.2)
Total Protein: 6.3 g/dL — ABNORMAL LOW (ref 6.5–8.1)

## 2021-05-05 LAB — CBC WITH DIFFERENTIAL/PLATELET
Abs Immature Granulocytes: 0.14 10*3/uL — ABNORMAL HIGH (ref 0.00–0.07)
Basophils Absolute: 0 10*3/uL (ref 0.0–0.1)
Basophils Relative: 0 %
Eosinophils Absolute: 0 10*3/uL (ref 0.0–0.5)
Eosinophils Relative: 0 %
HCT: 24.9 % — ABNORMAL LOW (ref 36.0–46.0)
Hemoglobin: 8 g/dL — ABNORMAL LOW (ref 12.0–15.0)
Immature Granulocytes: 1 %
Lymphocytes Relative: 3 %
Lymphs Abs: 0.3 10*3/uL — ABNORMAL LOW (ref 0.7–4.0)
MCH: 30.7 pg (ref 26.0–34.0)
MCHC: 32.1 g/dL (ref 30.0–36.0)
MCV: 95.4 fL (ref 80.0–100.0)
Monocytes Absolute: 0.2 10*3/uL (ref 0.1–1.0)
Monocytes Relative: 2 %
Neutro Abs: 10 10*3/uL — ABNORMAL HIGH (ref 1.7–7.7)
Neutrophils Relative %: 94 %
Platelets: 235 10*3/uL (ref 150–400)
RBC: 2.61 MIL/uL — ABNORMAL LOW (ref 3.87–5.11)
RDW: 14.4 % (ref 11.5–15.5)
WBC: 10.7 10*3/uL — ABNORMAL HIGH (ref 4.0–10.5)
nRBC: 0.2 % (ref 0.0–0.2)

## 2021-05-05 LAB — URINE CULTURE
Culture: NO GROWTH
Special Requests: NORMAL

## 2021-05-05 LAB — GLUCOSE, CAPILLARY
Glucose-Capillary: 110 mg/dL — ABNORMAL HIGH (ref 70–99)
Glucose-Capillary: 88 mg/dL (ref 70–99)
Glucose-Capillary: 85 mg/dL (ref 70–99)
Glucose-Capillary: 71 mg/dL (ref 70–99)

## 2021-05-05 LAB — DIGOXIN LEVEL: Digoxin Level: 2.2 ng/mL — ABNORMAL HIGH (ref 0.8–2.0)

## 2021-05-05 MED ORDER — HALOPERIDOL LACTATE 2 MG/ML PO CONC
2.0000 mg | Freq: Four times a day (QID) | ORAL | Status: DC | PRN
  Filled 2021-05-05: qty 1

## 2021-05-05 MED ORDER — ACETAMINOPHEN 325 MG PO TABS: 650.0000 mg | ORAL_TABLET | Freq: Four times a day (QID) | ORAL | Status: DC | PRN

## 2021-05-05 MED ORDER — HYDROMORPHONE HCL 1 MG/ML IJ SOLN
0.5000 mg | INTRAMUSCULAR | Status: DC | PRN
  Administered 2021-05-05: 0.5 mg via INTRAVENOUS
  Filled 2021-05-05: qty 1

## 2021-05-05 MED ORDER — LACTATED RINGERS IV SOLN: INTRAVENOUS | Status: DC

## 2021-05-05 MED ORDER — GLYCOPYRROLATE 0.2 MG/ML IJ SOLN: 0.2000 mg | INTRAMUSCULAR | Status: DC | PRN

## 2021-05-05 MED ORDER — ONDANSETRON HCL 4 MG/2ML IJ SOLN: 4.0000 mg | Freq: Four times a day (QID) | INTRAMUSCULAR | Status: DC | PRN

## 2021-05-05 MED ORDER — POLYVINYL ALCOHOL 1.4 % OP SOLN
1.0000 [drp] | Freq: Four times a day (QID) | OPHTHALMIC | Status: DC | PRN
  Administered 2021-05-07: 1 [drp] via OPHTHALMIC
  Filled 2021-05-05: qty 15

## 2021-05-05 MED ORDER — HALOPERIDOL LACTATE 5 MG/ML IJ SOLN: 2.0000 mg | Freq: Four times a day (QID) | INTRAMUSCULAR | Status: DC | PRN

## 2021-05-05 MED ORDER — LORAZEPAM 1 MG PO TABS: 1.0000 mg | ORAL_TABLET | ORAL | Status: DC | PRN

## 2021-05-05 MED ORDER — BIOTENE DRY MOUTH MT LIQD
15.0000 mL | OROMUCOSAL | Status: DC | PRN
  Administered 2021-05-06: 15 mL via TOPICAL

## 2021-05-05 MED ORDER — SODIUM CHLORIDE 0.9 % IV SOLN: 2.0000 g | INTRAVENOUS | Status: DC

## 2021-05-05 MED ORDER — GLYCOPYRROLATE 1 MG PO TABS
1.0000 mg | ORAL_TABLET | ORAL | Status: DC | PRN
  Filled 2021-05-05: qty 1

## 2021-05-05 MED ORDER — SODIUM CHLORIDE 0.9 % IV SOLN
1.0000 g | INTRAVENOUS | Status: DC
  Filled 2021-05-05: qty 1

## 2021-05-05 MED ORDER — MAGNESIUM SULFATE 2 GM/50ML IV SOLN: 2.0000 g | Freq: Once | INTRAVENOUS | Status: DC

## 2021-05-05 MED ORDER — HYDROMORPHONE HCL 1 MG/ML IJ SOLN
0.5000 mg | INTRAMUSCULAR | Status: DC | PRN
Start: 2021-05-05 — End: 2021-05-06
  Administered 2021-05-06: 0.5 mg via INTRAVENOUS
  Filled 2021-05-05: qty 1

## 2021-05-05 MED ORDER — VANCOMYCIN HCL 750 MG/150ML IV SOLN
750.0000 mg | Freq: Once | INTRAVENOUS | Status: DC
  Filled 2021-05-05: qty 150

## 2021-05-05 MED ORDER — DEXTROSE 10 % IV SOLN: INTRAVENOUS | Status: DC

## 2021-05-05 MED ORDER — ACETAMINOPHEN 650 MG RE SUPP: 650.0000 mg | Freq: Four times a day (QID) | RECTAL | Status: DC | PRN

## 2021-05-05 MED ORDER — IOHEXOL 350 MG/ML SOLN
57.0000 mL | Freq: Once | INTRAVENOUS | Status: AC | PRN
  Administered 2021-05-05: 57 mL via INTRAVENOUS

## 2021-05-05 MED ORDER — SODIUM CHLORIDE 0.9 % IV SOLN: 1.0000 g | INTRAVENOUS | Status: DC

## 2021-05-05 MED ORDER — LORAZEPAM 2 MG/ML PO CONC
1.0000 mg | ORAL | Status: DC | PRN
  Administered 2021-05-06: 1 mg via SUBLINGUAL
  Filled 2021-05-05: qty 0.5

## 2021-05-05 MED ORDER — LORAZEPAM 2 MG/ML IJ SOLN
1.0000 mg | INTRAMUSCULAR | Status: DC | PRN
  Administered 2021-05-06: 1 mg via INTRAVENOUS
  Filled 2021-05-05: qty 1

## 2021-05-05 MED ORDER — GLYCOPYRROLATE 0.2 MG/ML IJ SOLN
0.2000 mg | INTRAMUSCULAR | Status: DC | PRN
  Administered 2021-05-06: 0.2 mg via SUBCUTANEOUS
  Filled 2021-05-05: qty 1

## 2021-05-05 MED ORDER — ENSURE ENLIVE PO LIQD: 237.0000 mL | ORAL | Status: DC | PRN

## 2021-05-05 MED ORDER — ONDANSETRON 4 MG PO TBDP
4.0000 mg | ORAL_TABLET | Freq: Four times a day (QID) | ORAL | Status: DC | PRN
  Filled 2021-05-05: qty 1

## 2021-05-05 NOTE — Progress Notes (Signed)
PT Cancellation Note  Patient Details Name: Jaclyn Berry MRN: [id number] DOB: Nov 08, 1926   Cancelled Treatment:    Reason Eval/Treat Not Completed: Medical issues which prohibited therapy Per chart, patient not doing well medically and with very poor prognosis. Discussed with attending MD (Dr. Gerri Lins) and PT is signing off for now- medical team to reconsult if she improves enough to participate in PT. Thank you for the opportunity to participate in her care!  Madelaine Etienne, DPT, PN2   Supplemental Physical Therapist [facility]    Pager [phone] Acute Rehab Office [phone]

## 2021-05-05 NOTE — Progress Notes (Signed)
Speech Language Pathology Treatment: Dysphagia  Patient Details Name: Jaclyn Berry MRN: [id number] DOB: 07-29-1927 Today's Date: 05/05/2021 Time: 1350-1405 SLP Time Calculation (min) (ACUTE ONLY): 15 min  Assessment / Plan / Recommendation Clinical Impression  Pt with reports of poor PO toleration after clinical swallow assessment yesterday in ED, with notable severe coughing this am.  Upon discussion with pt's daughters, we had planned to proceed with MBS this afternoon to gain more information about Jaclyn Berry' swallowing.  Returned to prepare pt for exam; she is quite lethargic.  Had a difficult/uncomfortable morning and has had Dilaudid - now isn't sufficiently alert for participation.  Discussed with daughters Selena Batten and Judeth Cornfield. We reviewed the nature of Jaclyn Berry' swallowing difficulty,  the concerns for aspiration, the purpose of the MBS in helping [place] gather more information but that the results may not offer solutions.  They verbalize understanding.   Recommend continuing to allow sips and chips, liquids as tolerated.  Please continue vigilant oral care.   SLP will f/u next date. D/W RN.    HPI HPI: 87 y.o. female with medical history significant for paroxysmal A. fib on digoxin, chronic combined diastolic and systolic CHF EF 30 to 35%, moderate to severe tricuspid valve regurgitation, severe aortic valve stenosis, iron deficiency anemia, hypothyroidism, chronic anxiety/depression, failure to thrive in an adult, esophageal dysmotility, GERD, recent COVID-19 viral infection treated with antivirals, who presented to [facility] ED from home due to worsening generalized weakness for the past 24 to 48 hours. Dx bacterial RLL pna, failure to thrive in an adult/severe protein calorie malnutrition. 12/02/20 EGD showed esophageal motility disorder with extensive tertiary contractions. Her daughters, who are at bedside, report significant weight loss, early satiety. She drinks two boosts/day, which can take  quite a while for her to get down.      SLP Plan  Continue with current plan of care      Recommendations for follow up therapy are one component of a multi-disciplinary discharge planning process, led by the attending physician.  Recommendations may be updated based on patient status, additional functional criteria and insurance authorization.    Recommendations  Diet recommendations:  (allow sips and chips)                Oral Care Recommendations: Oral care QID Follow up Recommendations: None SLP Visit Diagnosis: Dysphagia, unspecified (R13.10) Plan: Continue with current plan of care                      Zikeria Keough L. [name], MA CCC/SLP Acute Rehabilitation Services Office number [phone] Pager [phone]  Carolan Shiver  05/05/2021, 2:17 PM

## 2021-05-05 NOTE — Progress Notes (Signed)
Daily Progress Note   Patient Name: Jaclyn Berry       Date: 05/05/2021 DOB: [birth year]  Age: 87 y.o. MRN#: [id number] Attending Physician: Fran Lowes, DO Primary Care Physician: [name], NP Admit Date: 05/06/2021  Reason for Consultation/Follow-up: Establishing goals of care  Subjective: Received notification from Tessie Fass that family would like to speak with PMT.   Chart review performed. Received report from primary RN - acute concerns of patient's overall decline, minimal PO intake.  Went to visit patient at bedside - daughter/Kim, daughter/Stephanie, Son in law/Scott, grandson/Hampton, and grandson/Dawson were present. Patient was lying in bed - she does not wake to voice/gentle touch. No signs or non-verbal gestures of pain or discomfort noted. No respiratory distress, increased work of breathing, or secretions noted.   Offered emotional support and therapeutic listening to family on how difficult the last several weeks have been - they reflect on the patient's sudden decline. Family understand at this time that patient will likely continue to decline despite interventions and request additional information on what full comfort measures would look like. We talked about transition to comfort measures in house and what that would entail inclusive of medications to control pain, dyspnea, agitation, nausea, itching, and hiccups. We discussed stopping all unnecessary measures such as blood draws, needle sticks, oxygen, antibiotics, CBGs/insulin, cardiac monitoring, IVF, and frequent vital signs.   Family at bedside state that additional family are traveling from out of state to see patient. Discussed goals in context of comfort measures. After discussion, family would like to continue  dextrose and oxygen at this time and are agreeable to discontinue all other life prolonging measures and transition to full comfort care.   Symptom management medications were reviewed in detail per their request.   Discussed pending Pulmonary consult - family feel this is no longer necessary.   Education provided on comfort feeding at EOL.   Reviewed current [place] COVID 19 visitation policy.  Briefly reviewed home hospice vs residential hospice. At this time, family would like patient to remain in house and are open to discuss options tomorrow.   All questions and concerns addressed. Encouraged to call with questions and/or concerns. PMT card provided.   Length of Stay: 2  Current Medications: Scheduled Meds:   amitriptyline  25 mg Oral QHS  vitamin C  500 mg Oral Daily   feeding supplement  237 mL Oral BID BM   heparin  5,000 Units Subcutaneous Q8H   levothyroxine  25 mcg Oral Daily   sildenafil  20 mg Oral BID   vitamin B-12  1,000 mcg Oral Daily    Continuous Infusions:  ceFEPime (MAXIPIME) IV     dextrose     lactated ringers     magnesium sulfate bolus IVPB     vancomycin      PRN Meds: HYDROmorphone (DILAUDID) injection, traMADol  Physical Exam Vitals and nursing note reviewed.  Constitutional:      General: She is not in acute distress.    Appearance: She is cachectic. She is ill-appearing.  Pulmonary:     Effort: No respiratory distress.  Skin:    General: Skin is warm and dry.  Neurological:     Mental Status: She is unresponsive.     Motor: Weakness present.  Psychiatric:        Speech: She is noncommunicative.            Vital Signs: BP 107/60 (BP Location: Right Arm)   Pulse 81   Temp 97.6 F (36.4 C) (Axillary)   Resp 14   Ht 5\' 3"  (1.6 m)   Wt 32.2 kg   SpO2 99%   BMI 12.57 kg/m  SpO2: SpO2: 99 % O2 Device: O2 Device: Nasal Cannula O2 Flow Rate: O2 Flow Rate (L/min): 2 L/min  Intake/output summary:  Intake/Output Summary  (Last 24 hours) at 05/05/2021 1657 Last data filed at 05/05/2021 05/05/2021 Gross per 24 hour  Intake 657.08 ml  Output --  Net 657.08 ml   LBM: Last BM Date:  (PTA) Baseline Weight: Weight: 32.7 kg Most recent weight: Weight: 32.2 kg       Palliative Assessment/Data: PPS 10%      Patient Active Problem List   Diagnosis Date Noted   Protein-calorie malnutrition, severe 05/05/2021   Elevated digoxin level 05/04/2021   Pneumonia due to COVID-19 virus 05/04/2021   Severe aortic stenosis 05/04/2021   Dehydration    Generalized weakness 05/01/2021   Symptomatic anemia 02/24/2021   Senile purpura (HCC) 12/09/2020   Frail elderly 12/09/2020   Unstable gait 10/25/2020   Paroxysmal atrial fibrillation (HCC) 10/25/2020   Nonrheumatic mitral valve stenosis 10/25/2020   Nonrheumatic aortic valve stenosis 10/25/2020   Fatigue 10/25/2020   Generalized abdominal pain 10/25/2020   Iron deficiency anemia secondary to inadequate dietary iron intake 10/25/2020   Hypotension 10/25/2020   Chronic combined systolic and diastolic heart failure (HCC) 10/25/2020   Chronic bilateral low back pain without sciatica 10/25/2020   Multiple falls 10/25/2020   Slow transit constipation 10/25/2020   Poor appetite 10/25/2020   Underweight due to inadequate caloric intake 10/25/2020    Palliative Care Assessment & Plan   Patient Profile: 87 y.o. female  with past medical history of paroxysmal atrial fibrillation (not on anticoagulation), chronic combined systolic and diastolic CHF (EF 99 April 86-57%), moderate to severe tricuspid valve regurgitation, severe aortic stenosis, iron deficiency anemia, hypothyroidism, anxiety/depression, failure to thrive, esophageal dysmotility, GERD, and recent COVID-19 infection. She presented to [facility] emergency department on 05/06/2021 with worsening generalized weakness for the past 24-48 hours. She tested positive for COVID-19 on 04/27/21 and has completed a 5 day course of  antiviral therapy. Family reports COVID symptoms have improved but generalized weakness and failure to thrive has progressed.  Work-up in the ED revealed right lower lobe  infiltrates on chest x-ray, procalcitonin greater than 7, and digoxin level greater than 3. Admitted to [facility] with CAP, digoxin toxicity, failure to thrive, and generalized weakness.   Assessment: Generalized weakness in setting of recent COVID 19 infection Esophageal dysmotility  Poor oral intake Digoxin toxicity Multifocal HCAP Elevated d-dimer Failure to thrive Prolonged QTc Chronic combined diastolic and systolic CHF Paroxysmal atrial fibrillation Terminal care  Recommendations/Plan Initiated full comfort measures - likely hours to days Continue DNR/DNI as previously documented At this time, family wish for patient to remain in house for EOL care  Family's hope is that patient will remain stable until additional family arrive from out of state tonight - continue dextrose and oxygen at this time Added orders for EOL symptom management and to reflect full comfort measures, as well as discontinued orders that were not focused on comfort Unrestricted visitation orders were placed per current [place] COVID19 EOL visitation policy  Nursing to provide frequent assessments and administer PRN medications as clinically necessary to ensure EOL comfort PMT will continue to follow and support holistically  Goals of Care and Additional Recommendations: Limitations on Scope of Treatment: Full Comfort Care  Code Status:    Code Status Orders  (From admission, onward)           Start     Ordered   05/23/21 2354  Do not attempt resuscitation (DNR)  Continuous       Question Answer Comment  In the event of cardiac or respiratory ARREST Do not call a "code blue"   In the event of cardiac or respiratory ARREST Do not perform Intubation, CPR, defibrillation or ACLS   In the event of cardiac or respiratory ARREST Use  medication by any route, position, wound care, and other measures to relive pain and suffering. May use oxygen, suction and manual treatment of airway obstruction as needed for comfort.      05-23-2021 2353           Code Status History     Date Active Date Inactive Code Status Order ID Comments User Context   05-23-21 2352 05-23-21 2353 DNR [id number]  Darlin Drop, DO ED   02/24/2021 1723 02/25/2021 1633 DNR [id number]  Eduard Clos, MD ED      Advance Directive Documentation    Flowsheet Row Most Recent Value  Type of Advance Directive Healthcare Power of Attorney  Pre-existing out of facility DNR order (yellow form or pink MOST form) --  "MOST" Form in Place? --       Prognosis:  < 2 weeks  Discharge Planning: To Be Determined  Care plan was discussed with primary RN, patient's family, Dr. Neale Burly  Thank you for allowing the Palliative Medicine Team to assist in the care of this patient.   Total Time 70 minutes Prolonged Time Billed  yes       Greater than 50%  of this time was spent counseling and coordinating care related to the above assessment and plan.  Haskel Khan, NP  Please contact Palliative Medicine Team phone at [phone] for questions and concerns.

## 2021-05-05 NOTE — Progress Notes (Signed)
Brief Palliative Medicine Progress Note:  After detailed GOC discussion with family, they have opted to transition patient to full comfort measures. Full note to follow:  Recommendations/Plan Initiated full comfort measures - likely hours to days Continue DNR/DNI as previously documented At this time, family wish for patient to remain in house for EOL care  Family's hope is that patient will remain stable until additional family arrive from out of state tonight - continue dextrose and oxygen at this time Added orders for EOL symptom management and to reflect full comfort measures, as well as discontinued orders that were not focused on comfort Unrestricted visitation orders were placed per current [place] COVID19 EOL visitation policy  Nursing to provide frequent assessments and administer PRN medications as clinically necessary to ensure EOL comfort PMT will continue to follow and support holistically   Thank you for allowing PMT to assist in the care of this patient.  Waller Marcussen M. Katrinka Blazing [facility] Palliative Medicine Team Team Phone: [phone] NO CHARGE

## 2021-05-05 NOTE — Progress Notes (Signed)
Initial Nutrition Assessment  DOCUMENTATION CODES:   Severe malnutrition in context of chronic illness, Underweight  INTERVENTION:   When diet advanced, add supplements: Boost Plus PO TID between meals, each supplement provides 360 kcal and 14 gm protein. Magic cup TID with meals, each supplement provides 290 kcal and 9 grams of protein.  NUTRITION DIAGNOSIS:   Severe Malnutrition related to chronic illness (CHF) as evidenced by severe muscle depletion, severe fat depletion.  GOAL:   Patient will meet greater than or equal to 90% of their needs  MONITOR:   PO intake, Supplement acceptance, Labs  REASON FOR ASSESSMENT:   Consult Assessment of nutrition requirement/status  ASSESSMENT:   87 yo female admitted with CAP, digoxin toxicity, FTT, generalized weakness. PMH includes CHF, esophageal dysmotility, recent COVID-19 infection, A fib, aortic and mitral valve stenosis, anemia, arthritis, gallstones, depression.  Spoke with patient's two daughters at bedside. Patient has progressively lost 35 lbs over the past year. She has had trouble with dysphagia and was started on Reglan in May. Her intake has gradually decreased over the past year. She drinks 2 Boost Plus supplements per day, but is not strong enough to drink through a straw. She takes medications in applesauce.   Patient was advance to full liquids by SLP 9/22, but had some difficulty with coughing after liquids this morning, so diet was changed to NPO and SLP to re-evaluate today. Plans for MBS this afternoon.    Labs reviewed.  CBG: 110-88-85  Medications reviewed and include vitamin C, Vitamin B-12.  Patient weighed 35 kg 6 months ago, currently 32.2 kg. 8% weight loss in 6 months. Patient meets criteria for severe malnutrition, given severe depletion of muscle and subcutaneous fat mass.  NUTRITION - FOCUSED PHYSICAL EXAM:  Flowsheet Row Most Recent Value  Orbital Region Severe depletion  Upper Arm Region  Severe depletion  Thoracic and Lumbar Region Severe depletion  Buccal Region Severe depletion  Temple Region Severe depletion  Clavicle Bone Region Severe depletion  Clavicle and Acromion Bone Region Severe depletion  Scapular Bone Region Severe depletion  Dorsal Hand Severe depletion  Patellar Region Severe depletion  Anterior Thigh Region Severe depletion  Posterior Calf Region Severe depletion  Edema (RD Assessment) None  Hair Reviewed  Eyes Unable to assess  Mouth Reviewed  Skin Reviewed  Nails Reviewed       Diet Order:   Diet Order             Diet NPO time specified  Diet effective now                   EDUCATION NEEDS:   No education needs have been identified at this time  Skin:  Skin Assessment: Reviewed RN Assessment  Last BM:  no BM documented  Height:   Ht Readings from Last 1 Encounters:  04/26/2021 5\' 3"  (1.6 m)    Weight:   Wt Readings from Last 1 Encounters:  05/05/21 32.2 kg    BMI:  Body mass index is 12.57 kg/m.  Estimated Nutritional Needs:   Kcal:  1200-1300  Protein:  60-70 gm  Fluid:  >/= 1.2 L    04/21/2021, RD, LDN, CNSC Please refer to Amion for contact information.

## 2021-05-05 NOTE — Consult Note (Signed)
NAME:  Jaclyn Berry, MRN:  [id number], DOB:  June 22, 1927, LOS: 2 ADMISSION DATE:  04/17/2021, CONSULTATION DATE:  05/05/21 REFERRING MD:  Gerri Lins - [facility] , CHIEF COMPLAINT:  end of life    History of Present Illness:  87 yo F DNR on admission, severe protein calorie malnutrition with BMT 12, failure to thrive, recent COVID-19 PNA, severe AS and severe TVR, afib on dig, combined systolic and diastolic HF, esophageal dysmotility who was admitted to [facility] 05/04/21 with worsening weakness and poor PO intake. Recent remdesivir and molnupiravir for COVID, completed 9/20.   Patient found to have elevated digoxin level, likely superimposed bacterial PNA. Admitted to [facility]. Cardiology consulted in setting of Afib and high digoxin levels. Unfortunately patient continued to decline over course of admission despite abx for PNA -- with worsening respiratory and mental status. Palliative care was consulted 9/22 -- no change in scope of care. A CT chest was acquired 9/23 which showed worsening bilateral infiltrates. PCCM consulted 9/23 for prognostic evaluation   Pertinent  Medical History  COVID FTT Severe protein calorie malnutrition Afib MVR AS GERD Esophageal dysmotility  Depression Combined systolic and diastolic HF   Significant Hospital Events: Including procedures, antibiotic start and stop dates in addition to other pertinent events   9/22 admitted to [facility]. Palliative consulted. Cards consulted.  9/23 poor prognosis again communicated by TRH. Family requesting additional opinion. CCM consulted in this setting   Interim History / Subjective:  Worsening encephalopathy and respiratory status   Objective   Blood pressure 107/60, pulse 81, temperature 97.6 F (36.4 C), temperature source Axillary, resp. rate 14, height 5\' 3"  (1.6 m), weight 32.2 kg, SpO2 99 %.        Intake/Output Summary (Last 24 hours) at 05/05/2021 1614 Last data filed at 05/05/2021 04/22/2021 Gross per 24 hour  Intake 657.08 ml   Output --  Net 657.08 ml   Filed Weights   04/27/2021 1804 05/05/21 0412  Weight: 32.7 kg 32.2 kg    Examination: General: Frail, debilitated, cachectic elderly F, appears to be at end of life HENT: NCAT severe temporal and mandibular muscle wasting. Anicteric sclera Lungs: Symmetrical chest expansion. Increased respiratory rate  Cardiovascular: afib. Cap refill < 3sec Abdomen: thin Extremities: Markedly decreased muscle mass, no acute joint deformity. Bony protuberances over large joints, clavicle, ribs.  Neuro: encephalopathic, does not follow commands GU: defer Skin: pale, scattered ecchymosis. Scattered skin ulceration   Resolved Hospital Problem list     Assessment & Plan:   Aspiration PNA in setting of esophageal dysmotility COVID 19 PNA Combined systolic and diastolic heart failure Afib Elevated digoxin level Severe AS Severe MVR  AKI Terminal encephalopathy  Severe protein calorie malnutrition FTT DNR status Goals of care / encounter for palliative care P -this patient is rapidly approaching the end of life. -PCCM communicated this poor prognosis to the patient's daughters at the bedside and advised that there are not additional aggressive interventions that would be helpful, and current scope of interventions are unlikely to change even short term prognosis.  -Recommended transition to comfort care. Discussed this as including recommendation for no further labs, no further imaging, de-escalation of interventions not aimed at comfort, and addition of medications for management of pain, anxiety and respiratory symptoms (namely addition of morphine, ativan-- discussed concerns for worsening respiratory status given pulm processes)  -Family would like to talk with palliative care again prior to making changes to care trajectory. I have called palliative care and asked if they might  be able to speak with family today -no indication for escalation of care, no transfer to  ICU     PCCM will sign off. Please let us know if we can be of further assistance    Best Practice (right click and "Reselect all SmartList Selections" daily)   Diet/type: NPO Code Status:  DNR Last date of multidisciplinary goals of care discussion [9/23 - PCCM with family ]  Labs   CBC: Recent Labs  Lab 05/02/2021 1903 05/04/21 0425 05/05/21 0146  WBC 11.9* 12.0* 10.7*  NEUTROABS 11.1*  --  10.0*  HGB 8.4* 8.3* 8.0*  HCT 26.0* 26.3* 24.9*  MCV 98.1 97.4 95.4  PLT 258 250 235    Basic Metabolic Panel: Recent Labs  Lab 04/27/2021 1903 05/04/21 0425 05/05/21 0146  NA 139 142 142  K 4.8 4.3 4.0  CL 98 102 104  CO2 29 28 28   GLUCOSE 65* 85 102*  BUN 64* 60* 60*  CREATININE 1.12* 0.99 1.08*  CALCIUM 8.8* 8.5* 8.8*  MG  --  1.5*  --   PHOS  --  4.0  --    GFR: Estimated Creatinine Clearance: 16.9 mL/min (A) (by C-G formula based on SCr of 1.08 mg/dL (H)). Recent Labs  Lab 04/17/2021 1903 05/04/21 0425 05/05/21 0146  PROCALCITON 7.40 7.00  --   WBC 11.9* 12.0* 10.7*  LATICACIDVEN 1.8  --   --     Liver Function Tests: Recent Labs  Lab 04/17/2021 1903 05/04/21 0425 05/05/21 0146  AST 40 38 36  ALT 18 18 20   ALKPHOS 145* 131* 136*  BILITOT 1.0 1.0 1.1  PROT 6.7 6.2* 6.3*  ALBUMIN 2.0* 2.0* 1.9*   No results for input(s): LIPASE, AMYLASE in the last 168 hours. No results for input(s): AMMONIA in the last 168 hours.  ABG No results found for: PHART, PCO2ART, PO2ART, HCO3, TCO2, ACIDBASEDEF, O2SAT   Coagulation Profile: No results for input(s): INR, PROTIME in the last 168 hours.  Cardiac Enzymes: No results for input(s): CKTOTAL, CKMB, CKMBINDEX, TROPONINI in the last 168 hours.  HbA1C: No results found for: HGBA1C  CBG: Recent Labs  Lab 05/04/21 2122 05/04/21 2343 05/05/21 0409 05/05/21 0750 05/05/21 1200  GLUCAP 97 106* 110* 88 85    Review of Systems:   Unable to obtain, encephalopathic   Past Medical History:  She,  has a past  medical history of A-fib (HCC), Anemia, Aortic valve stenosis, Arthritis, Depression, Gall stones, Gallstone, Low BP, and Mitral valve stenosis.   Surgical History:   Past Surgical History:  Procedure Laterality Date   COLONOSCOPY     04/20/2021. Normal   ERCP  09/2019   [facility]   FRACTURE SURGERY  2016   Broken Arm   HIP SURGERY  2009   Replacement repaired     MELANOMA EXCISION  2013   right leg   mirtral valve repair  2008   ROTATOR CUFF REPAIR  2016   TOTAL HIP ARTHROPLASTY  2008   VAGINAL HYSTERECTOMY  2005     Social History:   reports that she has never smoked. She has never used smokeless tobacco. She reports that she does not drink alcohol and does not use drugs.   Family History:  Her family history includes COPD in her mother; Emphysema in her mother; Stroke in her father. There is no history of Colon cancer, Esophageal cancer, or Colon polyps.   Allergies Allergies  Allergen Reactions   Tape Other (See Comments)  SKIN TEARS VERY EASILY!!!!   Morphine And Related Nausea And Vomiting   Sulfa Antibiotics Rash     Home Medications  Prior to Admission medications   Medication Sig Start Date End Date Taking? Authorizing Provider  acetaminophen (TYLENOL) 650 MG CR tablet Take 650 mg by mouth every 12 (twelve) hours as needed for pain.   Yes [provider]  amitriptyline (ELAVIL) 50 MG tablet Take 25 mg by mouth at bedtime.   Yes [provider]  bumetanide (BUMEX) 1 MG tablet Take 1 mg by mouth daily.   Yes [provider]  digoxin (LANOXIN) 0.125 MG tablet Take 1 tablet (0.125 mg total) by mouth daily. 12/26/20  Yes Fargo, Amy E, NP  docusate sodium (COLACE) 100 MG capsule Take 100 mg by mouth daily.   Yes [provider]  ferrous sulfate 325 (65 FE) MG tablet Take 325 mg by mouth 2 (two) times daily with a meal.   Yes [provider]  lactose free nutrition (BOOST) LIQD Take 237 mLs by mouth 2  (two) times daily between meals.   Yes [provider]  levothyroxine (SYNTHROID) 25 MCG tablet Take 1 tablet (25 mcg total) by mouth daily. 03/17/21  Yes Fargo, Amy E, NP  metoCLOPramide (REGLAN) 5 MG tablet TAKE 1 TABLET BY MOUTH TWICE A DAY Patient taking differently: Take 5 mg by mouth 2 (two) times daily. 01/23/21  Yes Fargo, Amy E, NP  mirtazapine (REMERON) 30 MG tablet Take 1 tablet (30 mg total) by mouth at bedtime. 12/26/20  Yes Fargo, Amy E, NP  molnupiravir EUA (LAGEVRIO) 200 MG CAPS capsule Take 4 capsules by mouth 2 (two) times daily. Start date:04/27/21   Yes [provider]  Multiple Vitamins-Minerals (PRESERVISION AREDS 2+MULTI VIT PO) Take 1 capsule by mouth at bedtime.   Yes [provider]  ondansetron (ZOFRAN) 4 MG tablet Take 4 mg by mouth 3 (three) times daily as needed for nausea or vomiting.   Yes [provider]  pantoprazole (PROTONIX) 40 MG tablet TAKE 1 TABLET BY MOUTH TWICE A DAY Patient taking differently: Take 40 mg by mouth 2 (two) times daily. 02/17/21  Yes Fargo, Amy E, NP  potassium chloride SA (KLOR-CON) 20 MEQ tablet Take 1 tablet (20 mEq total) by mouth daily. 12/16/20  Yes Fargo, Amy E, NP  sennosides-docusate sodium (SENOKOT-S) 8.6-50 MG tablet Take 1 tablet by mouth daily.   Yes [provider]  sildenafil (REVATIO) 20 MG tablet Take 1 tablet (20 mg total) by mouth 2 (two) times daily. 11/11/20  Yes Fargo, Amy E, NP  traMADol (ULTRAM) 50 MG tablet TAKE 1 TABLET (50 MG TOTAL) BY MOUTH 2 (TWO) TIMES DAILY FOR 5 DAYS. Patient taking differently: Take 50 mg by mouth every 12 (twelve) hours as needed for severe pain or moderate pain. 04/26/21  Yes Fargo, Amy E, NP  vitamin B-12 (CYANOCOBALAMIN) 1000 MCG tablet Take 1,000 mcg by mouth daily.   Yes [provider]  vitamin C (ASCORBIC ACID) 500 MG tablet Take 1 tablet (500 mg total) by mouth 2 (two) times daily. Patient taking differently: Take 500 mg by mouth daily. 01/16/21   Yes Medina-Vargas, Margit Banda, NP     Critical care time: n/a        Tessie Fass MSN, AGACNP-BC [facility] Pulmonary/Critical Care Medicine Amion for pager  05/05/2021, 4:35 PM

## 2021-05-05 NOTE — Progress Notes (Signed)
OT Cancellation Note  Patient Details Name: Dea Bitting MRN: [id number] DOB: Sep 11, 1926   Cancelled Treatment:    Reason Eval/Treat Not Completed: Medical issues which prohibited therapy.  Pt with poor prognosis and currently not appropriate for therapies.  Please reorder if pt status improves and she is able to participate.  Eber Jones., OTR/L Acute Rehabilitation Services Pager [phone] Office [phone]   Jeani Hawking M 05/05/2021, 11:22 AM

## 2021-05-05 NOTE — Progress Notes (Signed)
    Serum digoxin level remains pending. EKG ordered today to look for dig-related changes - this has not been performed. [facility] will follow-up tomorrow if these are available to review.  Chrystie Nose, MD, [facility], FACP  [place]  [facility]  Medical Director of the Advanced Lipid Disorders &  Cardiovascular Risk Reduction Clinic Diplomate of the American Board of Clinical Lipidology Attending Cardiologist  Direct Dial: [phone]  Fax: [phone]  Website:  [email or web address]

## 2021-05-05 NOTE — Progress Notes (Signed)
HOSPITAL MEDICINE OVERNIGHT EVENT NOTE    Nursing reports that despite patient being cleared to be placed on a full liquid diet yesterday afternoon, her attempts this morning to get patient to swallow even small amounts of clear liquids has resulted in bouts of severe coughing concerning for ongoing aspiration.  Nursing reports that she feels uncomfortable with proceeding with allowing the patient to take any oral intake.  Speech therapy evaluation from 9/22 reviewed.  Considering this nurses observations, will make patient n.p.o. for now.  I have asked nursing to notify speech therapy at the start of dayshift to come reassess the patient early this morning and determine if additional methods to evaluate swallow function are necessary.  Marinda Elk  MD [facility]

## 2021-05-05 NOTE — Progress Notes (Addendum)
Pharmacy Antibiotic Note  Jaclyn Berry is a 87 y.o. female with possibl;e pneumonia.  Pharmacy has been consulted for vancomycin dosing.   She was also on rocephin and has been changed to cefepime  -WBC= 10.7, afebrile -SCr= 1.08, wt 32kg -CT chest with possible pneumonia -urine cultures- negative -blood cultures- ngtd  Plan: -Vancomycin 750mg  IV x1 then check a random level in on 9/25 in the morning  -Continue Cefepime 1gm IV q24h -Will follow renal function, cultures and clinical progress    Height: 5\' 3"  (160 cm) Weight: 32.2 kg (70 lb 15.8 oz) IBW/kg (Calculated) : 52.4  Temp (24hrs), Avg:97.5 F (36.4 C), Min:97 F (36.1 C), Max:97.8 F (36.6 C)  Recent Labs  Lab 2021-05-28 1903 05/04/21 0425 05/05/21 0146  WBC 11.9* 12.0* 10.7*  CREATININE 1.12* 0.99 1.08*  LATICACIDVEN 1.8  --   --     Estimated Creatinine Clearance: 16.9 mL/min (A) (by C-G formula based on SCr of 1.08 mg/dL (H)).    Allergies  Allergen Reactions   Tape Other (See Comments)    SKIN TEARS VERY EASILY!!!!   Morphine And Related Nausea And Vomiting   Sulfa Antibiotics Rash     Thank you for allowing pharmacy to be a part of this patient's care.  05/06/21, PharmD Clinical Pharmacist **Pharmacist phone directory can now be found on [email or web address] ([email or web address]).  Listed under [facility].

## 2021-05-05 NOTE — Progress Notes (Signed)
SLP  Note  Patient Details Name: Jaclyn Berry MRN: [id number] DOB: [birth year]  Spoke with pt's daughters re: swallowing function. They agree with plan to proceed with MBS this afternoon.  Scheduled for 2:00 pm.  [name], MA CCC/SLP Acute Rehabilitation Services Office number [phone] Pager [phone]  [name] 05/05/2021, 11:19 AM

## 2021-05-05 NOTE — Progress Notes (Signed)
PROGRESS NOTE  Jaclyn Berry HGD:[id number] DOB: [birth year] DOA: 04/27/2021 PCP: [name], NP  Brief History   The patient is a 87 yr old woman who presented to [facility] ED on 04/28/2021 with complaints of worsening generalized weakness for the past 24 to 48 hours. The patient has been unable to walk on her own due to generalized weakness. She also has had very poor oral intake. The patient tested positive for COVID-19 on 04/27/2021 and has completed 5 days of Remdesivir. COVID symptoms improved, but her FTT and weakness have increased. Work up in ED has demonstrated RLL pneumonia with procalcitonin greater than 7 with right lower lobe infiltrates. She is on digoxin chronically for atrial fibrillation, but her dig level upon presentation was very high at 3.3. She had AKI with creatinine 30% greater than baseline at 1.12. She has been started on IV ceftriaxone, and has received IV fluids. Magnesium level was low at 1.5 and has been supplemented. The patient is hypoxic and has been hypotensive, although this has responded to IV fluids to some degree. She has failed a bedside swallow eval, and SLP has been consulted. They recommended a full liquid diet. However on the evening of 05/04/2021 when the patient's nurse attempted to feed her a full liquid diet, the patient coughed severely and feeding was stopped out of fear of aspiration.   SLP was reconsulted this morning.  Consultants  PCCM Cardiology  Procedures  None  Antibiotics   Anti-infectives (From admission, onward)    Start     Dose/Rate Route Frequency Ordered Stop   05/06/21 0800  cefTRIAXone (ROCEPHIN) 1 g in sodium chloride 0.9 % 100 mL IVPB  Status:  Discontinued        1 g 200 mL/hr over 30 Minutes Intravenous Every 24 hours 05/05/21 0758 05/05/21 1533   05/05/21 1800  ceFEPIme (MAXIPIME) 1 g in sodium chloride 0.9 % 100 mL IVPB  Status:  Discontinued        1 g 200 mL/hr over 30 Minutes Intravenous Every 24 hours 05/05/21 1533  05/05/21 1605   05/05/21 1700  ceFEPIme (MAXIPIME) 2 g in sodium chloride 0.9 % 100 mL IVPB        2 g 200 mL/hr over 30 Minutes Intravenous Every 24 hours 05/05/21 1605     05/05/21 1645  vancomycin (VANCOREADY) IVPB 750 mg/150 mL        750 mg 150 mL/hr over 60 Minutes Intravenous  Once 05/05/21 1557     05/04/21 0100  cefTRIAXone (ROCEPHIN) 2 g in sodium chloride 0.9 % 100 mL IVPB  Status:  Discontinued        2 g 200 mL/hr over 30 Minutes Intravenous Every 24 hours 05/04/21 0034 05/05/21 0758   05/05/2021 2030  cefTRIAXone (ROCEPHIN) 1 g in sodium chloride 0.9 % 100 mL IVPB        1 g 200 mL/hr over 30 Minutes Intravenous  Once 04/28/2021 2021 04/30/2021 2156      Subjective  The patient appears to be struggling less this morning. She is awake and interactive, although I am unable to understand her speech. Daughters are at bedside. She continues to require 2L O2 by [place].  Objective   Vitals:  Vitals:   05/05/21 0412 05/05/21 0753  BP: 123/68 107/60  Pulse: 77 81  Resp: 18 14  Temp: (!) 97 F (36.1 C) 97.6 F (36.4 C)  SpO2: 99% 99%    Exam:  Constitutional:  The patient is awake  and alert. She responds to voice. Her speech is unintelligible. She appears weak and confused.  Respiratory:  Rales at right base greater than left.  No wheezes or rhonchi are auscultated Diminished breath sounds bilaterally. Respiratory effort increased with speech - accessory muscle use Cardiovascular:  RRR, no m/r/g No LE extremity edema   Normal pedal pulses Abdomen:  Abdomen appears normal; no tenderness or masses Scaffoid No hernias No HSM Musculoskeletal:  Digits/nails BUE: no clubbing, cyanosis, petechiae, infection Extremities are cachectic. Skin:  No rashes, lesions, ulcers palpation of skin: no induration or nodules Neurologic:  CN 2-12 intact Sensation all 4 extremities intact The patient is moving all extremities. Psychiatric:  Unable to evaluate as patient is unable to  cooperate with exam.   I have personally reviewed the following:   Today's Data  Vitals  Lab Data  CBC, CMP  Imaging  CTA chest: Right lower lobe consolidate and Left lower lobe infiltrate.   Cardiology Data  EKG  Scheduled Meds:  amitriptyline  25 mg Oral QHS   vitamin C  500 mg Oral Daily   feeding supplement  237 mL Oral BID BM   heparin  5,000 Units Subcutaneous Q8H   levothyroxine  25 mcg Oral Daily   sildenafil  20 mg Oral BID   vitamin B-12  1,000 mcg Oral Daily   Continuous Infusions:  ceFEPime (MAXIPIME) IV     vancomycin      Principal Problem:   Pneumonia due to COVID-19 virus Active Problems:   Chronic combined systolic and diastolic heart failure (HCC)   Frail elderly   Generalized weakness   Elevated digoxin level   Severe aortic stenosis   Protein-calorie malnutrition, severe   LOS: 2 days   A & P  Generalized weakness in the setting of recent COVID-19 viral infection, esophageal dysmotility, and poor oral intake Recent COVID-19 viral infection, diagnosed on 9/14, was started on antivirals Molnupiravir on 04/27/2021, completed 5 days on 05/02/21. Poor oral intake in the setting of esophageal dysmotility Followed by GI outpatient. Obtain UA and urine culture Speech therapist consult Dietitian consult PT OT assessment in the morning Fall precautions   Digoxin toxicity Digoxin level on presentation 3.3 IV fluid hydration as recommended by poison control Repeat digoxin level in 6 hours. Closely monitor on telemetry Cardiology consulted   Multifocal HCAP with recently treated covid 19 viral infection Started on Rocephin in the ED, increased dose of 2 g daily. This has been changed to vancomycin and cefepime. MRSA screening test negative PCCM consulted as a second opinion at the request of daughters.   Elevated D-dimer in the setting of recent COVID-19 viral infection D-dimer 6.43 Obtain bilateral lower extremity duplex  ultrasound. Pharmacological DVT prophylaxis Left Peroneal DVT on Doppler, however it is age indeterminate. CTA of the chest was obtained and was negative for PE.   Dysphagia/esophageal dysmotility Follows with GI outpatient Speech therapist consulted for swallow evaluation N.p.o. until passes her repeat swallow evaluation after severe coughing episode with full liquid diet feed yesterday evening.  Aspiration precautions Judicious IV fluid hydration D5 LR at 25 cc/h x 1 day due to chronic systolic CHF, severe aortic stenosis   Failure to thrive in an adult/severe protein calorie malnutrition BMI 12 Severe muscle mass loss Dietary consult Hypoalbuminemia   Prolonged QTC Admission 12 EKG shows QTC 520 Avoid QTC prolonging agents Optimize magnesium and potassium levels Repeat 12 EKG in the morning Repeat BMP   Chronic combined diastolic and systolic CHF, LVEF  30 to 35% Grade 2 diastolic dysfunction Resume home cardiac regimen Start strict I's and O's and daily weight   Paroxysmal A. fib, not on oral anticoagulation On home digoxin, hold off Digoxin level elevated greater than 3 Monitor rhythm and rate on telemetry Further recommendations per cardiology   Hypothyroidism Resume home levothyroxine   Chronic anxiety/depression Resume home amitriptyline and mirtazapine   Chronic GERD Resume home PPI when no longer NPO.   Goals of care Palliative care team consulted to assist with establishing goals of care Patient is currently DNR Prognosis is grim.   Skin abrasions, pressure lesions Wound care specialist assessment Local wound care   I have seen and examined this patient myself. I have spent 36 minutes in her evaluation and care.   DVT prophylaxis: Subcu heparin 3 times daily   Code Status: DNR   Family Communication: Daughter at bedside   Disposition Plan: Admit to telemetry cardiac unit   Consults called: None   Admission status: Inpatient status.  Patient  will require at least 2 midnights for further evaluation and treatment of present condition.     Status is: Inpatient   Dispo:            Patient From: Home Home           Planned Disposition:  Home with Health Care Svc         Medically stable for discharge:  No   Cadence Haslam, DO [facility] Direct contact: see [email or web address]  7PM-7AM contact night coverage as above 05/05/2021, 4:49 PM  LOS: 2 days

## 2021-05-05 NOTE — Progress Notes (Signed)
Spoke with Verlon Au from [employer], Digoxin level from overnight has not resulted yet, will call her back with result.   Maryan Puls, [place] 09.23.22 [phone]

## 2021-05-06 DIAGNOSIS — I5042 Chronic combined systolic (congestive) and diastolic (congestive) heart failure: Secondary | ICD-10-CM | POA: Diagnosis not present

## 2021-05-06 DIAGNOSIS — E860 Dehydration: Secondary | ICD-10-CM | POA: Diagnosis not present

## 2021-05-06 DIAGNOSIS — R7889 Finding of other specified substances, not normally found in blood: Secondary | ICD-10-CM | POA: Diagnosis not present

## 2021-05-06 DIAGNOSIS — J1282 Pneumonia due to coronavirus disease 2019: Secondary | ICD-10-CM | POA: Diagnosis not present

## 2021-05-06 DIAGNOSIS — U071 COVID-19: Secondary | ICD-10-CM | POA: Diagnosis not present

## 2021-05-06 MED ORDER — HYDROMORPHONE HCL 1 MG/ML IJ SOLN
0.2500 mg | INTRAMUSCULAR | Status: DC | PRN
  Administered 2021-05-06: 0.5 mg via INTRAVENOUS
  Administered 2021-05-06: 0.25 mg via INTRAVENOUS
  Administered 2021-05-07 (×2): 0.5 mg via INTRAVENOUS
  Filled 2021-05-06 (×4): qty 1

## 2021-05-06 MED ORDER — LIDOCAINE 5 % EX PTCH: 2.0000 | MEDICATED_PATCH | CUTANEOUS | Status: DC

## 2021-05-06 NOTE — Progress Notes (Signed)
SLP Cancellation Note  Patient Details Name: Jaclyn Berry MRN: [id number] DOB: [birth year]   Family has decided to transition to a comfort approach to care. Our service will respectfully sign off.  [name] L. [name], MA CCC/SLP Acute Rehabilitation Services Office number [phone] Pager [phone]          [name] 05/06/2021, 8:16 AM

## 2021-05-06 NOTE — Progress Notes (Signed)
Patient ID: Jaclyn Berry, female   DOB: November 02, 1926, 87 y.o.   MRN: [id number] Reviewed Dr Blanchie Dessert note Telemetry with afib and rare PVC No digoxin toxic rhythm No junctional tachycardia, no regularization of afib And no bidirectional VT  Digoxin level continues to come down  3.3->2.6-> 2.2   Continue d/c Cr normal 1.08 but BUN 60 and K 4.0   Rhythm is stable off digoxin   [name] MD [facility]  Dr Anne Fu on call for cardiology today

## 2021-05-06 NOTE — Progress Notes (Signed)
Daily Progress Note   Patient Name: Jaclyn Berry       Date: 05/06/2021 DOB: 1927/08/01  Age: 87 y.o. MRN#: [id number] Attending Physician: Joseph Art, DO Primary Care Physician: [name], NP Admit Date: 04/16/2021  Reason for Consultation/Follow-up: Disposition, Establishing goals of care, Non pain symptom management, Pain control, Psychosocial/spiritual support, and Terminal Care  Subjective: Chart reviewed. Received report from primary RN - no acute concerns. Patient has had no oral intake - mouth is swabbed often for comfort by family.   Went to visit patient at bedside - multiple family present including daughters, son in law, grandsons, niece. Patient was lying in bed awake and talking with eyes closed. Emotional support provided. Family are understandably tearful during this time but are at peace with current situation. Therapeutic listening provided as daughter describes "beautiful" and "meaningful" conversations family has been able to have with patient during intermittent wakeful moments. During my visit, patient was saying her goodbyes to family and stated multiple times she's "ready to go." Family are supportive and attentive to patient. Created space and opportunity for patient and family to express thoughts and feelings regarding patient's current situation situation.   Discussed stopping dextrose and oxygen with patient's daughters as these are considered life prolonging measures - daughters are agreeable to stop these interventions today. They express gratitude for continuing these while out of state family were traveling - they arrived last night.   Recommendation given for transfer to residential hospice facility as it is felt at this time, patient is likely stable for  transfer. Family understand that it is anticipated patient will continue to decline and likely will not be stable in the near future for transfer. After discussing, family are not interested in putting the patient through a transfer and prefer she remain in house for EOL care - anticipate hospital death.   Chaplain services were offered - family are agreeable and hopeful for [place] to visit. If [place] unavailable, they would appreciate chaplain visit for emotional support and prayer.  Removed oxygen and IVF.  All questions and concerns addressed. Encouraged to call with questions and/or concerns. PMT card previously provided.  Verbally discussed with primary RN plan to remove all cardiac monitoring, continuous pulse ox, IVF, and oxygen.   Paged Chaplain - reviewed family's request for [facility] / chaplain  visit.   Length of Stay: 3  Current Medications: Scheduled Meds:    Continuous Infusions:  dextrose 50 mL/hr at 05/06/21 1114    PRN Meds: acetaminophen **OR** acetaminophen, antiseptic oral rinse, feeding supplement, glycopyrrolate **OR** glycopyrrolate **OR** glycopyrrolate, haloperidol **OR** haloperidol lactate, HYDROmorphone (DILAUDID) injection, LORazepam **OR** LORazepam **OR** LORazepam, ondansetron **OR** ondansetron (ZOFRAN) IV, polyvinyl alcohol  Physical Exam Vitals and nursing note reviewed.  Constitutional:      General: She is not in acute distress.    Appearance: She is cachectic. She is ill-appearing.  Pulmonary:     Effort: No respiratory distress.  Skin:    General: Skin is warm and dry.  Neurological:     Mental Status: She is alert. She is disoriented and confused.     Motor: Weakness present.  Psychiatric:        Speech: She is noncommunicative.            Vital Signs: BP 107/60 (BP Location: Right Arm)   Pulse 81   Temp 97.6 F (36.4 C) (Axillary)   Resp 14   Ht 5\' 3"  (1.6 m)   Wt 32.2 kg   SpO2 99%   BMI 12.57 kg/m  SpO2: SpO2: 99 % O2 Device:  O2 Device: Nasal Cannula O2 Flow Rate: O2 Flow Rate (L/min): 2 L/min  Intake/output summary: No intake or output data in the 24 hours ending 05/06/21 1157 LBM: Last BM Date:  (PTA) Baseline Weight: Weight: 32.7 kg Most recent weight: Weight: 32.2 kg       Palliative Assessment/Data: PPS 10%      Patient Active Problem List   Diagnosis Date Noted   Protein-calorie malnutrition, severe 05/05/2021   Elevated digoxin level 05/04/2021   Pneumonia due to COVID-19 virus 05/04/2021   Severe aortic stenosis 05/04/2021   Dehydration    Generalized weakness 04/25/2021   Symptomatic anemia 02/24/2021   Senile purpura (HCC) 12/09/2020   Frail elderly 12/09/2020   Unstable gait 10/25/2020   Paroxysmal atrial fibrillation (HCC) 10/25/2020   Nonrheumatic mitral valve stenosis 10/25/2020   Nonrheumatic aortic valve stenosis 10/25/2020   Fatigue 10/25/2020   Generalized abdominal pain 10/25/2020   Iron deficiency anemia secondary to inadequate dietary iron intake 10/25/2020   Hypotension 10/25/2020   Chronic combined systolic and diastolic heart failure (HCC) 10/25/2020   Chronic bilateral low back pain without sciatica 10/25/2020   Multiple falls 10/25/2020   Slow transit constipation 10/25/2020   Poor appetite 10/25/2020   Underweight due to inadequate caloric intake 10/25/2020    Palliative Care Assessment & Plan   Patient Profile: 87 y.o. female  with past medical history of paroxysmal atrial fibrillation (not on anticoagulation), chronic combined systolic and diastolic CHF (EF 99 April 25-05%), moderate to severe tricuspid valve regurgitation, severe aortic stenosis, iron deficiency anemia, hypothyroidism, anxiety/depression, failure to thrive, esophageal dysmotility, GERD, and recent COVID-19 infection. She presented to [facility] emergency department on 05/11/2021 with worsening generalized weakness for the past 24-48 hours. She tested positive for COVID-19 on 04/27/21 and has completed a  5 day course of antiviral therapy. Family reports COVID symptoms have improved but generalized weakness and failure to thrive has progressed.  Work-up in the ED revealed right lower lobe infiltrates on chest x-ray, procalcitonin greater than 7, and digoxin level greater than 3. Admitted to [facility] with CAP, digoxin toxicity, failure to thrive, and generalized weakness.   Assessment: Generalized weakness in setting of recent COVID 19 infection Esophageal dysmotility  Poor oral intake Digoxin toxicity Multifocal  HCAP Elevated d-dimer Failure to thrive Prolonged QTc Chronic combined diastolic and systolic CHF Paroxysmal atrial fibrillation Terminal care  Recommendations/Plan: Continue full comfort measures Continue DNR/DNI as previously documented Family do not want to put patient through a transfer and want her to remain in house for EOL - anticipate hospital death Oxygen and dextrose were stopped Chaplain notified and consulted for: family request for [facility] or chaplain visit Nursing to provide frequent assessments and administer PRN medications as clinically necessary to ensure EOL comfort PMT will continue to follow and support holistically   Goals of Care and Additional Recommendations: Limitations on Scope of Treatment: Full Comfort Care  Code Status:    Code Status Orders  (From admission, onward)           Start     Ordered   05/05/21 1828  Do not attempt resuscitation (DNR)  Continuous       Question Answer Comment  In the event of cardiac or respiratory ARREST Do not call a "code blue"   In the event of cardiac or respiratory ARREST Do not perform Intubation, CPR, defibrillation or ACLS   In the event of cardiac or respiratory ARREST Use medication by any route, position, wound care, and other measures to relive pain and suffering. May use oxygen, suction and manual treatment of airway obstruction as needed for comfort.      05/05/21 1838           Code Status  History     Date Active Date Inactive Code Status Order ID Comments User Context   05-18-2021 2353 05/05/2021 1838 DNR [id number]  Darlin Drop, DO ED   05/18/21 2352 05-18-21 2353 DNR [id number]  Darlin Drop, DO ED   02/24/2021 1723 02/25/2021 1633 DNR [id number]  Eduard Clos, MD ED      Advance Directive Documentation    Flowsheet Row Most Recent Value  Type of Advance Directive Healthcare Power of Attorney  Pre-existing out of facility DNR order (yellow form or pink MOST form) --  "MOST" Form in Place? --       Prognosis:  Hours - Days  Discharge Planning: Anticipated Hospital Death  Care plan was discussed with primary RN, patient's family, Dr. Benjamine Mola, chaplain  Thank you for allowing the Palliative Medicine Team to assist in the care of this patient.   Total Time 45 minutes Prolonged Time Billed  no       Greater than 50%  of this time was spent counseling and coordinating care related to the above assessment and plan.  Haskel Khan, NP  Please contact Palliative Medicine Team phone at [phone] for questions and concerns.

## 2021-05-06 NOTE — Progress Notes (Signed)
Sublingual lorazepam, from pharmacy. 0.5 mL/1mg  wated in stericycle in med room with Nehemiah Settle, Charity fundraiser.  Maralyn Sago Sharde Gover, RN 05/06/21 1730

## 2021-05-06 NOTE — Progress Notes (Signed)
Progress Note    [name]  FXT:[id number] DOB: [birth year]  DOA: May 22, 2021 PCP: [name], NP    Brief Narrative:     Medical records reviewed and are as summarized below:  Jaclyn Berry is an 87 y.o. female who presented to [facility] ED on 05/22/21 with complaints of worsening generalized weakness for the past 24 to 48 hours. The patient has been unable to walk on her own due to generalized weakness.The patient is hypoxic and has been hypotensive, although this has responded to IV fluids to some degree. She has failed a bedside swallow eval, and SLP has been consulted. They recommended a full liquid diet. However on the evening of 05/04/2021 when the patient's nurse attempted to feed her a full liquid diet, the patient coughed severely and feeding was stopped out of fear of aspiration.  Transitioned to comfort care.   Assessment/Plan:   Principal Problem:   Pneumonia due to COVID-19 virus Active Problems:   Chronic combined systolic and diastolic heart failure (HCC)   Frail elderly   Generalized weakness   Elevated digoxin level   Severe aortic stenosis   Protein-calorie malnutrition, severe   Transitioned to comfort care -suspect days at this point, patient is still able to make needs known -appreciated palliative care services  -patient appears comfortable   Family Communication/Anticipated D/C date and plan/Code Status   Comfort care        Medical Consultants:   Palliative care  Subjective:  C/o thirst   Objective:    Vitals:   05/04/21 2122 05/04/21 2343 05/05/21 0412 05/05/21 0753  BP: (!) 96/53 95/60 123/68 107/60  Pulse:   77 81  Resp: 18 18 18 14   Temp: (!) 97.5 F (36.4 C) 97.8 F (36.6 C) (!) 97 F (36.1 C) 97.6 F (36.4 C)  TempSrc: Axillary Axillary Axillary Axillary  SpO2: 95% 94% 99% 99%  Weight:   32.2 kg   Height:       No intake or output data in the 24 hours ending 05/06/21 1105 Filed Weights   05-22-2021 1804 05/05/21 0412   Weight: 32.7 kg 32.2 kg    Exam: In bed, able to make needs known  Data Reviewed:   I have personally reviewed following labs and imaging studies:  Labs: Labs show the following:   Basic Metabolic Panel: Recent Labs  Lab May 22, 2021 1903 05/04/21 0425 05/05/21 0146  NA 139 142 142  K 4.8 4.3 4.0  CL 98 102 104  CO2 29 28 28   GLUCOSE 65* 85 102*  BUN 64* 60* 60*  CREATININE 1.12* 0.99 1.08*  CALCIUM 8.8* 8.5* 8.8*  MG  --  1.5*  --   PHOS  --  4.0  --    GFR Estimated Creatinine Clearance: 16.9 mL/min (A) (by C-G formula based on SCr of 1.08 mg/dL (H)). Liver Function Tests: Recent Labs  Lab May 22, 2021 1903 05/04/21 0425 05/05/21 0146  AST 40 38 36  ALT 18 18 20   ALKPHOS 145* 131* 136*  BILITOT 1.0 1.0 1.1  PROT 6.7 6.2* 6.3*  ALBUMIN 2.0* 2.0* 1.9*   No results for input(s): LIPASE, AMYLASE in the last 168 hours. No results for input(s): AMMONIA in the last 168 hours. Coagulation profile No results for input(s): INR, PROTIME in the last 168 hours.  CBC: Recent Labs  Lab 2021-05-22 1903 05/04/21 0425 05/05/21 0146  WBC 11.9* 12.0* 10.7*  NEUTROABS 11.1*  --  10.0*  HGB 8.4* 8.3* 8.0*  HCT 26.0* 26.3* 24.9*  MCV 98.1 97.4 95.4  PLT 258 250 235   Cardiac Enzymes: No results for input(s): CKTOTAL, CKMB, CKMBINDEX, TROPONINI in the last 168 hours. BNP (last 3 results) Recent Labs    11/04/20 1247  PROBNP 6,807*   CBG: Recent Labs  Lab 05/04/21 2343 05/05/21 0409 05/05/21 0750 05/05/21 1200 05/05/21 1634  GLUCAP 106* 110* 88 85 71   D-Dimer: Recent Labs    05/29/2021 1903  DDIMER 6.43*   Hgb A1c: No results for input(s): HGBA1C in the last 72 hours. Lipid Profile: Recent Labs    May 29, 2021 1903  TRIG 131   Thyroid function studies: No results for input(s): TSH, T4TOTAL, T3FREE, THYROIDAB in the last 72 hours.  Invalid input(s): FREET3 Anemia work up: Recent Labs    05/29/2021 1903  FERRITIN 649*   Sepsis Labs: Recent Labs  Lab  29-May-2021 1903 05/04/21 0425 05/05/21 0146  PROCALCITON 7.40 7.00  --   WBC 11.9* 12.0* 10.7*  LATICACIDVEN 1.8  --   --     Microbiology Recent Results (from the past 240 hour(s))  Blood Culture (routine x 2)     Status: None (Preliminary result)   Collection Time: 2021-05-29  8:00 PM   Specimen: BLOOD  Result Value Ref Range Status   Specimen Description BLOOD BLOOD RIGHT FOREARM  Final   Special Requests   Final    BOTTLES DRAWN AEROBIC AND ANAEROBIC Blood Culture adequate volume   Culture   Final    NO GROWTH 3 DAYS Performed at [facility], [address] [place], [place] [address]    Report Status PENDING  Incomplete  Urine Culture     Status: None   Collection Time: 29-May-2021 10:15 PM   Specimen: Urine, Clean Catch  Result Value Ref Range Status   Specimen Description URINE, CLEAN CATCH  Final   Special Requests Normal  Final   Culture   Final    NO GROWTH Performed at [facility], [address], [place], [place] [address]    Report Status 05/05/2021 FINAL  Final  MRSA Next Gen by PCR, Nasal     Status: None   Collection Time: 05/04/21 12:34 AM   Specimen: Nasal Mucosa; Nasal Swab  Result Value Ref Range Status   MRSA by PCR Next Gen NOT DETECTED NOT DETECTED Final    Comment: (NOTE) The GeneXpert MRSA Assay (FDA approved for NASAL specimens only), is one component of a comprehensive MRSA colonization surveillance program. It is not intended to diagnose MRSA infection nor to guide or monitor treatment for MRSA infections. Test performance is not FDA approved in patients less than 26 years old. Performed at [facility], [address] [place], [place] [address]     Procedures and diagnostic studies:  CT Angio Chest Pulmonary Embolism (PE) W or WO Contrast  Result Date: 05/05/2021 CLINICAL DATA:  COVID positive, cough, pneumonia. Concern for pulmonary embolism EXAM: CT ANGIOGRAPHY CHEST WITH CONTRAST TECHNIQUE: Multidetector CT imaging of  the chest was performed using the standard protocol during bolus administration of intravenous contrast. Multiplanar CT image reconstructions and MIPs were obtained to evaluate the vascular anatomy. CONTRAST:  30mL OMNIPAQUE IOHEXOL 350 MG/ML SOLN COMPARISON:  Chest radiograph May 29, 2021 FINDINGS: Cardiovascular: No filling defects within the pulmonary arteries to suggest acute pulmonary embolism. Reflux of contrast into the hepatic veins suggest RIGHT heart failure. Coronary artery calcification and aortic atherosclerotic calcification. Cardiomegaly with enlarged LEFT ventricle. Mediastinum/Nodes: No  axillary or supraclavicular adenopathy. No mediastinal or hilar adenopathy. No pericardial fluid. Esophagus normal. Lungs/Pleura: Elongated band of consolidation extends from the RIGHT hilum superiorly along the oblique fissure this band parenchymal consolidation measures 7 cm x 2.5 cm on coronal image 69/10. At the level the hilum there is a small cavitary portion measuring 1.2 cm. There air bronchograms more peripherally in this band consolidation. In the RIGHT lower lobe there is dense consolidation without air bronchograms. There is filling defect within a RIGHT lower lobe bronchus (image 97/8). Mild consolidated pattern in the LEFT lower lobe to a lesser degree. Upper lungs are relatively clear. Upper Abdomen: Limited view of the liver, kidneys, pancreas are unremarkable. Normal adrenal glands. Musculoskeletal: No aggressive osseous lesion. Review of the MIP images confirms the above findings. IMPRESSION: 1. No evidence acute pulmonary embolism. 2. Dense consolidation in the RIGHT lower lobe with filling of a RIGHT lower lobe bronchus. Findings suggest bronchopneumonia within the RIGHT lower lobe. Filling defects in the RIGHT lobe bronchus favored mucous plugging but cannot exclude neoplasm or infection. 3. Band of consolidation along the RIGHT oblique fissure is most suggestive of pulmonary infection, again  cannot exclude neoplasm. Recommend follow-up imaging 4. Mild consolidation in the LEFT lower lobe also suggesting bronchopneumonia versus aspiration pneumonia. Electronically Signed   By: Genevive Bi M.D.   On: 05/05/2021 11:00   VAS [place] LOWER EXTREMITY VENOUS (DVT)  Result Date: 05/04/2021  Lower Venous DVT Study Patient Name:  TORIAN THOENNES  Date of Exam:   05/04/2021 Medical Rec #: [id number]       Accession #:    [id number] Date of Birth: 1926-11-17        Patient Gender: F Patient Age:   56 years Exam Location:  [facility] Procedure:      VAS [place] LOWER EXTREMITY VENOUS (DVT) Referring Phys: Enid Derry HALL --------------------------------------------------------------------------------  Indications: Edema.  Limitations: Body habitus. Comparison Study: No prior study Performing Technologist: Gertie Fey MHA, RDMS, RVT, RDCS  Examination Guidelines: A complete evaluation includes B-mode imaging, spectral Doppler, color Doppler, and power Doppler as needed of all accessible portions of each vessel. Bilateral testing is considered an integral part of a complete examination. Limited examinations for reoccurring indications may be performed as noted. The reflux portion of the exam is performed with the patient in reverse Trendelenburg.  +---------+---------------+---------+-----------+----------+--------------+ RIGHT    CompressibilityPhasicitySpontaneityPropertiesThrombus Aging +---------+---------------+---------+-----------+----------+--------------+ FV Prox  Full                               patent                   +---------+---------------+---------+-----------+----------+--------------+ FV Mid   Full                               patent                   +---------+---------------+---------+-----------+----------+--------------+ FV DistalFull                               patent                    +---------+---------------+---------+-----------+----------+--------------+ PFV      Full  patent                   +---------+---------------+---------+-----------+----------+--------------+ PTV      Full                               patent                   +---------+---------------+---------+-----------+----------+--------------+ PERO     Full                               patent                   +---------+---------------+---------+-----------+----------+--------------+   Right Technical Findings: Not visualized segments include CFV, SFJ, popliteal veins.  +---------+---------------+---------+-----------+----------+-----------------+ LEFT     CompressibilityPhasicitySpontaneityPropertiesThrombus Aging    +---------+---------------+---------+-----------+----------+-----------------+ FV Prox  Full                                                           +---------+---------------+---------+-----------+----------+-----------------+ FV Mid   Full                                                           +---------+---------------+---------+-----------+----------+-----------------+ FV DistalFull                                                           +---------+---------------+---------+-----------+----------+-----------------+ PFV      Full                                                           +---------+---------------+---------+-----------+----------+-----------------+ POP      Full                    Yes                                    +---------+---------------+---------+-----------+----------+-----------------+ PTV      Full                                                           +---------+---------------+---------+-----------+----------+-----------------+ PERO     Partial                                      Age Indeterminate  +---------+---------------+---------+-----------+----------+-----------------+   Left Technical Findings: Not visualized segments include CFV, SFJ.  Summary: RIGHT: - There is no evidence of deep vein thrombosis in the lower extremity. However, portions of this examination were limited- see technologist comments above.  LEFT: - Findings consistent with age indeterminate deep vein thrombosis involving the left peroneal veins.  *See table(s) above for measurements and observations. Electronically signed by Heath Lark on 05/04/2021 at 6:48:49 PM.    Final     Medications:    Continuous Infusions:  dextrose 50 mL/hr at 05/05/21 1718     LOS: 3 days   Joseph Art  [facility]   How to contact the [facility] Attending or Consulting provider 7A - 7P or covering provider during after hours 7P -7A, for this patient?  Check the care team in [facility] and look for a) attending/consulting [facility] provider listed and b) the [facility] team listed Log into [email or web address] and use [place]'s universal password to access. If you do not have the password, please contact the hospital operator. Locate the [facility] provider you are looking for under [facility] and page to a number that you can be directly reached. If you still have difficulty reaching the provider, please page the [facility] (Director on Call) for the Hospitalists listed on amion for assistance.  05/06/2021, 11:05 AM

## 2021-05-06 NOTE — Progress Notes (Addendum)
[facility] that were contacted did not respond to request.  Chaplain visited patient at bedside and attneded to family who had gathered.  15 family members were present for patient, Jaclyn Berry.  Patient was slightly alert and wished for prayer and anointing.  Chaplain introduced herself to each family member and had them all gather at bedside, holding patient in their love.  Each family members were given opportunity to share their love and devotion to the 87 year old patient.  Chaplain led family and patient in saying The Lords Prayer and anointed patient with oil.  Ended visit with a departing blessing with each family member and [place].    05/06/21 1300  Clinical Encounter Type  Visited With Health care provider  Visit Type Spiritual support  Referral From Nurse  Consult/Referral To Faith community  Spiritual Encounters  Spiritual Needs Sacred text;Ritual;Emotional;Prayer  Stress Factors  Patient Stress Factors None identified  Family Stress Factors Major life changes  Chaplain was called for referral from local community for a Priest to visit for End Of Life (EOL)  spiritual care.  Chaplain telephoned to [place] from [facility]. [employer] for visitation.  Will remain available for additional services as indicated.  Will check in with patient and family today.   Rev. Amaryllis Dyke

## 2021-05-07 DIAGNOSIS — R7889 Finding of other specified substances, not normally found in blood: Secondary | ICD-10-CM | POA: Diagnosis not present

## 2021-05-07 DIAGNOSIS — E860 Dehydration: Secondary | ICD-10-CM | POA: Diagnosis not present

## 2021-05-07 DIAGNOSIS — I5042 Chronic combined systolic (congestive) and diastolic (congestive) heart failure: Secondary | ICD-10-CM | POA: Diagnosis not present

## 2021-05-07 DIAGNOSIS — U071 COVID-19: Secondary | ICD-10-CM | POA: Diagnosis not present

## 2021-05-07 DIAGNOSIS — J1282 Pneumonia due to coronavirus disease 2019: Secondary | ICD-10-CM | POA: Diagnosis not present

## 2021-05-07 MED ORDER — SCOPOLAMINE 1 MG/3DAYS TD PT72
1.0000 | MEDICATED_PATCH | TRANSDERMAL | Status: DC
  Administered 2021-05-07: 1.5 mg via TRANSDERMAL
  Filled 2021-05-07: qty 1

## 2021-05-08 LAB — CULTURE, BLOOD (ROUTINE X 2)
Culture: NO GROWTH
Special Requests: ADEQUATE

## 2021-05-11 ENCOUNTER — Ambulatory Visit: Payer: Medicare Other

## 2021-05-13 DIAGNOSIS — 419620001 Death: Secondary | SNOMED CT | POA: Diagnosis not present

## 2021-05-13 NOTE — Progress Notes (Signed)
Daily Progress Note   Patient Name: Jaclyn Berry       Date: 05/08/2021 DOB: [birth year]  Age: 87 y.o. MRN#: [id number] Attending Physician: Joseph Art, DO Primary Care Physician: [name], NP Admit Date: June 01, 2021  Reason for Consultation/Follow-up: Non pain symptom management, Pain control, Psychosocial/spiritual support, and Terminal Care  Subjective: Chart review performed. Received report from primary RN - no acute concerns.  Went to visit patient at bedside - patient was nonresponsive and noted to have absence of respirations as well as pupils were fixed/non-reactive. Listened for apical pulse x 1 minute - unable to auscultate or palpate pulse. Skin warm to touch. Notified primary RN of findings - she came to bedside and verified assessment as above. TOD: 11:05am  Called patient's daughter/Kim to notify her that patient had peacefully passed away this morning. Emotional support and therapeutic listening was provided. Family will be coming to hospital to be with patient soon.  All questions and concerns addressed. Encouraged to call with questions and/or concerns. PMT card previously provided.   Length of Stay: 4  Current Medications: Scheduled Meds:   lidocaine  2 patch Transdermal Q24H   scopolamine  1 patch Transdermal Q72H    Continuous Infusions:   PRN Meds: acetaminophen **OR** acetaminophen, antiseptic oral rinse, feeding supplement, glycopyrrolate **OR** glycopyrrolate **OR** glycopyrrolate, haloperidol **OR** haloperidol lactate, HYDROmorphone (DILAUDID) injection, LORazepam **OR** LORazepam **OR** LORazepam, ondansetron **OR** ondansetron (ZOFRAN) IV, polyvinyl alcohol  Physical Exam Vitals and nursing note reviewed.  Pulmonary:     Comments: No  respirations Skin:    General: Skin is warm and dry.  Neurological:     Mental Status: She is unresponsive.            Vital Signs: BP 107/60 (BP Location: Right Arm)   Pulse 81   Temp 97.6 F (36.4 C) (Axillary)   Resp 14   Ht 5\' 3"  (1.6 m)   Wt 32.2 kg   SpO2 99%   BMI 12.57 kg/m  SpO2: SpO2: 99 % O2 Device: O2 Device: Nasal Cannula O2 Flow Rate: O2 Flow Rate (L/min): 2 L/min  Intake/output summary:  Intake/Output Summary (Last 24 hours) at 05/10/2021 1051 Last data filed at 05/06/2021 1700 Gross per 24 hour  Intake 1185 ml  Output --  Net 1185 ml   LBM: Last  BM Date:  (PTA) Baseline Weight: Weight: 32.7 kg Most recent weight: Weight: 32.2 kg       Palliative Assessment/Data: PPS 0%      Patient Active Problem List   Diagnosis Date Noted   Protein-calorie malnutrition, severe 05/05/2021   Elevated digoxin level 05/04/2021   Pneumonia due to COVID-19 virus 05/04/2021   Severe aortic stenosis 05/04/2021   Dehydration    Generalized weakness 05/10/21   Symptomatic anemia 02/24/2021   Senile purpura (HCC) 12/09/2020   Frail elderly 12/09/2020   Unstable gait 10/25/2020   Paroxysmal atrial fibrillation (HCC) 10/25/2020   Nonrheumatic mitral valve stenosis 10/25/2020   Nonrheumatic aortic valve stenosis 10/25/2020   Fatigue 10/25/2020   Generalized abdominal pain 10/25/2020   Iron deficiency anemia secondary to inadequate dietary iron intake 10/25/2020   Hypotension 10/25/2020   Chronic combined systolic and diastolic heart failure (HCC) 10/25/2020   Chronic bilateral low back pain without sciatica 10/25/2020   Multiple falls 10/25/2020   Slow transit constipation 10/25/2020   Poor appetite 10/25/2020   Underweight due to inadequate caloric intake 10/25/2020    Palliative Care Assessment & Plan   Patient Profile: 87 y.o. female  with past medical history of paroxysmal atrial fibrillation (not on anticoagulation), chronic combined systolic and  diastolic CHF (EF 01-02% April 7253), moderate to severe tricuspid valve regurgitation, severe aortic stenosis, iron deficiency anemia, hypothyroidism, anxiety/depression, failure to thrive, esophageal dysmotility, GERD, and recent COVID-19 infection. She presented to [facility] emergency department on 05/10/21 with worsening generalized weakness for the past 24-48 hours. She tested positive for COVID-19 on 04/27/21 and has completed a 5 day course of antiviral therapy. Family reports COVID symptoms have improved but generalized weakness and failure to thrive has progressed.  Work-up in the ED revealed right lower lobe infiltrates on chest x-ray, procalcitonin greater than 7, and digoxin level greater than 3. Admitted to [facility] with CAP, digoxin toxicity, failure to thrive, and generalized weakness.   Assessment: Generalized weakness in setting of recent COVID 19 infection Esophageal dysmotility  Poor oral intake Digoxin toxicity Multifocal HCAP Elevated d-dimer Failure to thrive Prolonged QTc Chronic combined diastolic and systolic CHF Paroxysmal atrial fibrillation Terminal care  Recommendations/Plan: Patient passed away peacefully this morning  Family have been notified and will be at patient's bedside soon  Goals of Care and Additional Recommendations: Limitations on Scope of Treatment: Full Comfort Care  Code Status:    Code Status Orders  (From admission, onward)           Start     Ordered   05/05/21 1828  Do not attempt resuscitation (DNR)  Continuous       Question Answer Comment  In the event of cardiac or respiratory ARREST Do not call a "code blue"   In the event of cardiac or respiratory ARREST Do not perform Intubation, CPR, defibrillation or ACLS   In the event of cardiac or respiratory ARREST Use medication by any route, position, wound care, and other measures to relive pain and suffering. May use oxygen, suction and manual treatment of airway obstruction as needed for  comfort.      05/05/21 1838           Code Status History     Date Active Date Inactive Code Status Order ID Comments User Context   05/10/2021 2353 05/05/2021 1838 DNR [id number]  Darlin Drop, DO ED   05-10-2021 2352 10-May-2021 2353 DNR [id number]  Darlin Drop, DO ED   02/24/2021  1723 02/25/2021 1633 DNR [id number]  Eduard Clos, MD ED      Advance Directive Documentation    Flowsheet Row Most Recent Value  Type of Advance Directive Healthcare Power of Attorney  Pre-existing out of facility DNR order (yellow form or pink MOST form) --  "MOST" Form in Place? --       Prognosis:  Deceased   Discharge Planning: Funeral home  Care plan was discussed with primary RN, patient's daughter  Thank you for allowing the Palliative Medicine Team to assist in the care of this patient.   Total Time 35 minutes Prolonged Time Billed  no       Greater than 50%  of this time was spent counseling and coordinating care related to the above assessment and plan.  Haskel Khan, NP  Please contact Palliative Medicine Team phone at [phone] for questions and concerns.

## 2021-05-13 NOTE — Death Summary Note (Signed)
DEATH SUMMARY   Patient Details  Name: Jaclyn Berry MRN: [id number] DOB: July 12, 1927  Admission/Discharge Information   Admit Date:  June 01, 2021  Date of Death: Date of Death: Jun 05, 2021  Time of Death: Time of Death: Dec 26, 1103  Length of Stay: 4  Referring Physician: [name], NP    Diagnoses  Preliminary cause of death:  Secondary Diagnoses (including complications and co-morbidities):  Principal Problem:   Pneumonia due to COVID-19 virus Active Problems:   Chronic combined systolic and diastolic heart failure (HCC)   Frail elderly   Generalized weakness   Elevated digoxin level   Severe aortic stenosis   Protein-calorie malnutrition, severe   Brief Hospital Course (including significant findings, care, treatment, and services provided and events leading to death)  Jaclyn Berry is an 87 y.o. female who presented to [facility] ED on 2021-06-01 with complaints of worsening generalized weakness for the past 24 to 48 hours. The patient has been unable to walk on her own due to generalized weakness.The patient is hypoxic and has been hypotensive, although this has responded to IV fluids to some degree. She has failed a bedside swallow eval, and SLP has been consulted. They recommended a full liquid diet. However on the evening of 05/04/2021 when the patient's nurse attempted to feed her a full liquid diet, the patient coughed severely and feeding was stopped out of fear of aspiration.  Transitioned to comfort care.     Pertinent Labs and Studies  Significant Diagnostic Studies CT Angio Chest Pulmonary Embolism (PE) W or WO Contrast  Result Date: 05/05/2021 CLINICAL DATA:  COVID positive, cough, pneumonia. Concern for pulmonary embolism EXAM: CT ANGIOGRAPHY CHEST WITH CONTRAST TECHNIQUE: Multidetector CT imaging of the chest was performed using the standard protocol during bolus administration of intravenous contrast. Multiplanar CT image reconstructions and MIPs were obtained to evaluate the  vascular anatomy. CONTRAST:  2mL OMNIPAQUE IOHEXOL 350 MG/ML SOLN COMPARISON:  Chest radiograph 06-01-21 FINDINGS: Cardiovascular: No filling defects within the pulmonary arteries to suggest acute pulmonary embolism. Reflux of contrast into the hepatic veins suggest RIGHT heart failure. Coronary artery calcification and aortic atherosclerotic calcification. Cardiomegaly with enlarged LEFT ventricle. Mediastinum/Nodes: No axillary or supraclavicular adenopathy. No mediastinal or hilar adenopathy. No pericardial fluid. Esophagus normal. Lungs/Pleura: Elongated band of consolidation extends from the RIGHT hilum superiorly along the oblique fissure this band parenchymal consolidation measures 7 cm x 2.5 cm on coronal image 69/10. At the level the hilum there is a small cavitary portion measuring 1.2 cm. There air bronchograms more peripherally in this band consolidation. In the RIGHT lower lobe there is dense consolidation without air bronchograms. There is filling defect within a RIGHT lower lobe bronchus (image 97/8). Mild consolidated pattern in the LEFT lower lobe to a lesser degree. Upper lungs are relatively clear. Upper Abdomen: Limited view of the liver, kidneys, pancreas are unremarkable. Normal adrenal glands. Musculoskeletal: No aggressive osseous lesion. Review of the MIP images confirms the above findings. IMPRESSION: 1. No evidence acute pulmonary embolism. 2. Dense consolidation in the RIGHT lower lobe with filling of a RIGHT lower lobe bronchus. Findings suggest bronchopneumonia within the RIGHT lower lobe. Filling defects in the RIGHT lobe bronchus favored mucous plugging but cannot exclude neoplasm or infection. 3. Band of consolidation along the RIGHT oblique fissure is most suggestive of pulmonary infection, again cannot exclude neoplasm. Recommend follow-up imaging 4. Mild consolidation in the LEFT lower lobe also suggesting bronchopneumonia versus aspiration pneumonia. Electronically Signed    By: Loura Halt.D.  On: 05/05/2021 11:00   DG Chest Port 1 View  Result Date: May 14, 2021 CLINICAL DATA:  Cough, COVID, weakness EXAM: PORTABLE CHEST 1 VIEW COMPARISON:  02/24/2021 FINDINGS: Status post median sternotomy and CABG. Unchanged mild cardiac enlargement. Hyperinflated lungs. Suspect a focal opacity in the right lung base, however evaluation is somewhat limited by overlying wires. No pleural effusion or pneumothorax. Redemonstrated calcified granuloma in the left lower lobe the visualized skeletal structures are unremarkable. IMPRESSION: Suspect focal opacity in the right lung base, which could be infection, aspiration, or atelectasis; however evaluation is somewhat limited by multiple overlying wires. Electronically Signed   By: Wiliam Ke M.D.   On: 2021/05/14 19:50   VAS [place] LOWER EXTREMITY VENOUS (DVT)  Result Date: 05/04/2021  Lower Venous DVT Study Patient Name:  Jaclyn Berry  Date of Exam:   05/04/2021 Medical Rec #: [id number]       Accession #:    [id number] Date of Birth: Dec 21, 1926        Patient Gender: F Patient Age:   29 years Exam Location:  [facility] Procedure:      VAS [place] LOWER EXTREMITY VENOUS (DVT) Referring Phys: Enid Derry HALL --------------------------------------------------------------------------------  Indications: Edema.  Limitations: Body habitus. Comparison Study: No prior study Performing Technologist: Gertie Fey MHA, RDMS, RVT, RDCS  Examination Guidelines: A complete evaluation includes B-mode imaging, spectral Doppler, color Doppler, and power Doppler as needed of all accessible portions of each vessel. Bilateral testing is considered an integral part of a complete examination. Limited examinations for reoccurring indications may be performed as noted. The reflux portion of the exam is performed with the patient in reverse Trendelenburg.  +---------+---------------+---------+-----------+----------+--------------+ RIGHT     CompressibilityPhasicitySpontaneityPropertiesThrombus Aging +---------+---------------+---------+-----------+----------+--------------+ FV Prox  Full                               patent                   +---------+---------------+---------+-----------+----------+--------------+ FV Mid   Full                               patent                   +---------+---------------+---------+-----------+----------+--------------+ FV DistalFull                               patent                   +---------+---------------+---------+-----------+----------+--------------+ PFV      Full                               patent                   +---------+---------------+---------+-----------+----------+--------------+ PTV      Full                               patent                   +---------+---------------+---------+-----------+----------+--------------+ PERO     Full  patent                   +---------+---------------+---------+-----------+----------+--------------+   Right Technical Findings: Not visualized segments include CFV, SFJ, popliteal veins.  +---------+---------------+---------+-----------+----------+-----------------+ LEFT     CompressibilityPhasicitySpontaneityPropertiesThrombus Aging    +---------+---------------+---------+-----------+----------+-----------------+ FV Prox  Full                                                           +---------+---------------+---------+-----------+----------+-----------------+ FV Mid   Full                                                           +---------+---------------+---------+-----------+----------+-----------------+ FV DistalFull                                                           +---------+---------------+---------+-----------+----------+-----------------+ PFV      Full                                                            +---------+---------------+---------+-----------+----------+-----------------+ POP      Full                    Yes                                    +---------+---------------+---------+-----------+----------+-----------------+ PTV      Full                                                           +---------+---------------+---------+-----------+----------+-----------------+ PERO     Partial                                      Age Indeterminate +---------+---------------+---------+-----------+----------+-----------------+   Left Technical Findings: Not visualized segments include CFV, SFJ.   Summary: RIGHT: - There is no evidence of deep vein thrombosis in the lower extremity. However, portions of this examination were limited- see technologist comments above.  LEFT: - Findings consistent with age indeterminate deep vein thrombosis involving the left peroneal veins.  *See table(s) above for measurements and observations. Electronically signed by Heath Lark on 05/04/2021 at 6:48:49 PM.    Final     Microbiology Recent Results (from the past 240 hour(s))  Blood Culture (routine x 2)     Status: None (Preliminary result)   Collection Time: 05/09/21  8:00 PM   Specimen: BLOOD  Result Value Ref Range Status   Specimen  Description BLOOD BLOOD RIGHT FOREARM  Final   Special Requests   Final    BOTTLES DRAWN AEROBIC AND ANAEROBIC Blood Culture adequate volume   Culture   Final    NO GROWTH 3 DAYS Performed at [facility], [address], [place], [place] [address]    Report Status PENDING  Incomplete  Urine Culture     Status: None   Collection Time: 04/30/2021 10:15 PM   Specimen: Urine, Clean Catch  Result Value Ref Range Status   Specimen Description URINE, CLEAN CATCH  Final   Special Requests Normal  Final   Culture   Final    NO GROWTH Performed at [facility], [address], [place], [place] [address]    Report Status 05/05/2021 FINAL  Final  MRSA  Next Gen by PCR, Nasal     Status: None   Collection Time: 05/04/21 12:34 AM   Specimen: Nasal Mucosa; Nasal Swab  Result Value Ref Range Status   MRSA by PCR Next Gen NOT DETECTED NOT DETECTED Final    Comment: (NOTE) The GeneXpert MRSA Assay (FDA approved for NASAL specimens only), is one component of a comprehensive MRSA colonization surveillance program. It is not intended to diagnose MRSA infection nor to guide or monitor treatment for MRSA infections. Test performance is not FDA approved in patients less than 15 years old. Performed at [facility], [address] [place], [place] [address]     Lab Basic Metabolic Panel: Recent Labs  Lab 04/17/2021 1903 05/04/21 0425 05/05/21 0146  NA 139 142 142  K 4.8 4.3 4.0  CL 98 102 104  CO2 29 28 28   GLUCOSE 65* 85 102*  BUN 64* 60* 60*  CREATININE 1.12* 0.99 1.08*  CALCIUM 8.8* 8.5* 8.8*  MG  --  1.5*  --   PHOS  --  4.0  --    Liver Function Tests: Recent Labs  Lab 04/26/2021 1903 05/04/21 0425 05/05/21 0146  AST 40 38 36  ALT 18 18 20   ALKPHOS 145* 131* 136*  BILITOT 1.0 1.0 1.1  PROT 6.7 6.2* 6.3*  ALBUMIN 2.0* 2.0* 1.9*   No results for input(s): LIPASE, AMYLASE in the last 168 hours. No results for input(s): AMMONIA in the last 168 hours. CBC: Recent Labs  Lab 04/30/2021 1903 05/04/21 0425 05/05/21 0146  WBC 11.9* 12.0* 10.7*  NEUTROABS 11.1*  --  10.0*  HGB 8.4* 8.3* 8.0*  HCT 26.0* 26.3* 24.9*  MCV 98.1 97.4 95.4  PLT 258 250 235   Cardiac Enzymes: No results for input(s): CKTOTAL, CKMB, CKMBINDEX, TROPONINI in the last 168 hours. Sepsis Labs: Recent Labs  Lab 04/23/2021 1903 05/04/21 0425 05/05/21 0146  PROCALCITON 7.40 7.00  --   WBC 11.9* 12.0* 10.7*  LATICACIDVEN 1.8  --   --        05/06/21 05-31-2021, 11:29 AM

## 2021-05-13 NOTE — Progress Notes (Signed)
Progress Note    Natahsa Marian  GUY:[id number] DOB: June 13, 1927  DOA: 22-May-2021 PCP: [name], NP    Brief Narrative:     Medical records reviewed and are as summarized below:  Janiel Derhammer is an 87 y.o. female who presented to [facility] ED on 05-22-2021 with complaints of worsening generalized weakness for the past 24 to 48 hours. The patient has been unable to walk on her own due to generalized weakness.The patient is hypoxic and has been hypotensive, although this has responded to IV fluids to some degree. She has failed a bedside swallow eval, and SLP has been consulted. They recommended a full liquid diet. However on the evening of 05/04/2021 when the patient's nurse attempted to feed her a full liquid diet, the patient coughed severely and feeding was stopped out of fear of aspiration.  Transitioned to comfort care.   Assessment/Plan:   Principal Problem:   Pneumonia due to COVID-19 virus Active Problems:   Chronic combined systolic and diastolic heart failure (HCC)   Frail elderly   Generalized weakness   Elevated digoxin level   Severe aortic stenosis   Protein-calorie malnutrition, severe   Transitioned to comfort care -less able to make needs known -- changed overnight per nursing -appreciated palliative care services  -patient appears comfortable   Family Communication/Anticipated D/C date and plan/Code Status   Comfort care        Medical Consultants:   Palliative care  Subjective:  Appears more restless-- getting pain meds per nursing now  Objective:    Vitals:   05/04/21 2122 05/04/21 2343 05/05/21 0412 05/05/21 0753  BP: (!) 96/53 95/60 123/68 107/60  Pulse:   77 81  Resp: 18 18 18 14   Temp: (!) 97.5 F (36.4 C) 97.8 F (36.6 C) (!) 97 F (36.1 C) 97.6 F (36.4 C)  TempSrc: Axillary Axillary Axillary Axillary  SpO2: 95% 94% 99% 99%  Weight:   32.2 kg   Height:        Intake/Output Summary (Last 24 hours) at 05/04/2021 0934 Last data  filed at 05/06/2021 1700 Gross per 24 hour  Intake 1185 ml  Output --  Net 1185 ml   Filed Weights   22-May-2021 1804 05/05/21 0412  Weight: 32.7 kg 32.2 kg    Exam: Restless this AM  Data Reviewed:   I have personally reviewed following labs and imaging studies:  Labs: Labs show the following:   Basic Metabolic Panel: Recent Labs  Lab 05-22-21 1903 05/04/21 0425 05/05/21 0146  NA 139 142 142  K 4.8 4.3 4.0  CL 98 102 104  CO2 29 28 28   GLUCOSE 65* 85 102*  BUN 64* 60* 60*  CREATININE 1.12* 0.99 1.08*  CALCIUM 8.8* 8.5* 8.8*  MG  --  1.5*  --   PHOS  --  4.0  --    GFR Estimated Creatinine Clearance: 16.9 mL/min (A) (by C-G formula based on SCr of 1.08 mg/dL (H)). Liver Function Tests: Recent Labs  Lab 2021-05-22 1903 05/04/21 0425 05/05/21 0146  AST 40 38 36  ALT 18 18 20   ALKPHOS 145* 131* 136*  BILITOT 1.0 1.0 1.1  PROT 6.7 6.2* 6.3*  ALBUMIN 2.0* 2.0* 1.9*   No results for input(s): LIPASE, AMYLASE in the last 168 hours. No results for input(s): AMMONIA in the last 168 hours. Coagulation profile No results for input(s): INR, PROTIME in the last 168 hours.  CBC: Recent Labs  Lab 22-May-2021 1903 05/04/21 0425  05/05/21 0146  WBC 11.9* 12.0* 10.7*  NEUTROABS 11.1*  --  10.0*  HGB 8.4* 8.3* 8.0*  HCT 26.0* 26.3* 24.9*  MCV 98.1 97.4 95.4  PLT 258 250 235   Cardiac Enzymes: No results for input(s): CKTOTAL, CKMB, CKMBINDEX, TROPONINI in the last 168 hours. BNP (last 3 results) Recent Labs    11/04/20 1247  PROBNP 6,807*   CBG: Recent Labs  Lab 05/04/21 2343 05/05/21 0409 05/05/21 0750 05/05/21 1200 05/05/21 1634  GLUCAP 106* 110* 88 85 71   D-Dimer: No results for input(s): DDIMER in the last 72 hours.  Hgb A1c: No results for input(s): HGBA1C in the last 72 hours. Lipid Profile: No results for input(s): CHOL, HDL, LDLCALC, TRIG, CHOLHDL, LDLDIRECT in the last 72 hours.  Thyroid function studies: No results for input(s): TSH,  T4TOTAL, T3FREE, THYROIDAB in the last 72 hours.  Invalid input(s): FREET3 Anemia work up: No results for input(s): VITAMINB12, FOLATE, FERRITIN, TIBC, IRON, RETICCTPCT in the last 72 hours.  Sepsis Labs: Recent Labs  Lab 04/21/2021 1903 05/04/21 0425 05/05/21 0146  PROCALCITON 7.40 7.00  --   WBC 11.9* 12.0* 10.7*  LATICACIDVEN 1.8  --   --     Microbiology Recent Results (from the past 240 hour(s))  Blood Culture (routine x 2)     Status: None (Preliminary result)   Collection Time: 04/25/2021  8:00 PM   Specimen: BLOOD  Result Value Ref Range Status   Specimen Description BLOOD BLOOD RIGHT FOREARM  Final   Special Requests   Final    BOTTLES DRAWN AEROBIC AND ANAEROBIC Blood Culture adequate volume   Culture   Final    NO GROWTH 3 DAYS Performed at [facility], [address], [place], [place] [address]    Report Status PENDING  Incomplete  Urine Culture     Status: None   Collection Time: 04/25/2021 10:15 PM   Specimen: Urine, Clean Catch  Result Value Ref Range Status   Specimen Description URINE, CLEAN CATCH  Final   Special Requests Normal  Final   Culture   Final    NO GROWTH Performed at [facility], [address], [place], [place] [address]    Report Status 05/05/2021 FINAL  Final  MRSA Next Gen by PCR, Nasal     Status: None   Collection Time: 05/04/21 12:34 AM   Specimen: Nasal Mucosa; Nasal Swab  Result Value Ref Range Status   MRSA by PCR Next Gen NOT DETECTED NOT DETECTED Final    Comment: (NOTE) The GeneXpert MRSA Assay (FDA approved for NASAL specimens only), is one component of a comprehensive MRSA colonization surveillance program. It is not intended to diagnose MRSA infection nor to guide or monitor treatment for MRSA infections. Test performance is not FDA approved in patients less than 64 years old. Performed at [facility], [address], [place], [place] [address]     Procedures and diagnostic studies:  CT Angio  Chest Pulmonary Embolism (PE) W or WO Contrast  Result Date: 05/05/2021 CLINICAL DATA:  COVID positive, cough, pneumonia. Concern for pulmonary embolism EXAM: CT ANGIOGRAPHY CHEST WITH CONTRAST TECHNIQUE: Multidetector CT imaging of the chest was performed using the standard protocol during bolus administration of intravenous contrast. Multiplanar CT image reconstructions and MIPs were obtained to evaluate the vascular anatomy. CONTRAST:  26mL OMNIPAQUE IOHEXOL 350 MG/ML SOLN COMPARISON:  Chest radiograph 04/14/2021 FINDINGS: Cardiovascular: No filling defects within the pulmonary arteries to suggest acute  pulmonary embolism. Reflux of contrast into the hepatic veins suggest RIGHT heart failure. Coronary artery calcification and aortic atherosclerotic calcification. Cardiomegaly with enlarged LEFT ventricle. Mediastinum/Nodes: No axillary or supraclavicular adenopathy. No mediastinal or hilar adenopathy. No pericardial fluid. Esophagus normal. Lungs/Pleura: Elongated band of consolidation extends from the RIGHT hilum superiorly along the oblique fissure this band parenchymal consolidation measures 7 cm x 2.5 cm on coronal image 69/10. At the level the hilum there is a small cavitary portion measuring 1.2 cm. There air bronchograms more peripherally in this band consolidation. In the RIGHT lower lobe there is dense consolidation without air bronchograms. There is filling defect within a RIGHT lower lobe bronchus (image 97/8). Mild consolidated pattern in the LEFT lower lobe to a lesser degree. Upper lungs are relatively clear. Upper Abdomen: Limited view of the liver, kidneys, pancreas are unremarkable. Normal adrenal glands. Musculoskeletal: No aggressive osseous lesion. Review of the MIP images confirms the above findings. IMPRESSION: 1. No evidence acute pulmonary embolism. 2. Dense consolidation in the RIGHT lower lobe with filling of a RIGHT lower lobe bronchus. Findings suggest bronchopneumonia within the  RIGHT lower lobe. Filling defects in the RIGHT lobe bronchus favored mucous plugging but cannot exclude neoplasm or infection. 3. Band of consolidation along the RIGHT oblique fissure is most suggestive of pulmonary infection, again cannot exclude neoplasm. Recommend follow-up imaging 4. Mild consolidation in the LEFT lower lobe also suggesting bronchopneumonia versus aspiration pneumonia. Electronically Signed   By: Genevive Bi M.D.   On: 05/05/2021 11:00    Medications:    lidocaine  2 patch Transdermal Q24H   scopolamine  1 patch Transdermal Q72H   Continuous Infusions:     LOS: 4 days   Joseph Art  [facility]   How to contact the [facility] Attending or Consulting provider 7A - 7P or covering provider during after hours 7P -7A, for this patient?  Check the care team in [facility] and look for a) attending/consulting [facility] provider listed and b) the [facility] team listed Log into [email or web address] and use [place]'s universal password to access. If you do not have the password, please contact the hospital operator. Locate the [facility] provider you are looking for under [facility] and page to a number that you can be directly reached. If you still have difficulty reaching the provider, please page the [facility] (Director on Call) for the Hospitalists listed on amion for assistance.  04/16/2021, 9:34 AM

## 2021-05-13 DEATH — deceased

## 2021-05-18 ENCOUNTER — Ambulatory Visit: Payer: Medicare Other | Admitting: Orthopedic Surgery

## 2021-05-18 ENCOUNTER — Telehealth: Payer: Self-pay | Admitting: *Deleted

## 2021-05-18 NOTE — Telephone Encounter (Signed)
Judeth Cornfield, daughter called and stated that She needs a correction on her FMLA form.   Part C: Intermittent Leave  Start Date: 05/02/2021 End Date 04/23/2021  Frequency 2 day/week  Duration 12 hours/day   Printed form and placed in Amy's folder to review and correct.

## 2021-05-18 NOTE — Telephone Encounter (Signed)
Paperwork updated.

## 2021-05-19 NOTE — Telephone Encounter (Signed)
Paperwork Faxed to Fax: [phone]

## 2021-07-26 ENCOUNTER — Ambulatory Visit: Payer: Medicare Other | Admitting: Internal Medicine

## 2021-10-05 ENCOUNTER — Ambulatory Visit: Payer: Medicare Other | Admitting: Physician Assistant

## 2023-01-31 IMAGING — RF DG ESOPHAGUS
4 series · 10 of 10 positions shown · non-contrast
Comparison: None.

CLINICAL DATA: [AGE] female with history of dysphagia.

EXAM:
ESOPHOGRAM/BARIUM SWALLOW
TECHNIQUE: Single contrast examination was performed using  thin barium.
FLUOROSCOPY TIME:  Fluoroscopy Time:  54 seconds
Radiation Exposure Index (if provided by the fluoroscopic device):
1.2 mGy

[Series 1: cp_standard · 0.51mm/px · 4 of 68 frames shown (1 of 2)]
[frame 11/68]
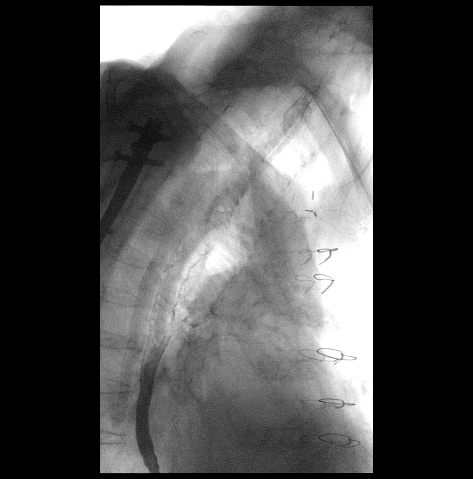
[frame 14/68]
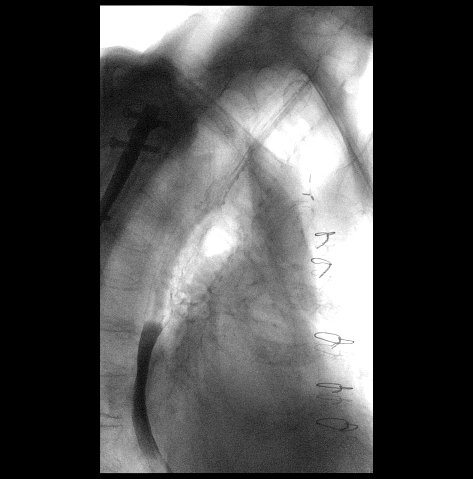
[frame 35/68]
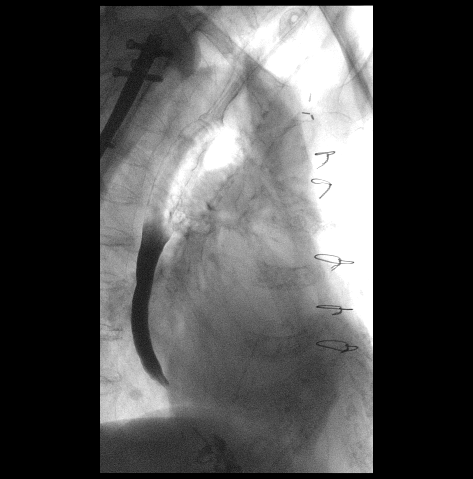
[frame 58/68]
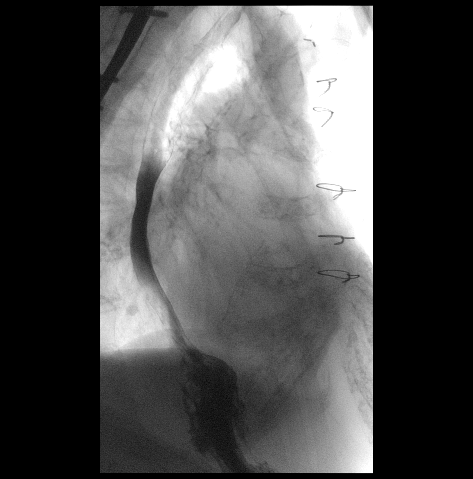

[Series 2: cp_standard · 0.51mm/px · 4 of 57 frames shown (2 of 2)]
[frame 9/57]
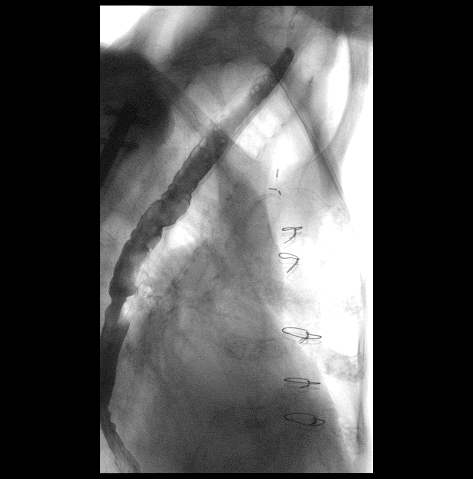
[frame 26/57]
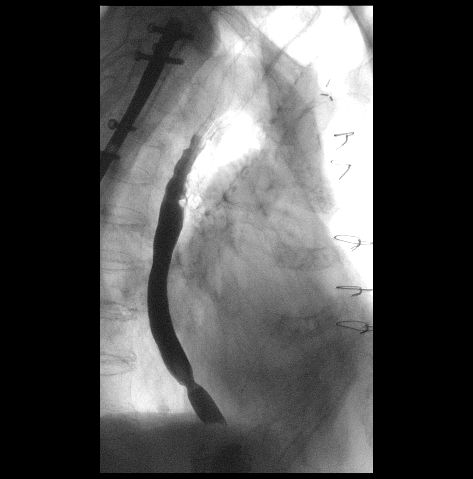
[frame 29/57]
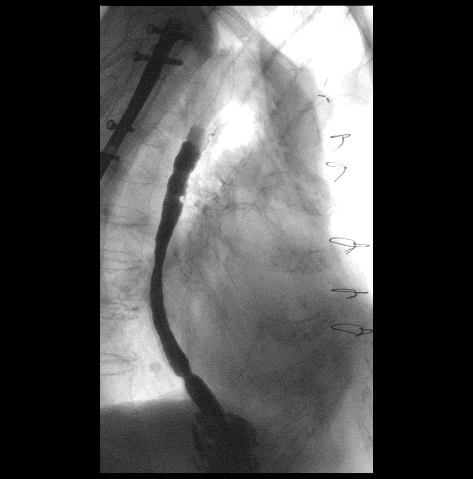
[frame 49/57]
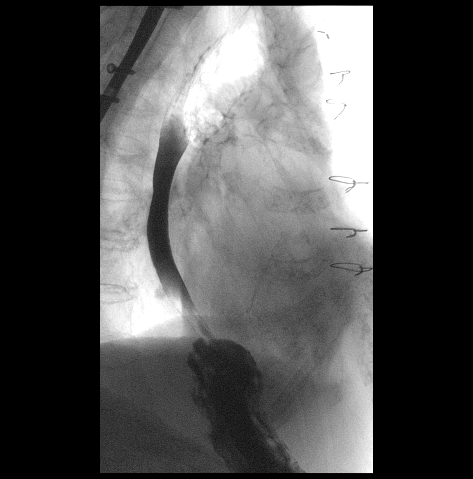

[Series 3: fluoro_barium 2fps_bw · 0.17mm/px · 1 of 1 slices shown (1 of 2)]
[im 1/1]
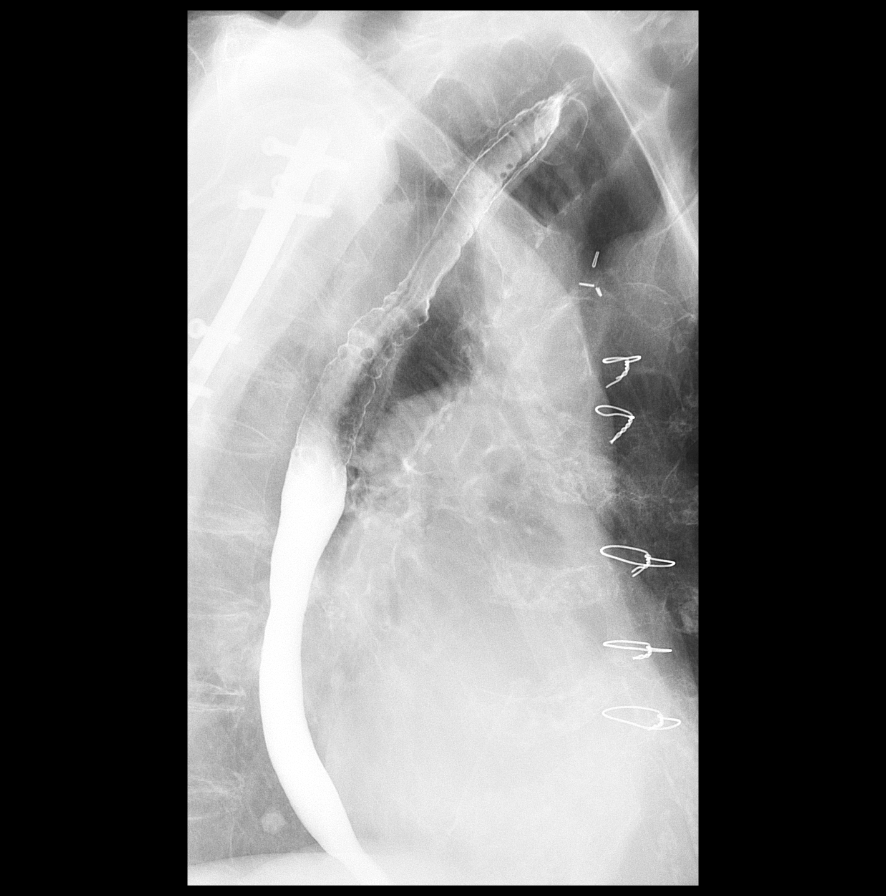

[Series 4: fluoro_barium 2fps_bw · 0.17mm/px · 1 of 1 slices shown (2 of 2)]
[im 1/1]
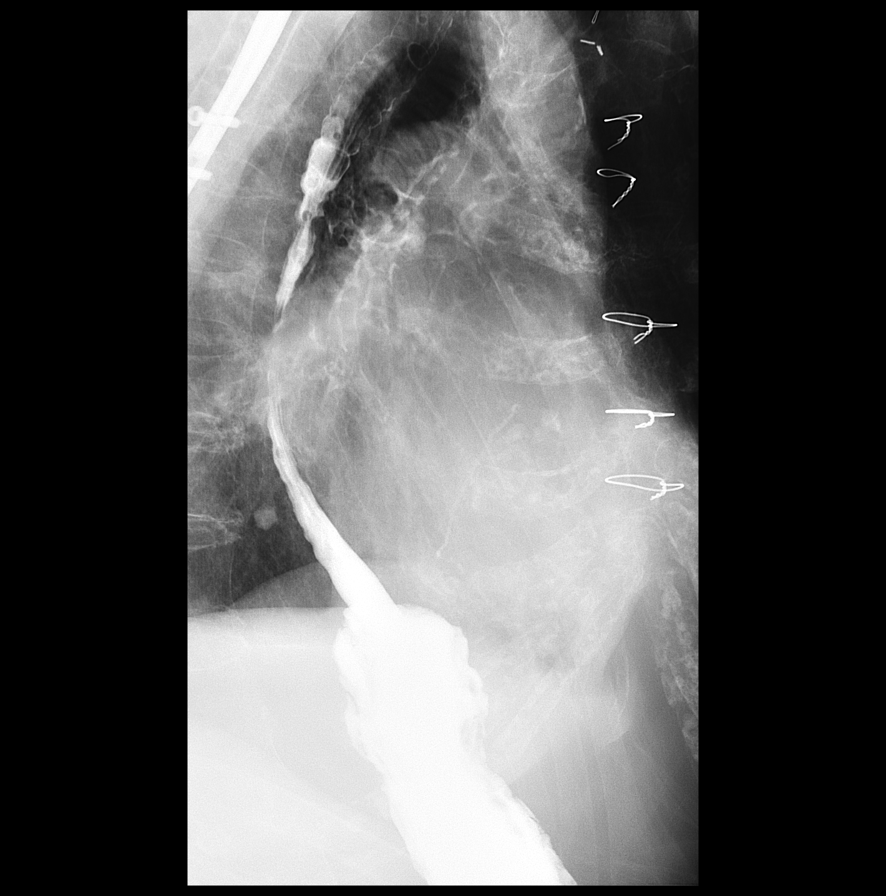

[10 of 10 positions shown; findings below may reference images not displayed]

FINDINGS: Limited single contrast imaging was performed due to lack of patient
mobility. There was intermittent failure to fully propagate primary
peristaltic waves. Extensive tertiary contractions were noted.
Within the limitations of today's single contrast examination, no
definite esophageal mass, stricture or esophageal ring was noted. No
hiatal hernia.
IMPRESSION: 1. Limited single contrast examination demonstrates esophageal
motility disorder with extensive tertiary contractions.
2. Structurally normal esophagus within the limitations of today's
single contrast examination.
